# Patient Record
Sex: Male | Born: 1960 | Race: White | Hispanic: No | Marital: Married | Smoking: Never smoker
Health system: Southern US, Community
[De-identification: ages and names within clinical notes are randomized; demographics above are authoritative.]

## PROBLEM LIST (undated history)

## (undated) DIAGNOSIS — I10 Essential (primary) hypertension: Secondary | ICD-10-CM

## (undated) DIAGNOSIS — E119 Type 2 diabetes mellitus without complications: Secondary | ICD-10-CM

## (undated) DIAGNOSIS — M1990 Unspecified osteoarthritis, unspecified site: Secondary | ICD-10-CM

## (undated) DIAGNOSIS — M21371 Foot drop, right foot: Secondary | ICD-10-CM

## (undated) DIAGNOSIS — Z973 Presence of spectacles and contact lenses: Secondary | ICD-10-CM

## (undated) DIAGNOSIS — G629 Polyneuropathy, unspecified: Secondary | ICD-10-CM

## (undated) HISTORY — PX: EYE SURGERY: SHX253

## (undated) HISTORY — PX: COLONOSCOPY W/ BIOPSIES AND POLYPECTOMY: SHX1376

## (undated) HISTORY — PX: HAMMER TOE SURGERY: SHX385

## (undated) HISTORY — PX: MULTIPLE TOOTH EXTRACTIONS: SHX2053

---

## 1994-12-15 HISTORY — PX: KNEE ARTHROSCOPY: SUR90

## 2001-05-10 ENCOUNTER — Encounter: Admission: RE | Admit: 2001-05-10 | Discharge: 2001-05-10 | Payer: Self-pay | Admitting: Internal Medicine

## 2001-05-10 ENCOUNTER — Encounter: Payer: Self-pay | Admitting: Orthopedic Surgery

## 2004-03-19 ENCOUNTER — Encounter: Admission: RE | Admit: 2004-03-19 | Discharge: 2004-06-17 | Payer: Self-pay | Admitting: Family Medicine

## 2004-06-25 ENCOUNTER — Encounter: Admission: RE | Admit: 2004-06-25 | Discharge: 2004-06-25 | Payer: Self-pay | Admitting: Family Medicine

## 2007-12-16 HISTORY — PX: BACK SURGERY: SHX140

## 2008-05-17 ENCOUNTER — Encounter: Admission: RE | Admit: 2008-05-17 | Discharge: 2008-05-17 | Payer: Self-pay | Admitting: Family Medicine

## 2008-07-10 ENCOUNTER — Encounter: Admission: RE | Admit: 2008-07-10 | Discharge: 2008-07-10 | Payer: Self-pay | Admitting: Orthopaedic Surgery

## 2008-09-11 ENCOUNTER — Inpatient Hospital Stay (HOSPITAL_COMMUNITY): Admission: RE | Admit: 2008-09-11 | Discharge: 2008-09-14 | Payer: Self-pay | Admitting: Orthopaedic Surgery

## 2008-12-15 HISTORY — PX: CHOLECYSTECTOMY: SHX55

## 2010-04-20 ENCOUNTER — Inpatient Hospital Stay (HOSPITAL_COMMUNITY): Admission: EM | Admit: 2010-04-20 | Discharge: 2010-05-07 | Payer: Self-pay | Admitting: Emergency Medicine

## 2010-05-20 ENCOUNTER — Encounter: Admission: RE | Admit: 2010-05-20 | Discharge: 2010-05-20 | Payer: Self-pay | Admitting: General Surgery

## 2010-06-12 ENCOUNTER — Ambulatory Visit (HOSPITAL_COMMUNITY): Admission: RE | Admit: 2010-06-12 | Discharge: 2010-06-15 | Payer: Self-pay | Admitting: General Surgery

## 2010-06-12 ENCOUNTER — Encounter (INDEPENDENT_AMBULATORY_CARE_PROVIDER_SITE_OTHER): Payer: Self-pay | Admitting: General Surgery

## 2011-03-02 LAB — GLUCOSE, CAPILLARY
Glucose-Capillary: 102 mg/dL — ABNORMAL HIGH (ref 70–99)
Glucose-Capillary: 155 mg/dL — ABNORMAL HIGH (ref 70–99)
Glucose-Capillary: 182 mg/dL — ABNORMAL HIGH (ref 70–99)
Glucose-Capillary: 205 mg/dL — ABNORMAL HIGH (ref 70–99)
Glucose-Capillary: 210 mg/dL — ABNORMAL HIGH (ref 70–99)
Glucose-Capillary: 93 mg/dL (ref 70–99)
Glucose-Capillary: 127 mg/dL — ABNORMAL HIGH (ref 70–99)
Glucose-Capillary: 165 mg/dL — ABNORMAL HIGH (ref 70–99)
Glucose-Capillary: 168 mg/dL — ABNORMAL HIGH (ref 70–99)
Glucose-Capillary: 170 mg/dL — ABNORMAL HIGH (ref 70–99)
Glucose-Capillary: 176 mg/dL — ABNORMAL HIGH (ref 70–99)
Glucose-Capillary: 197 mg/dL — ABNORMAL HIGH (ref 70–99)
Glucose-Capillary: 200 mg/dL — ABNORMAL HIGH (ref 70–99)
Glucose-Capillary: 201 mg/dL — ABNORMAL HIGH (ref 70–99)
Glucose-Capillary: 214 mg/dL — ABNORMAL HIGH (ref 70–99)
Glucose-Capillary: 227 mg/dL — ABNORMAL HIGH (ref 70–99)
Glucose-Capillary: 229 mg/dL — ABNORMAL HIGH (ref 70–99)

## 2011-03-02 LAB — COMPREHENSIVE METABOLIC PANEL WITH GFR
ALT: 13 U/L (ref 0–53)
ALT: 16 U/L (ref 0–53)
AST: 19 U/L (ref 0–37)
AST: 21 U/L (ref 0–37)
Albumin: 3 g/dL — ABNORMAL LOW (ref 3.5–5.2)
Albumin: 3.5 g/dL (ref 3.5–5.2)
Alkaline Phosphatase: 101 U/L (ref 39–117)
Alkaline Phosphatase: 73 U/L (ref 39–117)
BUN: 11 mg/dL (ref 6–23)
BUN: 11 mg/dL (ref 6–23)
CO2: 24 meq/L (ref 19–32)
CO2: 27 meq/L (ref 19–32)
Calcium: 8.5 mg/dL (ref 8.4–10.5)
Calcium: 9.6 mg/dL (ref 8.4–10.5)
Chloride: 103 meq/L (ref 96–112)
Chloride: 99 meq/L (ref 96–112)
Creatinine, Ser: 0.91 mg/dL (ref 0.4–1.5)
Creatinine, Ser: 0.99 mg/dL (ref 0.4–1.5)
GFR calc Af Amer: >60 mL/min (ref >60)
GFR calc Af Amer: >60 mL/min (ref >60)
GFR calc non Af Amer: >60 mL/min (ref >60)
GFR calc non Af Amer: >60 mL/min (ref >60)
Glucose, Bld: 149 mg/dL — ABNORMAL HIGH (ref 70–99)
Glucose, Bld: 168 mg/dL — ABNORMAL HIGH (ref 70–99)
Potassium: 3.5 meq/L (ref 3.5–5.1)
Potassium: 4 meq/L (ref 3.5–5.1)
Sodium: 131 meq/L — ABNORMAL LOW (ref 135–145)
Sodium: 139 meq/L (ref 135–145)
Total Bilirubin: 0.6 mg/dL (ref 0.3–1.2)
Total Bilirubin: 1.1 mg/dL (ref 0.3–1.2)
Total Protein: 7 g/dL (ref 6.0–8.3)
Total Protein: 7.7 g/dL (ref 6.0–8.3)

## 2011-03-02 LAB — DIFFERENTIAL
Basophils Absolute: 0.1 K/uL (ref 0.0–0.1)
Basophils Relative: 1 % (ref 0–1)
Eosinophils Absolute: 0.5 K/uL (ref 0.0–0.7)
Eosinophils Relative: 5 % (ref 0–5)
Lymphocytes Relative: 19 % (ref 12–46)
Lymphs Abs: 1.9 K/uL (ref 0.7–4.0)
Monocytes Absolute: 0.8 K/uL (ref 0.1–1.0)
Monocytes Relative: 9 % (ref 3–12)
Neutro Abs: 6.4 K/uL (ref 1.7–7.7)
Neutrophils Relative %: 66 % (ref 43–77)

## 2011-03-02 LAB — CBC
HCT: 36.3 % — ABNORMAL LOW (ref 39.0–52.0)
HCT: 40.7 % (ref 39.0–52.0)
Hemoglobin: 12.1 g/dL — ABNORMAL LOW (ref 13.0–17.0)
Hemoglobin: 12.6 g/dL — ABNORMAL LOW (ref 13.0–17.0)
Hemoglobin: 13.8 g/dL (ref 13.0–17.0)
MCH: 31.7 pg (ref 26.0–34.0)
MCH: 31.1 pg (ref 26.0–34.0)
MCH: 31.5 pg (ref 26.0–34.0)
MCHC: 34.6 g/dL (ref 30.0–36.0)
MCHC: 34 g/dL (ref 30.0–36.0)
MCV: 91.6 fL (ref 78.0–100.0)
MCV: 91.2 fL (ref 78.0–100.0)
MCV: 91.6 fL (ref 78.0–100.0)
Platelets: 245 K/uL (ref 150–400)
Platelets: 280 10*3/uL (ref 150–400)
Platelets: 305 K/uL (ref 150–400)
RBC: 3.87 MIL/uL — ABNORMAL LOW (ref 4.22–5.81)
RBC: 3.96 MIL/uL — ABNORMAL LOW (ref 4.22–5.81)
RBC: 4.44 MIL/uL (ref 4.22–5.81)
RDW: 16.1 % — ABNORMAL HIGH (ref 11.5–15.5)
RDW: 16.1 % — ABNORMAL HIGH (ref 11.5–15.5)
RDW: 15.7 % — ABNORMAL HIGH (ref 11.5–15.5)
RDW: 16.1 % — ABNORMAL HIGH (ref 11.5–15.5)
WBC: 11.1 K/uL — ABNORMAL HIGH (ref 4.0–10.5)
WBC: 12.6 10*3/uL — ABNORMAL HIGH (ref 4.0–10.5)
WBC: 9.6 K/uL (ref 4.0–10.5)

## 2011-03-02 LAB — COMPREHENSIVE METABOLIC PANEL
ALT: 12 U/L (ref 0–53)
Albumin: 2.8 g/dL — ABNORMAL LOW (ref 3.5–5.2)
Calcium: 8.5 mg/dL (ref 8.4–10.5)
Creatinine, Ser: 0.89 mg/dL (ref 0.4–1.5)
GFR calc Af Amer: >60 mL/min (ref >60)
Glucose, Bld: 154 mg/dL — ABNORMAL HIGH (ref 70–99)
Sodium: 136 mEq/L (ref 135–145)
Total Protein: 6.8 g/dL (ref 6.0–8.3)

## 2011-03-03 LAB — GLUCOSE, CAPILLARY
Glucose-Capillary: 101 mg/dL — ABNORMAL HIGH (ref 70–99)
Glucose-Capillary: 115 mg/dL — ABNORMAL HIGH (ref 70–99)
Glucose-Capillary: 121 mg/dL — ABNORMAL HIGH (ref 70–99)
Glucose-Capillary: 129 mg/dL — ABNORMAL HIGH (ref 70–99)
Glucose-Capillary: 136 mg/dL — ABNORMAL HIGH (ref 70–99)
Glucose-Capillary: 139 mg/dL — ABNORMAL HIGH (ref 70–99)
Glucose-Capillary: 147 mg/dL — ABNORMAL HIGH (ref 70–99)
Glucose-Capillary: 150 mg/dL — ABNORMAL HIGH (ref 70–99)
Glucose-Capillary: 170 mg/dL — ABNORMAL HIGH (ref 70–99)
Glucose-Capillary: 173 mg/dL — ABNORMAL HIGH (ref 70–99)
Glucose-Capillary: 183 mg/dL — ABNORMAL HIGH (ref 70–99)
Glucose-Capillary: 66 mg/dL — ABNORMAL LOW (ref 70–99)
Glucose-Capillary: 66 mg/dL — ABNORMAL LOW (ref 70–99)
Glucose-Capillary: 72 mg/dL (ref 70–99)
Glucose-Capillary: 74 mg/dL (ref 70–99)
Glucose-Capillary: 76 mg/dL (ref 70–99)
Glucose-Capillary: 79 mg/dL (ref 70–99)
Glucose-Capillary: 98 mg/dL (ref 70–99)
Glucose-Capillary: 98 mg/dL (ref 70–99)

## 2011-03-03 LAB — CBC
HCT: 28.4 % — ABNORMAL LOW (ref 39.0–52.0)
HCT: 29.6 % — ABNORMAL LOW (ref 39.0–52.0)
HCT: 29.8 % — ABNORMAL LOW (ref 39.0–52.0)
HCT: 30.3 % — ABNORMAL LOW (ref 39.0–52.0)
HCT: 30.6 % — ABNORMAL LOW (ref 39.0–52.0)
Hemoglobin: 10.6 g/dL — ABNORMAL LOW (ref 13.0–17.0)
Hemoglobin: 10.7 g/dL — ABNORMAL LOW (ref 13.0–17.0)
Hemoglobin: 10.7 g/dL — ABNORMAL LOW (ref 13.0–17.0)
Hemoglobin: 9.8 g/dL — ABNORMAL LOW (ref 13.0–17.0)
MCHC: 34.1 g/dL (ref 30.0–36.0)
MCHC: 34.8 g/dL (ref 30.0–36.0)
MCV: 91.4 fL (ref 78.0–100.0)
MCV: 91.9 fL (ref 78.0–100.0)
MCV: 92 fL (ref 78.0–100.0)
MCV: 92.8 fL (ref 78.0–100.0)
MCV: 93.1 fL (ref 78.0–100.0)
Platelets: 348 10*3/uL (ref 150–400)
Platelets: 444 10*3/uL — ABNORMAL HIGH (ref 150–400)
Platelets: 444 10*3/uL — ABNORMAL HIGH (ref 150–400)
Platelets: 448 10*3/uL — ABNORMAL HIGH (ref 150–400)
Platelets: 504 10*3/uL — ABNORMAL HIGH (ref 150–400)
RBC: 3.09 MIL/uL — ABNORMAL LOW (ref 4.22–5.81)
RBC: 3.3 MIL/uL — ABNORMAL LOW (ref 4.22–5.81)
RBC: 3.39 MIL/uL — ABNORMAL LOW (ref 4.22–5.81)
RDW: 14.1 % (ref 11.5–15.5)
RDW: 14.1 % (ref 11.5–15.5)
RDW: 14.2 % (ref 11.5–15.5)
RDW: 14.3 % (ref 11.5–15.5)
WBC: 13.1 10*3/uL — ABNORMAL HIGH (ref 4.0–10.5)
WBC: 15.9 10*3/uL — ABNORMAL HIGH (ref 4.0–10.5)
WBC: 22.5 10*3/uL — ABNORMAL HIGH (ref 4.0–10.5)
WBC: 24.9 10*3/uL — ABNORMAL HIGH (ref 4.0–10.5)
WBC: 25.2 10*3/uL — ABNORMAL HIGH (ref 4.0–10.5)

## 2011-03-03 LAB — ANAEROBIC CULTURE

## 2011-03-03 LAB — BASIC METABOLIC PANEL
BUN: 2 mg/dL — ABNORMAL LOW (ref 6–23)
Calcium: 8 mg/dL — ABNORMAL LOW (ref 8.4–10.5)
Creatinine, Ser: 0.77 mg/dL (ref 0.4–1.5)
GFR calc non Af Amer: >60 mL/min (ref >60)
Glucose, Bld: 87 mg/dL (ref 70–99)
Potassium: 3.5 mEq/L (ref 3.5–5.1)

## 2011-03-03 LAB — CULTURE, ROUTINE-ABSCESS: Culture: NO GROWTH

## 2011-03-03 LAB — DIFFERENTIAL
Basophils Absolute: 0.2 10*3/uL — ABNORMAL HIGH (ref 0.0–0.1)
Eosinophils Absolute: 0.3 10*3/uL (ref 0.0–0.7)
Eosinophils Relative: 3 % (ref 0–5)
Lymphs Abs: 1.5 10*3/uL (ref 0.7–4.0)
Monocytes Absolute: 1.1 10*3/uL — ABNORMAL HIGH (ref 0.1–1.0)

## 2011-03-04 LAB — GLUCOSE, CAPILLARY
Glucose-Capillary: 139 mg/dL — ABNORMAL HIGH (ref 70–99)
Glucose-Capillary: 162 mg/dL — ABNORMAL HIGH (ref 70–99)
Glucose-Capillary: 163 mg/dL — ABNORMAL HIGH (ref 70–99)
Glucose-Capillary: 179 mg/dL — ABNORMAL HIGH (ref 70–99)
Glucose-Capillary: 180 mg/dL — ABNORMAL HIGH (ref 70–99)
Glucose-Capillary: 182 mg/dL — ABNORMAL HIGH (ref 70–99)
Glucose-Capillary: 188 mg/dL — ABNORMAL HIGH (ref 70–99)
Glucose-Capillary: 188 mg/dL — ABNORMAL HIGH (ref 70–99)
Glucose-Capillary: 189 mg/dL — ABNORMAL HIGH (ref 70–99)
Glucose-Capillary: 190 mg/dL — ABNORMAL HIGH (ref 70–99)
Glucose-Capillary: 192 mg/dL — ABNORMAL HIGH (ref 70–99)
Glucose-Capillary: 193 mg/dL — ABNORMAL HIGH (ref 70–99)
Glucose-Capillary: 207 mg/dL — ABNORMAL HIGH (ref 70–99)
Glucose-Capillary: 209 mg/dL — ABNORMAL HIGH (ref 70–99)
Glucose-Capillary: 214 mg/dL — ABNORMAL HIGH (ref 70–99)
Glucose-Capillary: 272 mg/dL — ABNORMAL HIGH (ref 70–99)
Glucose-Capillary: 294 mg/dL — ABNORMAL HIGH (ref 70–99)
Glucose-Capillary: 302 mg/dL — ABNORMAL HIGH (ref 70–99)
Glucose-Capillary: 303 mg/dL — ABNORMAL HIGH (ref 70–99)
Glucose-Capillary: 343 mg/dL — ABNORMAL HIGH (ref 70–99)
Glucose-Capillary: 375 mg/dL — ABNORMAL HIGH (ref 70–99)

## 2011-03-04 LAB — CBC
HCT: 32.7 % — ABNORMAL LOW (ref 39.0–52.0)
HCT: 33.6 % — ABNORMAL LOW (ref 39.0–52.0)
Hemoglobin: 11.1 g/dL — ABNORMAL LOW (ref 13.0–17.0)
Hemoglobin: 11.4 g/dL — ABNORMAL LOW (ref 13.0–17.0)
Hemoglobin: 11.5 g/dL — ABNORMAL LOW (ref 13.0–17.0)
Hemoglobin: 11.9 g/dL — ABNORMAL LOW (ref 13.0–17.0)
Hemoglobin: 13 g/dL (ref 13.0–17.0)
Hemoglobin: 13 g/dL (ref 13.0–17.0)
MCHC: 34 g/dL (ref 30.0–36.0)
MCHC: 34.3 g/dL (ref 30.0–36.0)
MCHC: 34.3 g/dL (ref 30.0–36.0)
MCHC: 34.5 g/dL (ref 30.0–36.0)
MCHC: 34.7 g/dL (ref 30.0–36.0)
MCHC: 34.8 g/dL (ref 30.0–36.0)
MCV: 92.3 fL (ref 78.0–100.0)
MCV: 92.8 fL (ref 78.0–100.0)
MCV: 93.1 fL (ref 78.0–100.0)
MCV: 93.4 fL (ref 78.0–100.0)
MCV: 94.3 fL (ref 78.0–100.0)
MCV: 94.5 fL (ref 78.0–100.0)
Platelets: 297 10*3/uL (ref 150–400)
Platelets: 669 10*3/uL — ABNORMAL HIGH (ref 150–400)
RBC: 3.6 MIL/uL — ABNORMAL LOW (ref 4.22–5.81)
RBC: 3.71 MIL/uL — ABNORMAL LOW (ref 4.22–5.81)
RBC: 3.77 MIL/uL — ABNORMAL LOW (ref 4.22–5.81)
RBC: 3.98 MIL/uL — ABNORMAL LOW (ref 4.22–5.81)
RBC: 4.02 MIL/uL — ABNORMAL LOW (ref 4.22–5.81)
RBC: 4.35 MIL/uL (ref 4.22–5.81)
RDW: 13.5 % (ref 11.5–15.5)
RDW: 13.7 % (ref 11.5–15.5)
RDW: 13.8 % (ref 11.5–15.5)
RDW: 13.9 % (ref 11.5–15.5)
WBC: 25.4 10*3/uL — ABNORMAL HIGH (ref 4.0–10.5)
WBC: 38.2 10*3/uL — ABNORMAL HIGH (ref 4.0–10.5)
WBC: 44.5 10*3/uL — ABNORMAL HIGH (ref 4.0–10.5)

## 2011-03-04 LAB — DIFFERENTIAL
Basophils Absolute: 0.4 10*3/uL — ABNORMAL HIGH (ref 0.0–0.1)
Eosinophils Absolute: 0 10*3/uL (ref 0.0–0.7)
Lymphs Abs: 1.1 10*3/uL (ref 0.7–4.0)
Neutro Abs: 35.2 10*3/uL — ABNORMAL HIGH (ref 1.7–7.7)
WBC Morphology: INCREASED

## 2011-03-04 LAB — COMPREHENSIVE METABOLIC PANEL
ALT: 19 U/L (ref 0–53)
ALT: 24 U/L (ref 0–53)
ALT: 26 U/L (ref 0–53)
ALT: 29 U/L (ref 0–53)
AST: 18 U/L (ref 0–37)
AST: 26 U/L (ref 0–37)
AST: 30 U/L (ref 0–37)
Alkaline Phosphatase: 113 U/L (ref 39–117)
Alkaline Phosphatase: 125 U/L — ABNORMAL HIGH (ref 39–117)
BUN: 25 mg/dL — ABNORMAL HIGH (ref 6–23)
CO2: 21 mEq/L (ref 19–32)
CO2: 24 mEq/L (ref 19–32)
CO2: 24 mEq/L (ref 19–32)
CO2: 24 mEq/L (ref 19–32)
Calcium: 8.3 mg/dL — ABNORMAL LOW (ref 8.4–10.5)
Calcium: 8.3 mg/dL — ABNORMAL LOW (ref 8.4–10.5)
Calcium: 8.4 mg/dL (ref 8.4–10.5)
Chloride: 94 mEq/L — ABNORMAL LOW (ref 96–112)
Chloride: 95 mEq/L — ABNORMAL LOW (ref 96–112)
Creatinine, Ser: 1.27 mg/dL (ref 0.4–1.5)
Creatinine, Ser: 2.59 mg/dL — ABNORMAL HIGH (ref 0.4–1.5)
GFR calc Af Amer: 32 mL/min — ABNORMAL LOW (ref >60)
GFR calc Af Amer: 47 mL/min — ABNORMAL LOW (ref >60)
GFR calc Af Amer: >60 mL/min (ref >60)
GFR calc non Af Amer: 24 mL/min — ABNORMAL LOW (ref >60)
GFR calc non Af Amer: 26 mL/min — ABNORMAL LOW (ref >60)
GFR calc non Af Amer: >60 mL/min (ref >60)
GFR calc non Af Amer: >60 mL/min (ref >60)
Glucose, Bld: 179 mg/dL — ABNORMAL HIGH (ref 70–99)
Glucose, Bld: 212 mg/dL — ABNORMAL HIGH (ref 70–99)
Glucose, Bld: 328 mg/dL — ABNORMAL HIGH (ref 70–99)
Glucose, Bld: 433 mg/dL — ABNORMAL HIGH (ref 70–99)
Potassium: 3.8 mEq/L (ref 3.5–5.1)
Potassium: 4.5 mEq/L (ref 3.5–5.1)
Sodium: 131 mEq/L — ABNORMAL LOW (ref 135–145)
Sodium: 131 mEq/L — ABNORMAL LOW (ref 135–145)
Sodium: 132 mEq/L — ABNORMAL LOW (ref 135–145)
Sodium: 137 mEq/L (ref 135–145)
Sodium: 139 mEq/L (ref 135–145)
Total Bilirubin: 1.3 mg/dL — ABNORMAL HIGH (ref 0.3–1.2)
Total Protein: 6.4 g/dL (ref 6.0–8.3)
Total Protein: 6.6 g/dL (ref 6.0–8.3)
Total Protein: 7.1 g/dL (ref 6.0–8.3)
Total Protein: 7.2 g/dL (ref 6.0–8.3)

## 2011-03-04 LAB — BASIC METABOLIC PANEL
BUN: 7 mg/dL (ref 6–23)
CO2: 24 mEq/L (ref 19–32)
CO2: 24 mEq/L (ref 19–32)
CO2: 25 mEq/L (ref 19–32)
Chloride: 101 mEq/L (ref 96–112)
Chloride: 104 mEq/L (ref 96–112)
Chloride: 105 mEq/L (ref 96–112)
Chloride: 106 mEq/L (ref 96–112)
Creatinine, Ser: 0.9 mg/dL (ref 0.4–1.5)
Creatinine, Ser: 1 mg/dL (ref 0.4–1.5)
Creatinine, Ser: 1.95 mg/dL — ABNORMAL HIGH (ref 0.4–1.5)
GFR calc Af Amer: 44 mL/min — ABNORMAL LOW (ref >60)
GFR calc Af Amer: >60 mL/min (ref >60)
GFR calc non Af Amer: >60 mL/min (ref >60)
GFR calc non Af Amer: >60 mL/min (ref >60)
Glucose, Bld: 161 mg/dL — ABNORMAL HIGH (ref 70–99)
Glucose, Bld: 205 mg/dL — ABNORMAL HIGH (ref 70–99)
Potassium: 3.4 mEq/L — ABNORMAL LOW (ref 3.5–5.1)
Potassium: 3.4 mEq/L — ABNORMAL LOW (ref 3.5–5.1)
Sodium: 133 mEq/L — ABNORMAL LOW (ref 135–145)
Sodium: 134 mEq/L — ABNORMAL LOW (ref 135–145)
Sodium: 137 mEq/L (ref 135–145)

## 2011-03-04 LAB — CLOSTRIDIUM DIFFICILE EIA
C difficile Toxins A+B, EIA: NEGATIVE
C difficile Toxins A+B, EIA: NEGATIVE

## 2011-03-04 LAB — URINALYSIS, ROUTINE W REFLEX MICROSCOPIC
Glucose, UA: 500 mg/dL — AB
Hgb urine dipstick: NEGATIVE
Ketones, ur: 15 mg/dL — AB
Leukocytes, UA: NEGATIVE
Protein, ur: 100 mg/dL — AB
Protein, ur: 100 mg/dL — AB
Urobilinogen, UA: 1 mg/dL (ref 0.0–1.0)
Urobilinogen, UA: 1 mg/dL (ref 0.0–1.0)

## 2011-03-04 LAB — URINE MICROSCOPIC-ADD ON

## 2011-03-04 LAB — HEMOGLOBIN A1C
Hgb A1c MFr Bld: 7.8 % — ABNORMAL HIGH (ref <5.7)
Mean Plasma Glucose: 177 mg/dL — ABNORMAL HIGH (ref <117)

## 2011-03-04 LAB — ANAEROBIC CULTURE: Gram Stain: NONE SEEN

## 2011-03-04 LAB — MAGNESIUM
Magnesium: 1.4 mg/dL — ABNORMAL LOW (ref 1.5–2.5)
Magnesium: 1.6 mg/dL (ref 1.5–2.5)

## 2011-03-04 LAB — URINE CULTURE

## 2011-03-04 LAB — CULTURE, ROUTINE-ABSCESS

## 2011-04-29 NOTE — Op Note (Signed)
[name], [name]                 ACCOUNT NO.:  [id number]   MEDICAL RECORD NO.:  [id number]          PATIENT TYPE:  INP   LOCATION:  5036                         FACILITY:  [facility]   PHYSICIAN:  Mark C. Ophelia Charter, M.D.    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   PROCEDURE:  L3-4 left T-lift with pedicle instrumentation, interbody  bone 11-mm PEEK cage with local bone, bilateral gutter fusion, pedicle  instrumentation, and Vitoss with iliac crest aspirate bone marrow.   SURGEON:  Mark C. Ophelia Charter, MD   ASSISTANT:  Wende Neighbors, PA   ANESTHESIA:  GOT.   ESTIMATED BLOOD LOSS:  300 mL.   DRAINS:  None.   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  He had a large HNP central with foraminal  severe compression bilaterally and the facet spurring.   PROCEDURE:  After induction of general anesthesia, the patient was  placed prone on the spine frame.  With careful padding and positioning  in prone position, rolled doubly yellow eggcrate pads underneath the  shoulders, with the hip pads and iliac crest pads.  Foley catheter had  been placed and foot pumpers were used.  The area posteriorly on the  thigh was noted which was an old boil.  No purulence was expressed;  however, the patient received Ancef prophylactically.  It was elected to  go ahead and give him a dose of 1.5 vancomycin possibly that he may have  had exposure of MRSA at some point in the past with the boil.  Back was  prepped with DuraPrep and scrubbed with  towels.  Betadine bio drape  application.  Cross-table lateral x-ray with needle localization with 2  needles with the inferior needle at the level of L3-4.  Incision was  made in the midline, subperiosteal dissection was performed at the  facets.  C-arm was draped and brought in and Kochers were placed for the  planned level and decompression adjusted and spot fluoro picture showing  confirmation.  Posterior elements were removed and laminectomy was  performed at L3, top of L4 was taken.  Piece of the bone were  meticulously cleaned and cut into small pieces for later bone graft with  the T-lift.  There was narrow pedicles as expected and extremely tight  compression with thick chunks of ligament and overhanging bone removed.  Once the dura was rounded up and the area of compression were improved,  dissection now on the left side with spot fluoro picture confirming the  level of the disk, removing the facets on the left, and some coagulation  of epidural veins.  There was large __________ endplate spurs and a  large  bulging disk and large chunks of ligament were decompressed, all  was scarred down to the dura and abstains and operative microscope was  used with microdissection until the disk material was pushed into the  disk space gently and then removed.  This allowed the dura to move more  freely almost to the midline.  Thick chunks of ligaments were removed,  ring curettes, right and left angled curettes, endplate rasps, large  wide curettes, and straight curettes were all used for preparation of  the endplates.  After endplates were bleeding, bone was meticulously  packed anteriorly and then tapped down and packed with a hammer and  impacted with a 7-mm trial.  Trials 7, 9, and 11 mm were used and 11-mm  lordotic PEEK cage was packed to the bone in the middle of the cage and  then inserted.  On lateral x-ray, it was in excellent position.  There  was abundant bone anterior to the  cage.  On AP fluoroscopic picture, the  cage appeared to be a little bit to the right side; however, with direct  visualization it was still hanging out on the left side with portion of  the cage exposed lateral to the dura.  Nerve root just above the level  of disk was well decompressed.  Pedicle screws were then placed, 6.5 mm  using the Bi[employer] Polaris system.  Spike pedicle starter was used  followed by fluoroscopic check then the joystick pedicle filler,  followed by fluoroscopic spot picture, followed by a ball-tip probe to  probe the pedicle, followed by tapping repeat ball-tip probing, and then  placement of the screw then repeat fluoroscopic picture confirming good  position.  This was repeated all 4 times for all screws.  A 45-mm  titanium rod was selected, tightened down on the T-lift side first with  good compression on the opposite side.  AP and lateral spot fluoro  pictures confirmed good position of the rod.  Rod sticking out least 1  mm on all lance.  There was good compression.  The endplates were  parallel, cage was in good position, and bone graft was visualized in  the interbody.  Prior to placement of the screws, transverse processes  were decorticated.  Iliac aspirate was performed from stab 15 incision  on the right posterior iliac crest aspirating 10 mL moving the tip round  on the bone to obtain maximal number of ostial progenitor cells.  Vitoss  10 mL strip was selected, cut in half, packed on both gutters for  intertransverse process fusion with the remaining chips of the patient's  own local bone.  Instrument count and needle count was correct and  padding count was correct.  Deep fascia was closed with 0 Vicryl, 2-0  Vicryl in subcutaneous tissue, 4-0 Vicryl subcuticular closure, postop  dressing, and transferred to recovery room.      Mark C. Ophelia Charter, M.D.  Electronically Signed     MCY/MEDQ  D:  09/11/2008  T:  09/12/2008  Job:  [id number]

## 2011-05-02 NOTE — Discharge Summary (Signed)
[name], [name]                 ACCOUNT NO.:  [id number]   MEDICAL RECORD NO.:  [id number]          PATIENT TYPE:  INP   LOCATION:  5036                         FACILITY:  [facility]   PHYSICIAN:  Mark C. Ophelia Charter, M.D.    DATE OF BIRTH:  Apr 23, 1961   DATE OF ADMISSION:  09/11/2008  DATE OF DISCHARGE:  09/14/2008                               DISCHARGE SUMMARY   ADMISSION DIAGNOSES:  1. 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  2. Diabetes mellitus.  3. Hypertension.  4. Dyslipidemia.   DISCHARGE DIAGNOSES:  1. 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  2. Diabetes mellitus.  3. Hypertension.  4. Dyslipidemia.  5. Mild posthemorrhagic anemia.  6. Elevated hemoglobin A1c at 6.3.   PROCEDURE:  On September 11, 2008, the patient underwent left L3-4  transforaminal lumbar interbody fusion with pedicle screw and rod  instrumentation with iliac crest bone marrow aspirate.  This was  performed by Dr. Ophelia Charter, assisted by Maud Deed, PA-C under general  anesthesia.   CONSULTATIONS:  None.   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  He has undergone  evaluation with radiograph showing congenitally short pedicles with  acquired and congenital stenosis combined with myelographic block at the  L3-4 level and mild stenosis at L4-5.  Also, he has a large herniated  nucleus pulposus central and foraminal with severe compression  bilaterally and facets spurring at the L3-4 level.  As his symptoms are  worsening and he is getting no relief with conservative treatment, it  was felt he would require surgical intervention and was admitted for the  procedure as stated above.   BRIEF HOSPITAL COURSE:  The patient tolerated the procedure under  general anesthesia.  Postoperatively, neurovascular  motor function was  noted to be intact.  The patient was fitted with the brace and started  physical therapy on the first postoperative day.  He was allowed  weightbearing as tolerated, utilizing a walker, and was instructed to  wear the brace at all times when out of bed.  Occupational therapy saw  the patient and assisted with the durable medical equipment and  recommended home health occupational therapy.  Physical therapy worked  with the patient daily and he was able to ambulate as much as 120 feet  utilizing a walker during the hospital stay.  Dressing change was done  daily.  He did have a small amount of serous drainage from his wound.  This was dressed with Mepilex and he was given instructions to keep the  wound dry and clean until all drainage had resolved.  The patient was  able to go up and down stairs.  Prior to discharge, he was taking a  regular diet.  He was voiding without difficulty.  The patient had one  temperature spike to 101.3 that returned to afebrile status.  He was  instructed in coughing and deep breathing.  The patient's blood sugars  were monitored and he was on sliding scale insulin and returned to his  usual home medications prior to discharge.   PERTINENT LABORATORY VALUES:  Admission CBC with hemoglobin 14.9,  hematocrit 43.6.  At discharge, hemoglobin 12.6, hematocrit 36.3.  Admission blood glucose was 204.  Blood sugar was monitored throughout  the hospital stay with values ranging from 304 to 131.  It was noted  that his hemoglobin A1c was elevated at 6.3.  The patient was advised to  see his primary care physician in regards to adjustments in his  medications for his diabetes and also instructed in low carbohydrate  diet.  At the time of discharge, he was afebrile, vital signs were  stable.  He was discharged to his home.   PLAN:  Arrangements were made for home health physical therapy and  occupational therapy as well as durable medical equipment.   The patient  was instructed to resume his home medications as taken prior to  admission and was given a medication reconciliation form in regards to  this.  He was asked to discontinue the use of ibuprofen.  Medications  include Percocet 5/325 one to two every for 6 hours as needed for pain,  Robaxin 500 mg one every 8 hours as needed for spasm.  He will use over-  the-counter stool softeners daily and suppository laxative as needed for  constipation.  He is instructed to wear his brace at all times when out  of bed.  He will call if his temperature was greater than 101.  He will  continue to use his incentive spirometer at home.  Ice packs to be used  to his low back as needed.  He will keep his wound dry and clean until  all drainage had subsided.  The patient will follow up with Dr. Ophelia Charter in  1 week.  He will continue on a diabetic diet and he was encouraged to  drink as much as 60 ounces of water per day.  He will call if there are  questions or concerns prior to his return office visit.  All questions  encouraged and answered.      Wende Neighbors, P.A.      Mark C. Ophelia Charter, M.D.  Electronically Signed    SMV/MEDQ  D:  10/12/2008  T:  10/12/2008  Job:  [id number]

## 2011-05-23 ENCOUNTER — Other Ambulatory Visit: Payer: Self-pay | Admitting: Gastroenterology

## 2011-09-15 LAB — HEMOGLOBIN A1C
Hgb A1c MFr Bld: 6.3 — ABNORMAL HIGH
Hgb A1c MFr Bld: 6.3 — ABNORMAL HIGH
Mean Plasma Glucose: 134
Mean Plasma Glucose: 134

## 2011-09-15 LAB — CBC
HCT: 43.6
Hemoglobin: 14.9
Platelets: 310
WBC: 10.6 — ABNORMAL HIGH

## 2011-09-15 LAB — BASIC METABOLIC PANEL
BUN: 13
Chloride: 100
GFR calc Af Amer: >60
GFR calc non Af Amer: >60
Potassium: 3.8
Sodium: 133 — ABNORMAL LOW

## 2011-09-15 LAB — GLUCOSE, CAPILLARY
Glucose-Capillary: 161 — ABNORMAL HIGH
Glucose-Capillary: 165 — ABNORMAL HIGH
Glucose-Capillary: 191 — ABNORMAL HIGH
Glucose-Capillary: 196 — ABNORMAL HIGH
Glucose-Capillary: 198 — ABNORMAL HIGH
Glucose-Capillary: 227 — ABNORMAL HIGH
Glucose-Capillary: 232 — ABNORMAL HIGH

## 2011-09-15 LAB — COMPREHENSIVE METABOLIC PANEL
ALT: 32
Albumin: 4.3
Alkaline Phosphatase: 99
BUN: 12
Chloride: 100
Glucose, Bld: 204 — ABNORMAL HIGH
Potassium: 3.9
Sodium: 137
Total Bilirubin: 0.8
Total Protein: 7.4

## 2011-09-15 LAB — HEMOGLOBIN AND HEMATOCRIT, BLOOD: HCT: 35.7 — ABNORMAL LOW

## 2011-09-15 LAB — DIFFERENTIAL
Basophils Absolute: 0.1
Basophils Relative: 1
Eosinophils Absolute: 0.9 — ABNORMAL HIGH
Monocytes Absolute: 1.2 — ABNORMAL HIGH
Monocytes Relative: 12
Neutro Abs: 6.2

## 2011-09-15 LAB — URINALYSIS, ROUTINE W REFLEX MICROSCOPIC
Bilirubin Urine: NEGATIVE
Glucose, UA: 500 — AB
Hgb urine dipstick: NEGATIVE
Ketones, ur: NEGATIVE
Specific Gravity, Urine: 1.008
pH: 6

## 2011-09-15 LAB — TYPE AND SCREEN: Antibody Screen: NEGATIVE

## 2011-09-15 LAB — ABO/RH: ABO/RH(D): O NEG

## 2011-09-15 LAB — POCT I-STAT GLUCOSE: Operator id: 177011

## 2011-09-15 LAB — APTT: aPTT: 26

## 2011-09-16 LAB — GLUCOSE, CAPILLARY: Glucose-Capillary: 219 — ABNORMAL HIGH

## 2011-10-20 ENCOUNTER — Ambulatory Visit: Payer: Self-pay

## 2011-10-20 ENCOUNTER — Other Ambulatory Visit: Payer: Self-pay | Admitting: Occupational Medicine

## 2011-10-20 DIAGNOSIS — M25569 Pain in unspecified knee: Secondary | ICD-10-CM

## 2013-04-25 ENCOUNTER — Other Ambulatory Visit: Payer: Self-pay | Admitting: Family Medicine

## 2013-04-25 DIAGNOSIS — M25511 Pain in right shoulder: Secondary | ICD-10-CM

## 2013-05-01 ENCOUNTER — Other Ambulatory Visit: Payer: Self-pay

## 2013-05-27 ENCOUNTER — Other Ambulatory Visit: Payer: Self-pay

## 2013-06-10 ENCOUNTER — Inpatient Hospital Stay: Admission: RE | Admit: 2013-06-10 | Payer: Self-pay | Source: Ambulatory Visit

## 2015-06-25 ENCOUNTER — Other Ambulatory Visit (HOSPITAL_COMMUNITY): Payer: Self-pay | Admitting: Orthopaedic Surgery

## 2015-07-02 ENCOUNTER — Encounter (HOSPITAL_COMMUNITY): Payer: Self-pay

## 2015-07-02 ENCOUNTER — Encounter (HOSPITAL_COMMUNITY)
Admission: RE | Admit: 2015-07-02 | Discharge: 2015-07-02 | Disposition: A | Discharge status: Home / Self Care | Payer: 59 | Source: Ambulatory Visit | Attending: Orthopaedic Surgery | Admitting: Orthopaedic Surgery

## 2015-07-02 ENCOUNTER — Ambulatory Visit (HOSPITAL_COMMUNITY)
Admission: RE | Admit: 2015-07-02 | Discharge: 2015-07-02 | Disposition: A | Discharge status: Home / Self Care | Payer: 59 | Source: Ambulatory Visit | Attending: Orthopaedic Surgery | Admitting: Orthopaedic Surgery

## 2015-07-02 DIAGNOSIS — Z01812 Encounter for preprocedural laboratory examination: Secondary | ICD-10-CM | POA: Insufficient documentation

## 2015-07-02 DIAGNOSIS — I10 Essential (primary) hypertension: Secondary | ICD-10-CM | POA: Insufficient documentation

## 2015-07-02 DIAGNOSIS — M179 Osteoarthritis of knee, unspecified: Secondary | ICD-10-CM | POA: Diagnosis not present

## 2015-07-02 DIAGNOSIS — R001 Bradycardia, unspecified: Secondary | ICD-10-CM | POA: Diagnosis not present

## 2015-07-02 DIAGNOSIS — Z01818 Encounter for other preprocedural examination: Secondary | ICD-10-CM | POA: Diagnosis present

## 2015-07-02 DIAGNOSIS — M1710 Unilateral primary osteoarthritis, unspecified knee: Secondary | ICD-10-CM

## 2015-07-02 HISTORY — DX: Type 2 diabetes mellitus without complications: E11.9

## 2015-07-02 HISTORY — DX: Essential (primary) hypertension: I10

## 2015-07-02 LAB — COMPREHENSIVE METABOLIC PANEL
ALBUMIN: 4.1 g/dL (ref 3.5–5.0)
ALT: 24 U/L (ref 17–63)
ANION GAP: 9 (ref 5–15)
AST: 27 U/L (ref 15–41)
Alkaline Phosphatase: 84 U/L (ref 38–126)
BILIRUBIN TOTAL: 0.7 mg/dL (ref 0.3–1.2)
BUN: 13 mg/dL (ref 6–20)
CHLORIDE: 105 mmol/L (ref 101–111)
CO2: 27 mmol/L (ref 22–32)
CREATININE: 1.02 mg/dL (ref 0.61–1.24)
Calcium: 9.4 mg/dL (ref 8.9–10.3)
GFR calc Af Amer: >60 mL/min (ref >60)
GFR calc non Af Amer: >60 mL/min (ref >60)
GLUCOSE: 95 mg/dL (ref 65–99)
Potassium: 3.9 mmol/L (ref 3.5–5.1)
SODIUM: 141 mmol/L (ref 135–145)
TOTAL PROTEIN: 7.1 g/dL (ref 6.5–8.1)

## 2015-07-02 LAB — URINALYSIS, ROUTINE W REFLEX MICROSCOPIC
BILIRUBIN URINE: NEGATIVE
GLUCOSE, UA: 250 mg/dL — AB
Hgb urine dipstick: NEGATIVE
Ketones, ur: NEGATIVE mg/dL
LEUKOCYTES UA: NEGATIVE
NITRITE: NEGATIVE
Protein, ur: NEGATIVE mg/dL
Specific Gravity, Urine: 1.025 (ref 1.005–1.030)
UROBILINOGEN UA: 1 mg/dL (ref 0.0–1.0)
pH: 6 (ref 5.0–8.0)

## 2015-07-02 LAB — SURGICAL PCR SCREEN
MRSA, PCR: NEGATIVE
Staphylococcus aureus: POSITIVE — AB

## 2015-07-02 LAB — GLUCOSE, CAPILLARY: Glucose-Capillary: 106 mg/dL — ABNORMAL HIGH (ref 65–99)

## 2015-07-02 LAB — CBC
HCT: 42 % (ref 39.0–52.0)
HEMOGLOBIN: 14.6 g/dL (ref 13.0–17.0)
MCH: 32.8 pg (ref 26.0–34.0)
MCHC: 34.8 g/dL (ref 30.0–36.0)
MCV: 94.4 fL (ref 78.0–100.0)
Platelets: 298 10*3/uL (ref 150–400)
RBC: 4.45 MIL/uL (ref 4.22–5.81)
RDW: 13.3 % (ref 11.5–15.5)
WBC: 8.6 10*3/uL (ref 4.0–10.5)

## 2015-07-02 LAB — PROTIME-INR
INR: 1.07 (ref 0.00–1.49)
Prothrombin Time: 14.1 seconds (ref 11.6–15.2)

## 2015-07-02 NOTE — Pre-Procedure Instructions (Addendum)
[name]  07/02/2015      [id number] - [place], [place] - [address] [place] [address] Phone: [phone] Fax: [phone]    Your procedure is scheduled on 07/13/15.  Report to [facility] short stay admitting at 530 A.M.  Call this number if you have problems the morning of surgery:  [phone]   Remember:  Do not eat food or drink liquids after midnight.  Take these medicines the morning of surgery with A SIP OF WATER amlodipine   STOP all herbel meds, nsaids (aleve,naproxen,advil,ibuprofen) 5 days prior to surgery starting 07/08/15 including vitamins, aspirin,    No metformin, actos, glipizide am of surgery   Do not wear jewelry, make-up or nail polish.  Do not wear lotions, powders, or perfumes.  You may wear deodorant.  Do not shave 48 hours prior to surgery.  Men may shave face and neck.  Do not bring valuables to the hospital.  [facility] is not responsible for any belongings or valuables.  Contacts, dentures or bridgework may not be worn into surgery.  Leave your suitcase in the car.  After surgery it may be brought to your room.  For patients admitted to the hospital, discharge time will be determined by your treatment team.  Patients discharged the day of surgery will not be allowed to drive home.   Name and phone number of your driver:    Special instructions:   Special Instructions:  - Preparing for Surgery  Before surgery, you can play an important role.  Because skin is not sterile, your skin needs to be as free of germs as possible.  You can reduce the number of germs on you skin by washing with CHG (chlorahexidine gluconate) soap before surgery.  CHG is an antiseptic cleaner which kills germs and bonds with the skin to continue killing germs even after washing.  Please DO NOT use if you have an allergy to CHG or antibacterial soaps.  If your skin becomes reddened/irritated stop  using the CHG and inform your nurse when you arrive at Short Stay.  Do not shave (including legs and underarms) for at least 48 hours prior to the first CHG shower.  You may shave your face.  Please follow these instructions carefully:   1.  Shower with CHG Soap the night before surgery and the morning of Surgery.  2.  If you choose to wash your hair, wash your hair first as usual with your normal shampoo.  3.  After you shampoo, rinse your hair and body thoroughly to remove the Shampoo.  4.  Use CHG as you would any other liquid soap.  You can apply chg directly  to the skin and wash gently with scrungie or a clean washcloth.  5.  Apply the CHG Soap to your body ONLY FROM THE NECK DOWN.  Do not use on open wounds or open sores.  Avoid contact with your eyes ears, mouth and genitals (private parts).  Wash genitals (private parts)       with your normal soap.  6.  Wash thoroughly, paying special attention to the area where your surgery will be performed.  7.  Thoroughly rinse your body with warm water from the neck down.  8.  DO NOT shower/wash with your normal soap after using and rinsing off the CHG Soap.  9.  Pat yourself dry with a clean towel.  10.  Wear clean pajamas.            11.  Place clean sheets on your bed the night of your first shower and do not sleep with pets.  Day of Surgery  Do not apply any lotions/deodorants the morning of surgery.  Please wear clean clothes to the hospital/surgery center.  Please read over the following fact sheets that you were given. Pain Booklet, Coughing and Deep Breathing, MRSA Information and Surgical Site Infection Prevention

## 2015-07-02 NOTE — Progress Notes (Signed)
   07/02/15 1521  OBSTRUCTIVE SLEEP APNEA  Have you ever been diagnosed with sleep apnea through a sleep study? No  Do you snore loudly (loud enough to be heard through closed doors)?  0  Do you often feel tired, fatigued, or sleepy during the daytime? 0  Has anyone observed you stop breathing during your sleep? 0  Do you have, or are you being treated for high blood pressure? 1  BMI more than 35 kg/m2? 1  Age over 55 years old? 1  Neck circumference greater than 40 cm/16 inches? 0 (17.5)  Gender: 1  Obstructive Sleep Apnea Score 4

## 2015-07-03 LAB — HEMOGLOBIN A1C
Hgb A1c MFr Bld: 6.4 % — ABNORMAL HIGH (ref 4.8–5.6)
MEAN PLASMA GLUCOSE: 137 mg/dL

## 2015-07-12 MED ORDER — DEXTROSE 5 % IV SOLN
3.0000 g | INTRAVENOUS | Status: AC
  Administered 2015-07-13: 3 g via INTRAVENOUS
  Filled 2015-07-12 (×2): qty 3000

## 2015-07-12 NOTE — H&P (Signed)
[name] is an 54 y.o. male.   A 54 year old male seen with continued left knee pain.  Patient states he would like to proceed with an injection.  He has had persistent problems with his knee with pain and aching, grinding, swelling on a daily basis.  His knee catches repetitively.  He had a previous ACL reconstruction done many years ago.  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 Juliette Mangle with Dr. Clydell Hakim assisting in 1994.  He is continuing to work for the last 22 years.  He had lumbar decompression surgery for congenital stenosis with an extremely large disk herniation instability, has had problems with persistent partial foot drop since that time, and continues to use a brace.  Patient had a myelographic block at the L3-4 level and mild stenosis at L4-5 with an extremely large HNP in addition to multifactorial stenosis.  Left knee continues to ache and hurt.  He has had anti-inflammatories, uses a cane intermittently, had intra-articular cortisone injections.   MEDICATIONS:  Includes ibuprofen 800 mg b.i.d., lisinopril 40 mg daily, Gliperide XL 1 p.o. daily, metformin 1000 mg 2 a day, Proglitazine 45 mg 1 a day, amlodipine 10 mg daily.   ALLERGIES:  Patient has no allergies.   PAST SURGICAL HISTORY:  Gallbladder surgery in 2010, lumbar decompression and fusion in 2009.   FAMILY HISTORY:  Positive for diabetes, history of cancer as well as hypertension.   SOCIAL HISTORY:  Patient is married to his wife, [name].  Works as a Curator.  Does not smoke, does not drink.   REVIEW OF SYSTEMS:  Positive for hypertension, back problems, diabetes, and past history of pneumonia.     Past Medical History  Diagnosis Date  . Hypertension   . Diabetes mellitus without complication     Past Surgical History  Procedure Laterality Date  . Knee arthroscopy Left 96  . Cholecystectomy  10  . Back surgery  09  . Eye surgery  Bilateral     strabismus   54 yrs old    No family history on file. Social History:  reports that he has never smoked. He does not have any smokeless tobacco history on file. He reports that he does not drink alcohol or use illicit drugs.  Allergies: No Known Allergies  No prescriptions prior to admission    No results found for this or any previous visit (from the past 48 hour(s)). No results found.  Review of Systems  Constitutional: Negative.   HENT: Negative.   Eyes: Negative.   Respiratory: Negative.   Cardiovascular: Negative.   Gastrointestinal: Negative.   Genitourinary: Negative.   Musculoskeletal: Positive for joint pain.  Psychiatric/Behavioral: Negative.     There were no vitals taken for this visit. Physical Exam  Constitutional: He is oriented to person, place, and time. He appears well-nourished.  HENT:  Head: Normocephalic.  Eyes: EOM are normal. Pupils are equal, round, and reactive to light.  Neck: Normal range of motion.  Respiratory: No respiratory distress.  GI: He exhibits no distension.  Musculoskeletal: He exhibits edema and tenderness.  Neurological: He is alert and oriented to person, place, and time.  Skin: Skin is warm and dry.  Psychiatric: He has a normal mood and affect.    PHYSICAL EXAMINATION:  Patient is alert and oriented.  Good visual acuity with glasses.  Increased BMI.  Lungs:  Clear.  Heart:  Regular rate and rhythm.  Liver and spleen  are not palpably enlarged.  Hip range of motion is normal.  Old ACL incision arthroscopic portals.  Bone-on-bone changes on plain radiographs.  Distal pulses are 2+.  Ankle range of motion is full actively.  He still has a partial foot drop.   RADIOGRAPHS:  X-rays demonstrate old [employer] staple in the tibia, interference screw in the femur, bone-on-bone changes, flattening of the condyle, subchondral sclerosis, marginal osteophytes.  He has 10 to 90 degrees range of motion.   ASSESSMENT:  Posttraumatic  osteoarthritis.   PLAN:  Total knee arthroplasty.  We discussed therapy, CPM, pain medications, spinal anesthesia normally used, but in his case with a previous spinal fusion we will proceed with general anesthesia.  Risks of surgery discussed including bleeding, infection, stiffness.  All questions answered.  Patient understands and requests we proceed.  We will need to remove the [employer] staple as well as the interference screw.  He would like to proceed with surgery at the end of July.  [name] M 07/12/2015, 5:48 PM

## 2015-07-13 ENCOUNTER — Inpatient Hospital Stay (HOSPITAL_COMMUNITY): Payer: 59 | Admitting: Anesthesiology

## 2015-07-13 ENCOUNTER — Inpatient Hospital Stay (HOSPITAL_COMMUNITY)
Admission: RE | Admit: 2015-07-13 | Discharge: 2015-07-16 | DRG: 470 | Disposition: A | Discharge status: Home Health | Payer: 59 | Source: Ambulatory Visit | Attending: Orthopaedic Surgery | Admitting: Orthopaedic Surgery

## 2015-07-13 ENCOUNTER — Encounter (HOSPITAL_COMMUNITY)
Admission: RE | Disposition: A | Discharge status: Home Health | Payer: Self-pay | Source: Ambulatory Visit | Attending: Orthopaedic Surgery

## 2015-07-13 ENCOUNTER — Encounter (HOSPITAL_COMMUNITY): Payer: Self-pay

## 2015-07-13 DIAGNOSIS — Z8249 Family history of ischemic heart disease and other diseases of the circulatory system: Secondary | ICD-10-CM | POA: Diagnosis not present

## 2015-07-13 DIAGNOSIS — M1732 Unilateral post-traumatic osteoarthritis, left knee: Principal | ICD-10-CM | POA: Diagnosis present

## 2015-07-13 DIAGNOSIS — E119 Type 2 diabetes mellitus without complications: Secondary | ICD-10-CM | POA: Diagnosis present

## 2015-07-13 DIAGNOSIS — Z833 Family history of diabetes mellitus: Secondary | ICD-10-CM | POA: Diagnosis not present

## 2015-07-13 DIAGNOSIS — Z96659 Presence of unspecified artificial knee joint: Secondary | ICD-10-CM

## 2015-07-13 DIAGNOSIS — M25562 Pain in left knee: Secondary | ICD-10-CM | POA: Diagnosis present

## 2015-07-13 DIAGNOSIS — I10 Essential (primary) hypertension: Secondary | ICD-10-CM | POA: Diagnosis present

## 2015-07-13 HISTORY — PX: KNEE ARTHROPLASTY: SHX992

## 2015-07-13 LAB — GLUCOSE, CAPILLARY
GLUCOSE-CAPILLARY: 177 mg/dL — AB (ref 65–99)
Glucose-Capillary: 187 mg/dL — ABNORMAL HIGH (ref 65–99)
Glucose-Capillary: 305 mg/dL — ABNORMAL HIGH (ref 65–99)
Glucose-Capillary: 171 mg/dL — ABNORMAL HIGH (ref 65–99)

## 2015-07-13 SURGERY — ARTHROPLASTY, KNEE, TOTAL, USING IMAGELESS COMPUTER-ASSISTED NAVIGATION
Anesthesia: Regional | Site: Knee | Laterality: Left

## 2015-07-13 MED ORDER — LACTATED RINGERS IV SOLN
INTRAVENOUS | Status: DC | PRN
  Administered 2015-07-13 (×3): via INTRAVENOUS

## 2015-07-13 MED ORDER — SUCCINYLCHOLINE CHLORIDE 20 MG/ML IJ SOLN
INTRAMUSCULAR | Status: DC | PRN
  Administered 2015-07-13: 120 mg via INTRAVENOUS

## 2015-07-13 MED ORDER — PRAVASTATIN SODIUM 20 MG PO TABS
10.0000 mg | ORAL_TABLET | Freq: Every day | ORAL | Status: DC
  Administered 2015-07-13 – 2015-07-16 (×4): 10 mg via ORAL
  Filled 2015-07-13 (×4): qty 1

## 2015-07-13 MED ORDER — ASPIRIN EC 325 MG PO TBEC: 325.0000 mg | DELAYED_RELEASE_TABLET | Freq: Every day | ORAL | Status: DC

## 2015-07-13 MED ORDER — ONDANSETRON HCL 4 MG/2ML IJ SOLN
INTRAMUSCULAR | Status: DC | PRN
  Administered 2015-07-13: 4 mg via INTRAVENOUS

## 2015-07-13 MED ORDER — LIDOCAINE HCL 4 % MT SOLN
OROMUCOSAL | Status: DC | PRN
  Administered 2015-07-13: 4 mL via TOPICAL

## 2015-07-13 MED ORDER — LIDOCAINE HCL (CARDIAC) 20 MG/ML IV SOLN
INTRAVENOUS | Status: DC | PRN
  Administered 2015-07-13: 80 mg via INTRAVENOUS

## 2015-07-13 MED ORDER — PROPOFOL 10 MG/ML IV BOLUS
INTRAVENOUS | Status: AC
  Filled 2015-07-13: qty 20

## 2015-07-13 MED ORDER — PHENYLEPHRINE 40 MCG/ML (10ML) SYRINGE FOR IV PUSH (FOR BLOOD PRESSURE SUPPORT)
PREFILLED_SYRINGE | INTRAVENOUS | Status: AC
  Filled 2015-07-13: qty 10

## 2015-07-13 MED ORDER — HYDROMORPHONE HCL 1 MG/ML IJ SOLN
0.5000 mg | INTRAMUSCULAR | Status: DC | PRN
  Administered 2015-07-13 (×2): 0.5 mg via INTRAVENOUS

## 2015-07-13 MED ORDER — LISINOPRIL 40 MG PO TABS
40.0000 mg | ORAL_TABLET | Freq: Every day | ORAL | Status: DC
  Administered 2015-07-13 – 2015-07-16 (×4): 40 mg via ORAL
  Filled 2015-07-13 (×4): qty 1

## 2015-07-13 MED ORDER — INSULIN ASPART 100 UNIT/ML ~~LOC~~ SOLN
SUBCUTANEOUS | Status: AC
  Filled 2015-07-13: qty 3

## 2015-07-13 MED ORDER — SODIUM CHLORIDE 0.9 % IR SOLN
Status: DC | PRN
  Administered 2015-07-13: 3000 mL

## 2015-07-13 MED ORDER — MIDAZOLAM HCL 5 MG/5ML IJ SOLN
INTRAMUSCULAR | Status: DC | PRN
  Administered 2015-07-13: 2 mg via INTRAVENOUS

## 2015-07-13 MED ORDER — METHOCARBAMOL 500 MG PO TABS: 500.0000 mg | ORAL_TABLET | Freq: Four times a day (QID) | ORAL | Status: DC | PRN

## 2015-07-13 MED ORDER — DOCUSATE SODIUM 100 MG PO CAPS
100.0000 mg | ORAL_CAPSULE | Freq: Two times a day (BID) | ORAL | Status: DC
  Administered 2015-07-13 – 2015-07-16 (×7): 100 mg via ORAL
  Filled 2015-07-13 (×7): qty 1

## 2015-07-13 MED ORDER — ROCURONIUM BROMIDE 50 MG/5ML IV SOLN
INTRAVENOUS | Status: AC
  Filled 2015-07-13: qty 1

## 2015-07-13 MED ORDER — METHOCARBAMOL 500 MG PO TABS
500.0000 mg | ORAL_TABLET | Freq: Four times a day (QID) | ORAL | Status: DC | PRN
Start: 2015-07-13 — End: 2015-07-16
  Administered 2015-07-13 – 2015-07-15 (×6): 500 mg via ORAL
  Filled 2015-07-13 (×7): qty 1

## 2015-07-13 MED ORDER — AMLODIPINE BESYLATE 10 MG PO TABS
10.0000 mg | ORAL_TABLET | Freq: Every day | ORAL | Status: DC
  Administered 2015-07-14 – 2015-07-16 (×3): 10 mg via ORAL
  Filled 2015-07-13 (×3): qty 1

## 2015-07-13 MED ORDER — ONDANSETRON HCL 4 MG/2ML IJ SOLN: 4.0000 mg | Freq: Once | INTRAMUSCULAR | Status: DC | PRN

## 2015-07-13 MED ORDER — ACETAMINOPHEN 650 MG RE SUPP: 650.0000 mg | Freq: Four times a day (QID) | RECTAL | Status: DC | PRN

## 2015-07-13 MED ORDER — SODIUM CHLORIDE 0.9 % IJ SOLN
INTRAMUSCULAR | Status: AC
  Filled 2015-07-13: qty 10

## 2015-07-13 MED ORDER — METFORMIN HCL 500 MG PO TABS
1000.0000 mg | ORAL_TABLET | Freq: Two times a day (BID) | ORAL | Status: DC
  Administered 2015-07-13 – 2015-07-16 (×6): 1000 mg via ORAL
  Filled 2015-07-13 (×6): qty 2

## 2015-07-13 MED ORDER — MIDAZOLAM HCL 2 MG/2ML IJ SOLN
INTRAMUSCULAR | Status: AC
  Filled 2015-07-13: qty 2

## 2015-07-13 MED ORDER — GLYCOPYRROLATE 0.2 MG/ML IJ SOLN
INTRAMUSCULAR | Status: DC | PRN
  Administered 2015-07-13: 0.2 mg via INTRAVENOUS

## 2015-07-13 MED ORDER — HYDROMORPHONE HCL 1 MG/ML IJ SOLN
1.0000 mg | INTRAMUSCULAR | Status: DC | PRN
  Administered 2015-07-14 (×2): 1 mg via INTRAVENOUS
  Filled 2015-07-13 (×2): qty 1

## 2015-07-13 MED ORDER — ACETAMINOPHEN 325 MG PO TABS
650.0000 mg | ORAL_TABLET | Freq: Four times a day (QID) | ORAL | Status: DC | PRN
  Administered 2015-07-14 – 2015-07-15 (×2): 650 mg via ORAL
  Filled 2015-07-13 (×2): qty 2

## 2015-07-13 MED ORDER — ONDANSETRON HCL 4 MG PO TABS: 4.0000 mg | ORAL_TABLET | Freq: Four times a day (QID) | ORAL | Status: DC | PRN

## 2015-07-13 MED ORDER — TRANEXAMIC ACID 1000 MG/10ML IV SOLN
1000.0000 mg | INTRAVENOUS | Status: AC
  Administered 2015-07-13: 1000 mg via INTRAVENOUS
  Filled 2015-07-13: qty 10

## 2015-07-13 MED ORDER — GLIPIZIDE ER 10 MG PO TB24
10.0000 mg | ORAL_TABLET | Freq: Two times a day (BID) | ORAL | Status: DC
  Administered 2015-07-13 – 2015-07-16 (×6): 10 mg via ORAL
  Filled 2015-07-13 (×9): qty 1

## 2015-07-13 MED ORDER — BISACODYL 10 MG RE SUPP: 10.0000 mg | Freq: Every day | RECTAL | Status: DC | PRN

## 2015-07-13 MED ORDER — ASPIRIN EC 325 MG PO TBEC
325.0000 mg | DELAYED_RELEASE_TABLET | Freq: Every day | ORAL | Status: DC
  Administered 2015-07-14 – 2015-07-16 (×3): 325 mg via ORAL
  Filled 2015-07-13 (×3): qty 1

## 2015-07-13 MED ORDER — METOCLOPRAMIDE HCL 5 MG PO TABS
5.0000 mg | ORAL_TABLET | Freq: Three times a day (TID) | ORAL | Status: DC | PRN
Start: 2015-07-13 — End: 2015-07-16

## 2015-07-13 MED ORDER — PHENOL 1.4 % MT LIQD: 1.0000 | OROMUCOSAL | Status: DC | PRN

## 2015-07-13 MED ORDER — OXYCODONE HCL 5 MG PO TABS
5.0000 mg | ORAL_TABLET | ORAL | Status: DC | PRN
  Administered 2015-07-13 – 2015-07-14 (×5): 10 mg via ORAL
  Administered 2015-07-15 – 2015-07-16 (×7): 5 mg via ORAL
  Filled 2015-07-13: qty 1
  Filled 2015-07-13 (×2): qty 2
  Filled 2015-07-13 (×4): qty 1
  Filled 2015-07-13: qty 2
  Filled 2015-07-13 (×2): qty 1
  Filled 2015-07-13 (×2): qty 2
  Filled 2015-07-13: qty 1

## 2015-07-13 MED ORDER — CHLORHEXIDINE GLUCONATE 4 % EX LIQD: 60.0000 mL | Freq: Once | CUTANEOUS | Status: DC

## 2015-07-13 MED ORDER — INSULIN ASPART 100 UNIT/ML ~~LOC~~ SOLN
0.0000 [IU] | Freq: Three times a day (TID) | SUBCUTANEOUS | Status: DC
  Administered 2015-07-13: 3 [IU] via SUBCUTANEOUS
  Administered 2015-07-13: 11 [IU] via SUBCUTANEOUS
  Administered 2015-07-14: 5 [IU] via SUBCUTANEOUS
  Administered 2015-07-14: 3 [IU] via SUBCUTANEOUS
  Administered 2015-07-14: 5 [IU] via SUBCUTANEOUS
  Administered 2015-07-15: 3 [IU] via SUBCUTANEOUS
  Administered 2015-07-15: 2 [IU] via SUBCUTANEOUS
  Administered 2015-07-15: 5 [IU] via SUBCUTANEOUS
  Administered 2015-07-16: 3 [IU] via SUBCUTANEOUS
  Administered 2015-07-16: 5 [IU] via SUBCUTANEOUS

## 2015-07-13 MED ORDER — MENTHOL 3 MG MT LOZG: 1.0000 | LOZENGE | OROMUCOSAL | Status: DC | PRN

## 2015-07-13 MED ORDER — FENTANYL CITRATE (PF) 250 MCG/5ML IJ SOLN
INTRAMUSCULAR | Status: AC
  Filled 2015-07-13: qty 5

## 2015-07-13 MED ORDER — METOCLOPRAMIDE HCL 5 MG/ML IJ SOLN: 5.0000 mg | Freq: Three times a day (TID) | INTRAMUSCULAR | Status: DC | PRN

## 2015-07-13 MED ORDER — HYDROMORPHONE HCL 1 MG/ML IJ SOLN
INTRAMUSCULAR | Status: AC
  Filled 2015-07-13: qty 1

## 2015-07-13 MED ORDER — OXYCODONE-ACETAMINOPHEN 7.5-325 MG PO TABS: 1.0000 | ORAL_TABLET | Freq: Four times a day (QID) | ORAL | Status: DC | PRN

## 2015-07-13 MED ORDER — SUCCINYLCHOLINE CHLORIDE 20 MG/ML IJ SOLN
INTRAMUSCULAR | Status: AC
  Filled 2015-07-13: qty 1

## 2015-07-13 MED ORDER — PHENYLEPHRINE HCL 10 MG/ML IJ SOLN
10.0000 mg | INTRAVENOUS | Status: DC | PRN
  Administered 2015-07-13: 25 ug/min via INTRAVENOUS

## 2015-07-13 MED ORDER — METHOCARBAMOL 1000 MG/10ML IJ SOLN
500.0000 mg | Freq: Four times a day (QID) | INTRAMUSCULAR | Status: DC | PRN
  Administered 2015-07-13: 500 mg via INTRAVENOUS
  Filled 2015-07-13 (×2): qty 5

## 2015-07-13 MED ORDER — POTASSIUM CHLORIDE IN NACL 20-0.45 MEQ/L-% IV SOLN
INTRAVENOUS | Status: DC
Start: 2015-07-13 — End: 2015-07-16
  Administered 2015-07-13 – 2015-07-14 (×2): via INTRAVENOUS
  Filled 2015-07-13 (×9): qty 1000

## 2015-07-13 MED ORDER — PHENYLEPHRINE HCL 10 MG/ML IJ SOLN
INTRAMUSCULAR | Status: DC | PRN
  Administered 2015-07-13 (×3): 80 ug via INTRAVENOUS

## 2015-07-13 MED ORDER — ONDANSETRON HCL 4 MG/2ML IJ SOLN: 4.0000 mg | Freq: Four times a day (QID) | INTRAMUSCULAR | Status: DC | PRN

## 2015-07-13 MED ORDER — ONDANSETRON HCL 4 MG/2ML IJ SOLN
INTRAMUSCULAR | Status: AC
  Filled 2015-07-13: qty 2

## 2015-07-13 MED ORDER — PIOGLITAZONE HCL 45 MG PO TABS
45.0000 mg | ORAL_TABLET | Freq: Every day | ORAL | Status: DC
  Administered 2015-07-14 – 2015-07-16 (×3): 45 mg via ORAL
  Filled 2015-07-13 (×4): qty 1

## 2015-07-13 MED ORDER — PROPOFOL 10 MG/ML IV BOLUS
INTRAVENOUS | Status: DC | PRN
  Administered 2015-07-13: 300 mg via INTRAVENOUS

## 2015-07-13 MED ORDER — FENTANYL CITRATE (PF) 100 MCG/2ML IJ SOLN
INTRAMUSCULAR | Status: DC | PRN
  Administered 2015-07-13 (×3): 50 ug via INTRAVENOUS
  Administered 2015-07-13: 100 ug via INTRAVENOUS
  Administered 2015-07-13: 150 ug via INTRAVENOUS
  Administered 2015-07-13: 50 ug via INTRAVENOUS

## 2015-07-13 MED ORDER — EPHEDRINE SULFATE 50 MG/ML IJ SOLN
INTRAMUSCULAR | Status: AC
  Filled 2015-07-13: qty 1

## 2015-07-13 MED ORDER — LIDOCAINE HCL (CARDIAC) 20 MG/ML IV SOLN
INTRAVENOUS | Status: AC
  Filled 2015-07-13: qty 5

## 2015-07-13 SURGICAL SUPPLY — 61 items
BANDAGE ELASTIC 4 VELCRO ST LF (GAUZE/BANDAGES/DRESSINGS) ×2 IMPLANT
BANDAGE ELASTIC 6 VELCRO ST LF (GAUZE/BANDAGES/DRESSINGS) ×2 IMPLANT
BANDAGE ESMARK 6X9 LF (GAUZE/BANDAGES/DRESSINGS) ×1 IMPLANT
BENZOIN TINCTURE PRP APPL 2/3 (GAUZE/BANDAGES/DRESSINGS) ×2 IMPLANT
BLADE SAGITTAL 25.0X1.19X90 (BLADE) ×2 IMPLANT
BLADE SAW SGTL 13X75X1.27 (BLADE) ×2 IMPLANT
BNDG ELASTIC 6X10 VLCR STRL LF (GAUZE/BANDAGES/DRESSINGS) ×2 IMPLANT
BNDG ESMARK 6X9 LF (GAUZE/BANDAGES/DRESSINGS) ×2
BOWL SMART MIX CTS (DISPOSABLE) ×2 IMPLANT
CAP KNEE TOTAL 3 SIGMA ×2 IMPLANT
CEMENT HV SMART SET (Cement) ×4 IMPLANT
CLSR STERI-STRIP ANTIMIC 1/2X4 (GAUZE/BANDAGES/DRESSINGS) ×2 IMPLANT
COVER SURGICAL LIGHT HANDLE (MISCELLANEOUS) ×2 IMPLANT
CUFF TOURNIQUET SINGLE 34IN LL (TOURNIQUET CUFF) ×2 IMPLANT
CUFF TOURNIQUET SINGLE 44IN (TOURNIQUET CUFF) IMPLANT
DRAPE ORTHO SPLIT 77X108 STRL (DRAPES) ×2
DRAPE SURG ORHT 6 SPLT 77X108 (DRAPES) ×2 IMPLANT
DRAPE U-SHAPE 47X51 STRL (DRAPES) ×2 IMPLANT
DRSG PAD ABDOMINAL 8X10 ST (GAUZE/BANDAGES/DRESSINGS) ×2 IMPLANT
DURAPREP 26ML APPLICATOR (WOUND CARE) ×2 IMPLANT
ELECT REM PT RETURN 9FT ADLT (ELECTROSURGICAL) ×2
ELECTRODE REM PT RTRN 9FT ADLT (ELECTROSURGICAL) ×1 IMPLANT
FACESHIELD WRAPAROUND (MASK) ×4 IMPLANT
GAUZE SPONGE 4X4 12PLY STRL (GAUZE/BANDAGES/DRESSINGS) ×4 IMPLANT
GAUZE XEROFORM 5X9 LF (GAUZE/BANDAGES/DRESSINGS) ×2 IMPLANT
GLOVE BIOGEL PI IND STRL 8 (GLOVE) ×2 IMPLANT
GLOVE BIOGEL PI INDICATOR 8 (GLOVE) ×2
GLOVE ORTHO TXT STRL SZ7.5 (GLOVE) ×4 IMPLANT
GOWN STRL REUS W/ TWL LRG LVL3 (GOWN DISPOSABLE) ×1 IMPLANT
GOWN STRL REUS W/ TWL XL LVL3 (GOWN DISPOSABLE) ×1 IMPLANT
GOWN STRL REUS W/TWL 2XL LVL3 (GOWN DISPOSABLE) ×2 IMPLANT
GOWN STRL REUS W/TWL LRG LVL3 (GOWN DISPOSABLE) ×1
GOWN STRL REUS W/TWL XL LVL3 (GOWN DISPOSABLE) ×1
HANDPIECE INTERPULSE COAX TIP (DISPOSABLE) ×1
IMMOBILIZER KNEE 22 UNIV (SOFTGOODS) ×2 IMPLANT
KIT BASIN OR (CUSTOM PROCEDURE TRAY) ×2 IMPLANT
KIT ROOM TURNOVER OR (KITS) ×2 IMPLANT
MANIFOLD NEPTUNE II (INSTRUMENTS) ×2 IMPLANT
MARKER SPHERE PSV REFLC THRD 5 (MARKER) ×6 IMPLANT
NEEDLE HYPO 25GX1X1/2 BEV (NEEDLE) ×2 IMPLANT
NS IRRIG 1000ML POUR BTL (IV SOLUTION) ×2 IMPLANT
PACK TOTAL JOINT (CUSTOM PROCEDURE TRAY) ×2 IMPLANT
PACK UNIVERSAL I (CUSTOM PROCEDURE TRAY) ×2 IMPLANT
PAD ARMBOARD 7.5X6 YLW CONV (MISCELLANEOUS) ×4 IMPLANT
PAD CAST 4YDX4 CTTN HI CHSV (CAST SUPPLIES) ×1 IMPLANT
PADDING CAST COTTON 4X4 STRL (CAST SUPPLIES) ×1
PADDING CAST COTTON 6X4 STRL (CAST SUPPLIES) ×2 IMPLANT
PIN SCHANZ 4MM 130MM (PIN) ×8 IMPLANT
SET HNDPC FAN SPRY TIP SCT (DISPOSABLE) ×1 IMPLANT
SPONGE GAUZE 4X4 12PLY STER LF (GAUZE/BANDAGES/DRESSINGS) ×2 IMPLANT
STAPLER VISISTAT 35W (STAPLE) IMPLANT
SUCTION FRAZIER TIP 10 FR DISP (SUCTIONS) ×2 IMPLANT
SUT VIC AB 1 CTX 36 (SUTURE) ×2
SUT VIC AB 1 CTX36XBRD ANBCTR (SUTURE) ×2 IMPLANT
SUT VIC AB 2-0 CT1 27 (SUTURE) ×2
SUT VIC AB 2-0 CT1 TAPERPNT 27 (SUTURE) ×2 IMPLANT
SUT VIC AB 3-0 X1 27 (SUTURE) ×4 IMPLANT
SYR CONTROL 10ML LL (SYRINGE) ×2 IMPLANT
TOWEL OR 17X24 6PK STRL BLUE (TOWEL DISPOSABLE) ×2 IMPLANT
TOWEL OR 17X26 10 PK STRL BLUE (TOWEL DISPOSABLE) ×2 IMPLANT
WATER STERILE IRR 1000ML POUR (IV SOLUTION) ×2 IMPLANT

## 2015-07-13 NOTE — Progress Notes (Signed)
Utilization review completed.  

## 2015-07-13 NOTE — Evaluation (Signed)
Physical Therapy Evaluation Patient Details Name: [name] MRN: [id number] DOB: 09-Nov-1961 Today's Date: 07/13/2015   History of Present Illness  Pt is a 54 y/o M s/p L TKA. Pt's PMH includes HTN, DM, back surgery.  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  Clinical Impression  Pt is s/p L TKA resulting in the deficits listed below (see PT Problem List).  Pt has R orthotic that should be worn when ambulating.  Ambulated 15 ft in room this session.  Mr. Orlich will have 24/7 assist at home upon d/c. Pt will benefit from skilled PT to increase their independence and safety with mobility to allow discharge to the venue listed below.     Follow Up Recommendations Home health PT;Supervision for mobility/OOB    Equipment Recommendations  Rolling walker with 5" wheels;3in1 (PT)    Recommendations for Other Services OT consult     Precautions / Restrictions Precautions Precautions: Knee;Fall Precaution Booklet Issued: Yes (comment) Precaution Comments: Reviewed no pillow under knee Required Braces or Orthoses: Knee Immobilizer - Left;Other Brace/Splint Knee Immobilizer - Left: Discontinue once straight leg raise with < 10 degree lag Other Brace/Splint: R ankle orthotic which pt wears as a result of drop foot 2/2 previous back complications, he denies any other weakness or N/T in the RLE Restrictions Weight Bearing Restrictions: Yes LLE Weight Bearing: Weight bearing as tolerated      Mobility  Bed Mobility Overal bed mobility: Modified Independent             General bed mobility comments: Mod I w/ use of bed rails, increased time, cues for technique, and HOB slightly elevated.  Transfers Overall transfer level: Needs assistance Equipment used: Rolling walker (2 wheeled) Transfers: Sit to/from Stand Sit to Stand: Min assist;From elevated surface         General transfer comment: Min assist to power up to standing  and to steady RW on floor.  Cues for hand placement and technique.    Ambulation/Gait Ambulation/Gait assistance: Min guard Ambulation Distance (Feet): 15 Feet Assistive device: Rolling walker (2 wheeled) Gait Pattern/deviations: Step-to pattern;Antalgic;Shuffle;Decreased dorsiflexion - right;Trunk flexed;Decreased stride length;Decreased stance time - left;Decreased weight shift to left   Gait velocity interpretation: Below normal speed for age/gender General Gait Details: Shuffle B (R>L).  Pt reports his R foot shuffles sometimes 2/2 foot drop.  Orthotic and shoes applied prior to ambulation. Trunk flexed and inc WB through BUEs.    Stairs            Wheelchair Mobility    Modified Rankin (Stroke Patients Only)       Balance Overall balance assessment: Needs assistance Sitting-balance support: Bilateral upper extremity supported;Feet supported Sitting balance-Leahy Scale: Fair     Standing balance support: Bilateral upper extremity supported;During functional activity Standing balance-Leahy Scale: Poor Standing balance comment: Relies on RW for support                             Pertinent Vitals/Pain Pain Assessment: 0-10 Pain Score: 5  Pain Location: L Knee (Pt reports high tolerance for pain) Pain Descriptors / Indicators: Aching Pain Intervention(s): Limited activity within patient's tolerance;Monitored during session;Repositioned    Home Living Family/patient expects to be discharged to:: Private residence Living Arrangements: Spouse/significant other;Children (wife, grandchildren) Available Help at Discharge: Family;Available 24 hours/day (Wife, daughter, and grandchildren) Type of Home: House Home Access: Stairs to enter  Entrance Stairs-Rails: Can reach both Entrance Stairs-Number of Steps: 4 Home Layout: One level Home Equipment: Walker - standard      Prior Function Level of Independence: Independent               Hand Dominance         Extremity/Trunk Assessment   Upper Extremity Assessment: Defer to OT evaluation           Lower Extremity Assessment: LLE deficits/detail;RLE deficits/detail RLE Deficits / Details: R ankle DF 4/5 w/ MMT; however pt reports foot drop.  Inversion noted on R foot. LLE Deficits / Details: weakness and limited ROM s/p L TKA     Communication   Communication: No difficulties  Cognition Arousal/Alertness: Awake/alert Behavior During Therapy: WFL for tasks assessed/performed Overall Cognitive Status: Within Functional Limits for tasks assessed                      General Comments      Exercises Total Joint Exercises Ankle Circles/Pumps: AROM;Both;15 reps;Supine Quad Sets: AROM;Both;10 reps;Supine Heel Slides: AROM;Left;5 reps;Supine      Assessment/Plan    PT Assessment Patient needs continued PT services  PT Diagnosis Difficulty walking;Abnormality of gait;Generalized weakness;Acute pain   PT Problem List Decreased strength;Decreased range of motion;Decreased activity tolerance;Decreased balance;Decreased mobility;Decreased coordination;Decreased knowledge of use of DME;Decreased safety awareness;Decreased knowledge of precautions;Obesity;Decreased skin integrity;Pain  PT Treatment Interventions DME instruction;Gait training;Stair training;Functional mobility training;Therapeutic activities;Therapeutic exercise;Balance training;Neuromuscular re-education;Patient/family education;Modalities   PT Goals (Current goals can be found in the Care Plan section) Acute Rehab PT Goals Patient Stated Goal: to get stronger so he can go home and enjoy his pool  PT Goal Formulation: With patient Time For Goal Achievement: 07/20/15 Potential to Achieve Goals: Good    Frequency 7X/week   Barriers to discharge Inaccessible home environment 4 steps to enter home    Co-evaluation               End of Session Equipment Utilized During Treatment: Gait belt;Left knee  immobilizer;Other (comment) (R orthotic) Activity Tolerance: Patient limited by fatigue Patient left: in chair;with call bell/phone within reach;with family/visitor present Nurse Communication: Mobility status;Precautions;Weight bearing status;Other (comment) (need to have shoes and orthotic on when ambulating)         Time: 4628-6381 PT Time Calculation (min) (ACUTE ONLY): 38 min   Charges:   PT Evaluation $Initial PT Evaluation Tier I: 1 Procedure PT Treatments $Gait Training: 8-22 mins $Therapeutic Exercise: 8-22 mins   PT G Codes:       Joslyn Hy PT, DPT [phone] Pager: [phone] 07/13/2015, 3:47 PM

## 2015-07-13 NOTE — Discharge Instructions (Signed)
INSTRUCTIONS AFTER JOINT REPLACEMENT   o Remove items at home which could result in a fall. This includes throw rugs or furniture in walking pathways o ICE to the affected joint every three hours while awake for 30 minutes at a time, for at least the first 3-5 days, and then as needed for pain and swelling.  Continue to use ice for pain and swelling. You may notice swelling that will progress down to the foot and ankle.  This is normal after surgery.  Elevate your leg when you are not up walking on it.   o Continue to use the breathing machine you got in the hospital (incentive spirometer) which will help keep your temperature down.  It is common for your temperature to cycle up and down following surgery, especially at night when you are not up moving around and exerting yourself.  The breathing machine keeps your lungs expanded and your temperature down.   DIET:  As you were doing prior to hospitalization, we recommend a well-balanced diet.  DRESSING / WOUND CARE / SHOWERING  You may change your dressing 3-5 days after surgery.  Then change the dressing every day with sterile gauze.  Please use good hand washing techniques before changing the dressing.  Do not use any lotions or creams on the incision until instructed by your surgeon. and You may shower 3 days after surgery, but keep the wounds dry during showering.  You may use an occlusive plastic wrap (Press'n Seal for example), NO SOAKING/SUBMERGING IN THE BATHTUB.  If the bandage gets wet, change with a clean dry gauze.  If the incision gets wet, pat the wound dry with a clean towel.  ACTIVITY  o Increase activity slowly as tolerated, but follow the weight bearing instructions below.   o No driving for 6 weeks or until further direction given by your physician.  You cannot drive while taking narcotics.  o No lifting or carrying greater than 10 lbs. until further directed by your surgeon. o Avoid periods of inactivity such as sitting longer  than an hour when not asleep. This helps prevent blood clots.  o You may return to work once you are authorized by your doctor.   DRIVING NO driving until at least 6 weeks postop.  Physician will let you know around that time.   WEIGHT BEARING   Weight bearing as tolerated with assist device (walker, cane, etc) as directed, use it as long as suggested by your surgeon or therapist, typically at least 4-6 weeks.   EXERCISES  Results after joint replacement surgery are often greatly improved when you follow the exercise, range of motion and muscle strengthening exercises prescribed by your doctor. Safety measures are also important to protect the joint from further injury. Any time any of these exercises cause you to have increased pain or swelling, decrease what you are doing until you are comfortable again and then slowly increase them. If you have problems or questions, call your caregiver or physical therapist for advice.   Rehabilitation is important following a joint replacement. After just a few days of immobilization, the muscles of the leg can become weakened and shrink (atrophy).  These exercises are designed to build up the tone and strength of the thigh and leg muscles and to improve motion. Often times heat used for twenty to thirty minutes before working out will loosen up your tissues and help with improving the range of motion but do not use heat for the first two weeks  following surgery (sometimes heat can increase post-operative swelling).   These exercises can be done on a training (exercise) mat, on the floor, on a table or on a bed. Use whatever works the best and is most comfortable for you.    Use music or television while you are exercising so that the exercises are a pleasant break in your day. This will make your life better with the exercises acting as a break in your routine that you can look forward to.   Perform all exercises about fifteen times, three times per day or as  directed.  You should exercise both the operative leg and the other leg as well.  Exercises include:    Quad Sets - Tighten up the muscle on the front of the thigh (Quad) and hold for 5-10 seconds.    Straight Leg Raises - With your knee straight (if you were given a brace, keep it on), lift the leg to 60 degrees, hold for 3 seconds, and slowly lower the leg.  Perform this exercise against resistance later as your leg gets stronger.   Leg Slides: Lying on your back, slowly slide your foot toward your buttocks, bending your knee up off the floor (only go as far as is comfortable). Then slowly slide your foot back down until your leg is flat on the floor again.   Angel Wings: Lying on your back spread your legs to the side as far apart as you can without causing discomfort.   Hamstring Strength:  Lying on your back, push your heel against the floor with your leg straight by tightening up the muscles of your buttocks.  Repeat, but this time bend your knee to a comfortable angle, and push your heel against the floor.  You may put a pillow under the heel to make it more comfortable if necessary.   A rehabilitation program following joint replacement surgery can speed recovery and prevent re-injury in the future due to weakened muscles. Contact your doctor or a physical therapist for more information on knee rehabilitation.    CONSTIPATION  Constipation is defined medically as fewer than three stools per week and severe constipation as less than one stool per week.  Even if you have a regular bowel pattern at home, your normal regimen is likely to be disrupted due to multiple reasons following surgery.  Combination of anesthesia, postoperative narcotics, change in appetite and fluid intake all can affect your bowels.   YOU MUST use at least one of the following options; they are listed in order of increasing strength to get the job done.  They are all available over the counter, and you may need to  use some, POSSIBLY even all of these options:    Drink plenty of fluids (prune juice may be helpful) and high fiber foods Colace 100 mg by mouth twice a day  Senokot for constipation as directed and as needed Dulcolax (bisacodyl), take with full glass of water  Miralax (polyethylene glycol) once or twice a day as needed.  If you have tried all these things and are unable to have a bowel movement in the first 3-4 days after surgery call either your surgeon or your primary doctor.    If you experience loose stools or diarrhea, hold the medications until you stool forms back up.  If your symptoms do not get better within 1 week or if they get worse, check with your doctor.  If you experience "the worst abdominal pain ever" or develop  nausea or vomiting, please contact the office immediately for further recommendations for treatment.   ITCHING:  If you experience itching with your medications, try taking only a single pain pill, or even half a pain pill at a time.  You can also use Benadryl over the counter for itching or also to help with sleep.   TED HOSE STOCKINGS:  Use stockings on both legs until for at least 2 weeks or as directed by physician office. They may be removed at night for sleeping.  MEDICATIONS:  See your medication summary on the After Visit Summary that nursing will review with you.  You may have some home medications which will be placed on hold until you complete the course of blood thinner medication.  It is important for you to complete the blood thinner medication as prescribed.  PRECAUTIONS:  If you experience chest pain or shortness of breath - call 911 immediately for transfer to the hospital emergency department.   If you develop a fever greater that 101 F, purulent drainage from wound, increased redness or drainage from wound, foul odor from the wound/dressing, or calf pain - CONTACT YOUR SURGEON.                                                   FOLLOW-UP  APPOINTMENTS:  If you do not already have a post-op appointment, please call the office for an appointment to be seen by your surgeon.  Guidelines for how soon to be seen are listed in your After Visit Summary, but are typically between 1-4 weeks after surgery.  OTHER INSTRUCTIONS:   Knee Replacement:  Do not place pillow under knee, focus on keeping the knee straight while resting. CPM instructions: 0-90 degrees, 2 hours in the morning, 2 hours in the afternoon, and 2 hours in the evening. Place foam block, curve side up under heel at all times except when in CPM or when walking.  DO NOT modify, tear, cut, or change the foam block in any way.  MAKE SURE YOU:   Understand these instructions.   Get help right away if you are not doing well or get worse.    Thank you for letting us be a part of your medical care team.  It is a privilege we respect greatly.  We hope these instructions will help you stay on track for a fast and full recovery!

## 2015-07-13 NOTE — Op Note (Signed)
[name], [name] NO.:  [id number]  MEDICAL RECORD NO.:  [id number]  LOCATION:  [id number]                        FACILITY:  [name]  PHYSICIAN:  Ruven Corradi C. Lorin Mercy, M.D.    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  POSTOPERATIVE DIAGNOSES:  Previous ACL reconstruction with post- traumatic left knee osteoarthritis.  PROCEDURE:  Removal of tibial staple and femoral interference screw. Cemented total knee arthroplasty.  LCS [employer] #5 femur with lug cemented, #4 tibia rotating platform, 12.5-mm spacer, 41 mm 3-peg patella.  SURGEON:  Rosalee Tolley C. Lorin Mercy, M.D.  ASSISTANT:  Alyson Locket. Ricard Dillon, PA-C, medically necessary and present for entire procedure.  ANESTHESIA:  Preoperative adductor block plus general anesthesia.  TOURNIQUET TIME:  An hour and 40 minutes.  DRAINS:  None.  COMPLICATIONS:  None.  INDICATIONS:  This patient had previous ACL reconstruction by Dr. Larose Kells and Dr. Maryjean Ka many years ago.  Persistent problems with progressive osteoarthritis posttraumatic.  He has varus deformity, 8- degree knee flexion deformity, and 7 degrees of varus.  PROCEDURE IN DETAIL:  After standard prepping and draping, preoperative Ancef prophylaxis, impervious stockinette, sterile skin marker, incorporating the old ACL incision which was almost at the midline over the patellar tendon, slightly to the medial side, the skin line was extended proximally and distally.  Betadine Steri-Drape was applied. Leg was wrapped with an Esmarch, tourniquet elevated to 350.  Midline incision was made.  Medial parapatellar incision was made.  Patella was everted, cut was made removing 10 mm of bone off sizing drilling for pegs for 41 mm patella.  The pins were placed for computer  navigation. Tibial staple was identified and needed to be removed for placement of the total knee arthroplasty and with some difficulty using the small osteotomes and Joker was eventually pried out.  The screw on the femoral side had overgrowth and was not identified until the box cut was made on the femur.  Models were made for the femur and the tibia.  Posterior condyles were used for rotation on the femur.  The piano sign anteriorly looked good.  Cut was flushed with the anterior cortex of the distal femur.  #5 was appropriate for the femur and 10 mm removed for both tibia and femur.  Tibia cut had significant scalping posterior medial into the medial tibial plateau with erosive wear.  More tibia had to be cut initially.  Some of the spurs along the medial aspect cracked a little bit of the cortex along the medial aspect.  There was still cortical bone for placement of the tray.  PCL was completely resected. Trials were inserted, 12.5 was appropriate.  Pulsatile lavage was used, vacuum cement mixing.  Initially, an additional 2 mm had to be taken off the tibia since it was tight in both flexion and extension.  Computer suggested appropriate cut, however, looked like fair amount of tibia was being removed due to the patient's flexion contracture and anterior spurring on the anterior aspect of the tibia.  Once the re-cut taking 2 more mm, there was good fit.  Knee came in full extension.  Medial side had been slightly released catching the deep fibers of the MCL. Pulsatile lavage, vacuum mixing, cementing of the tibia, followed by femur, placement of the 12.5 tray and then cementing the patella. Cement was hardened at 15 minutes.  Tourniquet was deflated.  Hemostasis was obtained and then standard layered closure, subcuticular closure in the skin.  Steri-Strips, postop dressing, and knee immobilizer. Instrument count and needle count was correct.     Charletha Dalpe C. Lorin Mercy,  M.D.     MCY/MEDQ  D:  07/13/2015  T:  07/13/2015  Job:  [id number]

## 2015-07-13 NOTE — Anesthesia Preprocedure Evaluation (Addendum)
Anesthesia Evaluation  Patient identified by MRN, date of birth, ID band Patient awake    Reviewed: Allergy & Precautions, NPO status , Patient's Chart, lab work & pertinent test results  Airway Mallampati: I       Dental   Pulmonary    + decreased breath sounds      Cardiovascular hypertension, Normal cardiovascular exam    Neuro/Psych    GI/Hepatic   Endo/Other  diabetes, Type 2, Oral Hypoglycemic Agents  Renal/GU      Musculoskeletal   Abdominal   Peds  Hematology   Anesthesia Other Findings   Reproductive/Obstetrics                            Anesthesia Physical Anesthesia Plan  ASA: III  Anesthesia Plan: General   Post-op Pain Management: GA combined w/ Regional for post-op pain   Induction: Intravenous  Airway Management Planned: Oral ETT  Additional Equipment:   Intra-op Plan:   Post-operative Plan: Extubation in OR  Informed Consent: I have reviewed the patients History and Physical, chart, labs and discussed the procedure including the risks, benefits and alternatives for the proposed anesthesia with the patient or authorized representative who has indicated his/her understanding and acceptance.     Plan Discussed with: CRNA, Anesthesiologist and Surgeon  Anesthesia Plan Comments:         Anesthesia Quick Evaluation

## 2015-07-13 NOTE — Interval H&P Note (Signed)
History and Physical Interval Note:  07/13/2015 7:24 AM  [name]  has presented today for surgery, with the diagnosis of Osteoarthritis Left Knee  The various methods of treatment have been discussed with the patient and family. After consideration of risks, benefits and other options for treatment, the patient has consented to  Procedure(s): COMPUTER ASSISTED TOTAL KNEE ARTHROPLASTY LEFT, REMOVE ANTERIOR CRUCIATE LIGAMENT SCREWS AND STAPLES (Left) as a surgical intervention .  The patient's history has been reviewed, patient examined, no change in status, stable for surgery.  I have reviewed the patient's chart and labs.  Questions were answered to the patient's satisfaction.     [name]

## 2015-07-13 NOTE — Anesthesia Postprocedure Evaluation (Signed)
  Anesthesia Post-op Note  Patient: [name]  Procedure(s) Performed: Procedure(s): COMPUTER ASSISTED TOTAL KNEE ARTHROPLASTY LEFT, REMOVE ANTERIOR CRUCIATE LIGAMENT SCREWS AND STAPLES (Left)  Patient Location: PACU  Anesthesia Type:General and GA combined with regional for post-op pain  Level of Consciousness: awake, alert , oriented and patient cooperative  Airway and Oxygen Therapy: Patient Spontanous Breathing  Post-op Pain: mild  Post-op Assessment: Post-op Vital signs reviewed, Patient's Cardiovascular Status Stable, Respiratory Function Stable, Patent Airway, No signs of Nausea or vomiting and Pain level controlled              Post-op Vital Signs: stable  Last Vitals:  Filed Vitals:   07/13/15 0544  BP: 180/78  Pulse: 69  Temp: 68.1 C    Complications: No apparent anesthesia complications

## 2015-07-13 NOTE — Anesthesia Procedure Notes (Addendum)
Procedure Name: Intubation Date/Time: 07/13/2015 7:40 AM Performed by: Rebekah Chesterfield L Pre-anesthesia Checklist: Patient identified, Emergency Drugs available, Suction available, Patient being monitored and Timeout performed Patient Re-evaluated:Patient Re-evaluated prior to inductionOxygen Delivery Method: Circle system utilized Preoxygenation: Pre-oxygenation with 100% oxygen Intubation Type: IV induction Ventilation: Mask ventilation without difficulty Laryngoscope Size: Mac and 4 Grade View: Grade I Tube type: Oral Number of attempts: 1 Airway Equipment and Method: Stylet Placement Confirmation: positive ETCO2,  ETT inserted through vocal cords under direct vision and breath sounds checked- equal and bilateral Secured at: 24 cm Tube secured with: Tape Dental Injury: Teeth and Oropharynx as per pre-operative assessment    Anesthesia Regional Block:  Adductor canal block  Pre-Anesthetic Checklist: ,, timeout performed, Correct Patient, Correct Site, Correct Laterality, Correct Procedure, Correct Position, site marked, Risks and benefits discussed,  Surgical consent,  Pre-op evaluation,  At surgeon's request and post-op pain management  Laterality: Left  Prep: Maximum Sterile Barrier Precautions used, chloraprep and alcohol swabs       Needles:  Injection technique: Single-shot  Needle Type: Stimulator Needle - 80          Additional Needles:  Procedures: Doppler guided Adductor canal block Narrative:  Start time: 07/13/2015 7:20 AM End time: 07/13/2015 7:30 AM Injection made incrementally with aspirations every 5 mL.  Performed by: Personally  Anesthesiologist: Kate Sable  Additional Notes: Pt accepts procedure w/ risks. 20cc 0.5% Marcaine w/ epi w/o difficulty or discomfort. GES

## 2015-07-13 NOTE — Progress Notes (Signed)
Orthopedic Tech Progress Note Patient Details:  [name] 29-Jul-1961 [id number] Viewed order from doctor's order list CPM Left Knee CPM Left Knee: On Left Knee Flexion (Degrees): 60 Left Knee Extension (Degrees): 0 Additional Comments: trapeze bar patient helper   Hildred Priest 07/13/2015, 11:40 AM

## 2015-07-13 NOTE — Brief Op Note (Signed)
07/13/2015  11:11 AM  PATIENT:  [name]  54 y.o. male  PRE-OPERATIVE DIAGNOSIS:  Osteoarthritis Left Knee  POST-OPERATIVE DIAGNOSIS:  Osteoarthritis Left Knee  PROCEDURE:  Procedure(s): COMPUTER ASSISTED TOTAL KNEE ARTHROPLASTY LEFT, REMOVE ANTERIOR CRUCIATE LIGAMENT SCREWS AND STAPLES (Left)  SURGEON:  Surgeon(s) and Role:    * Marybelle Killings, MD - Primary  PHYSICIAN ASSISTANT: james m. Ricard Dillon pa-c     ANESTHESIA:   regional and general  EBL:  Total I/O In: 2300 [I.V.:2300] Out: -   BLOOD ADMINISTERED:none  DRAINS: none   LOCAL MEDICATIONS USED:  NONE  SPECIMEN:  No Specimen  DISPOSITION OF SPECIMEN:  N/A  COUNTS:  YES  TOURNIQUET:   Total Tourniquet Time Documented: Thigh (Left) - 97 minutes Total: Thigh (Left) - 97 minutes    PATIENT DISPOSITION:  PACU - hemodynamically stable.

## 2015-07-13 NOTE — Transfer of Care (Signed)
Immediate Anesthesia Transfer of Care Note  Patient: [name]  Procedure(s) Performed: Procedure(s): COMPUTER ASSISTED TOTAL KNEE ARTHROPLASTY LEFT, REMOVE ANTERIOR CRUCIATE LIGAMENT SCREWS AND STAPLES (Left)  Patient Location: PACU  Anesthesia Type:General and GA combined with regional for post-op pain  Level of Consciousness: awake, alert , oriented and patient cooperative  Airway & Oxygen Therapy: Patient Spontanous Breathing and Patient connected to nasal cannula oxygen  Post-op Assessment: Report given to RN, Post -op Vital signs reviewed and stable and Patient moving all extremities  Post vital signs: Reviewed and stable  Last Vitals:  Filed Vitals:   07/13/15 0544  BP: 180/78  Pulse: 69  Temp: 48.3 C    Complications: No apparent anesthesia complications

## 2015-07-14 LAB — GLUCOSE, CAPILLARY
GLUCOSE-CAPILLARY: 209 mg/dL — AB (ref 65–99)
GLUCOSE-CAPILLARY: 185 mg/dL — AB (ref 65–99)
GLUCOSE-CAPILLARY: 268 mg/dL — AB (ref 65–99)
Glucose-Capillary: 211 mg/dL — ABNORMAL HIGH (ref 65–99)

## 2015-07-14 LAB — BASIC METABOLIC PANEL
Anion gap: 7 (ref 5–15)
BUN: 8 mg/dL (ref 6–20)
CALCIUM: 8.5 mg/dL — AB (ref 8.9–10.3)
CO2: 25 mmol/L (ref 22–32)
CREATININE: 1.03 mg/dL (ref 0.61–1.24)
Chloride: 100 mmol/L — ABNORMAL LOW (ref 101–111)
GFR calc Af Amer: >60 mL/min (ref >60)
GFR calc non Af Amer: >60 mL/min (ref >60)
GLUCOSE: 219 mg/dL — AB (ref 65–99)
Potassium: 3.7 mmol/L (ref 3.5–5.1)
Sodium: 132 mmol/L — ABNORMAL LOW (ref 135–145)

## 2015-07-14 LAB — CBC
HCT: 41 % (ref 39.0–52.0)
HEMOGLOBIN: 14.5 g/dL (ref 13.0–17.0)
MCH: 33.2 pg (ref 26.0–34.0)
MCHC: 35.4 g/dL (ref 30.0–36.0)
MCV: 93.8 fL (ref 78.0–100.0)
PLATELETS: 296 10*3/uL (ref 150–400)
RBC: 4.37 MIL/uL (ref 4.22–5.81)
RDW: 13.3 % (ref 11.5–15.5)
WBC: 13.9 10*3/uL — ABNORMAL HIGH (ref 4.0–10.5)

## 2015-07-14 NOTE — Progress Notes (Signed)
Subjective: 1 Day Post-Op Procedure(s) (LRB): COMPUTER ASSISTED TOTAL KNEE ARTHROPLASTY LEFT, REMOVE ANTERIOR CRUCIATE LIGAMENT SCREWS AND STAPLES (Left) Patient reports pain as moderate.  Tolerating diet well.  Voiding well. No chest pain or SOB.  Objective: Vital signs in last 24 hours: Temp:  [98.2 F (36.8 C)-100.9 F (38.3 C)] 100 F (37.8 C) (07/30 0407) Pulse Rate:  [77-104] 90 (07/30 0407) Resp:  [12-18] 16 (07/30 0407) BP: (145-179)/(53-77) 158/74 mmHg (07/30 0407) SpO2:  [97 %-100 %] 99 % (07/30 0407) Weight:  [139.7 kg (307 lb 15.7 oz)] 139.7 kg (307 lb 15.7 oz) (07/29 1245)  Intake/Output from previous day: 07/29 0701 - 07/30 0700 In: 4135 [P.O.:480; I.V.:3600; IV Piggyback:55] Out: 1200 [Urine:1200] Intake/Output this shift:     Recent Labs  07/14/15 0344  HGB 14.5    Recent Labs  07/14/15 0344  WBC 13.9*  RBC 4.37  HCT 41.0  PLT 296    Recent Labs  07/14/15 0344  NA 132*  K 3.7  CL 100*  CO2 25  BUN 8  CREATININE 1.03  GLUCOSE 219*  CALCIUM 8.5*   No results for input(s): LABPT, INR in the last 72 hours.   Left lower extremity: Sensation intact distally Intact pulses distally Dorsiflexion/Plantar flexion intact Incision: dressing C/D/I Compartment soft  Assessment/Plan: 1 Day Post-Op Procedure(s) (LRB): COMPUTER ASSISTED TOTAL KNEE ARTHROPLASTY LEFT, REMOVE ANTERIOR CRUCIATE LIGAMENT SCREWS AND STAPLES (Left) Up with therapy [place], [place] 07/14/2015, 8:59 AM

## 2015-07-14 NOTE — Progress Notes (Signed)
Physical Therapy Treatment Patient Details Name: [name] MRN: [id number] DOB: 09/24/1961 Today's Date: 07/14/2015    History of Present Illness Pt is a 54 y/o M s/p L TKA. Pt's PMH includes HTN, DM, back surgery.  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    PT Comments    2/2 pt's limited progress discussed w/ pt about going to ST SNF rather than immediately home upon d/c to ensure pt and caregiver safety, pt verbalized understanding and said he would discuss these plans with his wife.  RN, CM, and SW made aware of change in D/C plan.  Pt limited by fatigue, hyperthermia, L knee and R ankle pain.  Pt will benefit from continued skilled PT services to increase functional independence and safety.   Follow Up Recommendations  SNF     Equipment Recommendations  Rolling walker with 5" wheels;3in1 (PT)    Recommendations for Other Services       Precautions / Restrictions Precautions Precautions: Knee;Fall Precaution Comments: Reviewed knee precautions Required Braces or Orthoses: Knee Immobilizer - Left;Other Brace/Splint Knee Immobilizer - Left: Discontinue once straight leg raise with < 10 degree lag Other Brace/Splint: R ankle orthotic which pt wears as a result of drop foot 2/2 previous back complications, he denies any other weakness or N/T in the RLE Restrictions Weight Bearing Restrictions: Yes LLE Weight Bearing: Weight bearing as tolerated    Mobility  Bed Mobility Overal bed mobility: Needs Assistance Bed Mobility: Sit to Supine     Supine to sit: Mod assist Sit to supine: Mod assist   General bed mobility comments: Mod assist managing BLEs into bed.  Cues for technique.  Transfers Overall transfer level: Needs assistance Equipment used: Rolling walker (2 wheeled) Transfers: Sit to/from Stand Sit to Stand: From elevated surface;Mod assist         General transfer comment: Mod assist to power up,  achieved on second attempt.  Pt w/ difficulty maintaining balance while pushing through BUEs and RLE.  Max cues for technique and increased time.  Ambulation/Gait Ambulation/Gait assistance: Min assist;+2 safety/equipment Ambulation Distance (Feet): 35 Feet Assistive device: Rolling walker (2 wheeled) Gait Pattern/deviations: Step-to pattern;Trunk flexed;Antalgic;Decreased weight shift to left;Decreased stride length;Decreased stance time - left   Gait velocity interpretation: Below normal speed for age/gender General Gait Details: Pt continues to rely very heavily on RW w/ trunk flexion despite VCs to stand upright.  Severe diaphoresis again this session and fatigue limited further ambulation.   Stairs            Wheelchair Mobility    Modified Rankin (Stroke Patients Only)       Balance Overall balance assessment: Needs assistance Sitting-balance support: Feet supported;Bilateral upper extremity supported Sitting balance-Leahy Scale: Poor Sitting balance - Comments: Pt must have BUEs supported on bed and feet on floor to maintain balance in sitting this session Postural control: Posterior lean Standing balance support: Bilateral upper extremity supported;During functional activity Standing balance-Leahy Scale: Poor Standing balance comment: Very heavy reliance on RW                    Cognition Arousal/Alertness: Awake/alert Behavior During Therapy: WFL for tasks assessed/performed Overall Cognitive Status: Within Functional Limits for tasks assessed                      Exercises Total Joint Exercises Ankle Circles/Pumps: AROM;Both;15 reps;Supine Quad Sets: AROM;Both;10 reps;Supine Knee Flexion:  AROM;Left;AAROM;5 reps;Seated Goniometric ROM: 85 deg L knee flexion    General Comments General comments (skin integrity, edema, etc.): 2/2 pt's limited progress discussed w/ pt about going to ST SNF rather than immediately home upon d/c to ensure pt and  caregiver safety, pt verbalized understanding and said he would discuss these plans with his wife.  RN, CM, and SW made aware of change in D/C plan.      Pertinent Vitals/Pain Pain Assessment: 0-10 Pain Score: 3  Pain Location: L knee and R ankle Pain Descriptors / Indicators: Aching Pain Intervention(s): Limited activity within patient's tolerance;Monitored during session;Repositioned;Ice applied (ice applied to R ankle at end of session)    Home Living                      Prior Function            PT Goals (current goals can now be found in the care plan section) Acute Rehab PT Goals Patient Stated Goal: to get stronger so he can go home and enjoy his pool  PT Goal Formulation: With patient Time For Goal Achievement: 07/20/15 Potential to Achieve Goals: Good Progress towards PT goals: Progressing toward goals (very modestly)    Frequency  7X/week    PT Plan Discharge plan needs to be updated    Co-evaluation             End of Session Equipment Utilized During Treatment: Gait belt;Left knee immobilizer;Other (comment) (R orthotic) Activity Tolerance: Patient limited by fatigue Patient left: in chair;with call bell/phone within reach;in CPM     Time: 1457-1531 PT Time Calculation (min) (ACUTE ONLY): 34 min  Charges:  $Gait Training: 23-37 mins $Therapeutic Activity: 8-22 mins                    G Codes:      Joslyn Hy PT, [place] [phone] Pager: [phone] 07/14/2015, 3:48 PM

## 2015-07-14 NOTE — Progress Notes (Signed)
Physical Therapy Treatment Patient Details Name: [name] MRN: [id number] DOB: 1961-01-21 Today's Date: 07/14/2015    History of Present Illness Pt is a 54 y/o M s/p L TKA. Pt's PMH includes HTN, DM, back surgery.  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    PT Comments    Pt's progress limited 2/2 hyperthermia and severe diaphoresis after ambulating in room 15 ft.  Mr. Marchio c/o new onset of R ankle pain which may possibly be 2/2 minor sprain to R calcaneofibular ligament (see notes below).   Pt will benefit from continued skilled PT services to increase functional independence and safety.   Follow Up Recommendations  Home health PT;Supervision for mobility/OOB     Equipment Recommendations  Rolling walker with 5" wheels;3in1 (PT)    Recommendations for Other Services       Precautions / Restrictions Precautions Precautions: Knee;Fall Precaution Comments: Reviewed knee precautions Required Braces or Orthoses: Knee Immobilizer - Left;Other Brace/Splint Knee Immobilizer - Left: Discontinue once straight leg raise with < 10 degree lag Other Brace/Splint: R ankle orthotic which pt wears as a result of drop foot 2/2 previous back complications, he denies any other weakness or N/T in the RLE Restrictions Weight Bearing Restrictions: Yes LLE Weight Bearing: Weight bearing as tolerated    Mobility  Bed Mobility Overal bed mobility: Needs Assistance Bed Mobility: Supine to Sit     Supine to sit: Mod assist     General bed mobility comments: Mod assist supporting trunk posteriorly and use of bed rail after pt attempted to perform supine>sit Ind w/o success.  Pt w/ poor trunk control and required max VCs for technique.  Transfers Overall transfer level: Needs assistance Equipment used: Rolling walker (2 wheeled) Transfers: Sit to/from Stand Sit to Stand: Min assist;From elevated surface         General transfer  comment: Min assist to power up to standing and to steady RW on floor.  Cues for hand placement and technique.    Ambulation/Gait Ambulation/Gait assistance: Min guard Ambulation Distance (Feet): 15 Feet Assistive device: Rolling walker (2 wheeled) Gait Pattern/deviations: Step-to pattern;Antalgic;Trunk flexed;Decreased weight shift to left;Decreased stride length;Decreased stance time - left   Gait velocity interpretation: Below normal speed for age/gender General Gait Details: Cues to bring feet off floor during swing phase to avoid shuffle and to stand upright.  Pt relying heavily on RW for support.  After ambulating 15 ft pt w/ severe diaphoresis and requests to sit down.   Stairs            Wheelchair Mobility    Modified Rankin (Stroke Patients Only)       Balance Overall balance assessment: Needs assistance Sitting-balance support: Bilateral upper extremity supported;Feet supported Sitting balance-Leahy Scale: Poor Sitting balance - Comments: Pt must have BUEs supported on bed and feet on floor to maintain balance in sitting this session Postural control: Posterior lean Standing balance support: Bilateral upper extremity supported;During functional activity Standing balance-Leahy Scale: Poor Standing balance comment: Relies heavily on RW for support                    Cognition Arousal/Alertness: Awake/alert Behavior During Therapy: WFL for tasks assessed/performed Overall Cognitive Status: Within Functional Limits for tasks assessed                      Exercises Total Joint Exercises Ankle Circles/Pumps: AROM;Both;15 reps;Supine Quad Sets:  AROM;Both;10 reps;Supine    General Comments General comments (skin integrity, edema, etc.): Pt c/o R ankle pain that is TTP distal to lateral malleolus over calcaneofibular ligament.  Talar tilt test positive.  Pt does not remember hurting his ankle but says it may have been injured when he was pushing  through his R foot during bed mobility today or yesterday.  Likely minor sprain to R calcaneofibular ligament.  RN notified, will continue to monitor.  Pt reports the pain decreasing w/ mobility, "it feels less stiff".      Pertinent Vitals/Pain Pain Assessment: 0-10 Pain Score: 4  Pain Location: L knee Pain Descriptors / Indicators: Aching Pain Intervention(s): Limited activity within patient's tolerance;Monitored during session;Repositioned    Home Living                      Prior Function            PT Goals (current goals can now be found in the care plan section) Acute Rehab PT Goals Patient Stated Goal: to get stronger so he can go home and enjoy his pool  PT Goal Formulation: With patient Time For Goal Achievement: 07/20/15 Potential to Achieve Goals: Good Progress towards PT goals: Progressing toward goals    Frequency  7X/week    PT Plan Current plan remains appropriate    Co-evaluation             End of Session Equipment Utilized During Treatment: Gait belt;Left knee immobilizer;Other (comment) (R orthotic) Activity Tolerance: Patient limited by fatigue Patient left: in chair;with call bell/phone within reach     Time: 1201-1231 PT Time Calculation (min) (ACUTE ONLY): 30 min  Charges:  $Gait Training: 8-22 mins $Therapeutic Activity: 8-22 mins                    G Codes:      Joslyn Hy PT, [place] [phone] Pager: [phone] 07/14/2015, 12:45 PM

## 2015-07-14 NOTE — Progress Notes (Signed)
OT Cancellation Note and Discharge  Patient Details Name: [name] MRN: [id number] DOB: 05-28-1961   Cancelled Treatment:    Reason Eval/Treat Not Completed: OT screened, no needs identified, will sign off.  Pt has had a previous knee sx and back surgery he and wife feel they can manage BADLs without OT eval.  Almon Register [phone] 07/14/2015, 9:30 AM

## 2015-07-15 LAB — CBC
HEMATOCRIT: 40.2 % (ref 39.0–52.0)
Hemoglobin: 13.9 g/dL (ref 13.0–17.0)
MCH: 32.6 pg (ref 26.0–34.0)
MCHC: 34.6 g/dL (ref 30.0–36.0)
MCV: 94.4 fL (ref 78.0–100.0)
Platelets: 250 10*3/uL (ref 150–400)
RBC: 4.26 MIL/uL (ref 4.22–5.81)
RDW: 13.4 % (ref 11.5–15.5)
WBC: 15.8 10*3/uL — AB (ref 4.0–10.5)

## 2015-07-15 LAB — BASIC METABOLIC PANEL
ANION GAP: 8 (ref 5–15)
BUN: 11 mg/dL (ref 6–20)
CHLORIDE: 100 mmol/L — AB (ref 101–111)
CO2: 27 mmol/L (ref 22–32)
Calcium: 8.6 mg/dL — ABNORMAL LOW (ref 8.9–10.3)
Creatinine, Ser: 1.08 mg/dL (ref 0.61–1.24)
Glucose, Bld: 124 mg/dL — ABNORMAL HIGH (ref 65–99)
Potassium: 3.5 mmol/L (ref 3.5–5.1)
Sodium: 135 mmol/L (ref 135–145)

## 2015-07-15 LAB — GLUCOSE, CAPILLARY
GLUCOSE-CAPILLARY: 131 mg/dL — AB (ref 65–99)
GLUCOSE-CAPILLARY: 174 mg/dL — AB (ref 65–99)
Glucose-Capillary: 221 mg/dL — ABNORMAL HIGH (ref 65–99)
Glucose-Capillary: 197 mg/dL — ABNORMAL HIGH (ref 65–99)

## 2015-07-15 NOTE — Progress Notes (Signed)
Physical Therapy Treatment Patient Details Name: [name] MRN: [id number] DOB: Jul 11, 1961 Today's Date: 07/15/2015    History of Present Illness Pt is a 54 y/o M s/p L TKA. Pt's PMH includes HTN, DM, back surgery.  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    PT Comments    Pt with excellent progress today, performed transfers with min A and ambulated 50' with min A and chair behind for safety. Will need a bariatric RW at d/c for safety and confidence. May be able to go home with HHPT if continues as he has done this morning but not changing SNF rec yet.   Follow Up Recommendations  SNF     Equipment Recommendations  Rolling walker with 5" wheels;3in1 (PT)    Recommendations for Other Services OT consult     Precautions / Restrictions Precautions Precautions: Knee;Fall Precaution Comments: Reviewed knee precautions Required Braces or Orthoses: Knee Immobilizer - Left;Other Brace/Splint (d/c'ed today 7/31) Knee Immobilizer - Left: Discontinue once straight leg raise with < 10 degree lag Other Brace/Splint: R ankle orthotic which pt wears as a result of drop foot 2/2 previous back complications, he denies any other weakness or N/T in the RLE Restrictions Weight Bearing Restrictions: Yes LLE Weight Bearing: Weight bearing as tolerated    Mobility  Bed Mobility Overal bed mobility: Needs Assistance Bed Mobility: Supine to Sit     Supine to sit: Min assist     General bed mobility comments: pt able to initiate motion on his own but could not get left LE all the way off bed. Min A given to move left leg.   Transfers Overall transfer level: Needs assistance Equipment used: Rolling walker (2 wheeled) Transfers: Sit to/from Stand Sit to Stand: From elevated surface;Min assist         General transfer comment: min A for power up. Without KI, pt was able to use LLE more and stand with less assistance  today  Ambulation/Gait Ambulation/Gait assistance: Min assist;+2 safety/equipment Ambulation Distance (Feet): 50 Feet Assistive device: Rolling walker (2 wheeled) Gait Pattern/deviations: Step-to pattern;Decreased stride length Gait velocity: decreased   General Gait Details: worked on letting left knee bend with swing through as well as elevating trunk during ambulation. Changed RW to a bari RW which helped pt's confidence and thus, his independence   Financial trader Rankin (Stroke Patients Only)       Balance Overall balance assessment: Needs assistance Sitting-balance support: No upper extremity supported Sitting balance-Leahy Scale: Good Sitting balance - Comments: much improved   Standing balance support: Bilateral upper extremity supported Standing balance-Leahy Scale: Poor Standing balance comment: still relies on RW for maintaining standing                    Cognition Arousal/Alertness: Awake/alert Behavior During Therapy: WFL for tasks assessed/performed Overall Cognitive Status: Within Functional Limits for tasks assessed                      Exercises Total Joint Exercises Ankle Circles/Pumps: AROM;Both;10 reps;Seated Quad Sets: AROM;Both;10 reps;Seated Gluteal Sets: AROM;Both;10 reps;Seated Straight Leg Raises: AROM;Left;5 reps;Supine Knee Flexion: AROM;Left;5 reps;Seated Goniometric ROM: 5-90 left knee    General Comments General comments (skin integrity, edema, etc.): pt with much improvememnt today      Pertinent Vitals/Pain Pain Assessment: 0-10 Pain Score:  3  Pain Location: left knee Pain Descriptors / Indicators: Aching Pain Intervention(s): Monitored during session;Premedicated before session    Home Living                      Prior Function            PT Goals (current goals can now be found in the care plan section) Acute Rehab PT Goals Patient Stated Goal: to get  stronger so he can go home and enjoy his pool  PT Goal Formulation: With patient Time For Goal Achievement: 07/20/15 Potential to Achieve Goals: Good Progress towards PT goals: Progressing toward goals    Frequency  7X/week    PT Plan Current plan remains appropriate    Co-evaluation             End of Session Equipment Utilized During Treatment: Gait belt;Other (comment) (right ankle brace and AFO) Activity Tolerance: Patient tolerated treatment well Patient left: in chair;with call bell/phone within reach;with family/visitor present     Time: 0935-1010 PT Time Calculation (min) (ACUTE ONLY): 35 min  Charges:  $Gait Training: 23-37 mins                    G Codes:     Leighton Roach, PT  Acute Rehab Services  [place], [place] 07/15/2015, 11:23 AM

## 2015-07-15 NOTE — Progress Notes (Signed)
Subjective: 2 Days Post-Op Procedure(s) (LRB): COMPUTER ASSISTED TOTAL KNEE ARTHROPLASTY LEFT, REMOVE ANTERIOR CRUCIATE LIGAMENT SCREWS AND STAPLES (Left) Patient reports pain as moderate.  Working slowly with therapy. H/H stable.  Objective: Vital signs in last 24 hours: Temp:  [99.5 F (37.5 C)-100 F (37.8 C)] 99.5 F (37.5 C) (07/31 0526) Pulse Rate:  [93-105] 93 (07/31 0526) Resp:  [18] 18 (07/31 0526) BP: (138-143)/(62-69) 143/69 mmHg (07/31 0526) SpO2:  [94 %-98 %] 98 % (07/31 0526)  Intake/Output from previous day: 07/30 0701 - 07/31 0700 In: 720 [P.O.:720] Out: 850 [Urine:850] Intake/Output this shift:     Recent Labs  07/14/15 0344 07/15/15 0550  HGB 14.5 13.9    Recent Labs  07/14/15 0344 07/15/15 0550  WBC 13.9* 15.8*  RBC 4.37 4.26  HCT 41.0 40.2  PLT 296 250    Recent Labs  07/14/15 0344 07/15/15 0550  NA 132* 135  K 3.7 3.5  CL 100* 100*  CO2 25 27  BUN 8 11  CREATININE 1.03 1.08  GLUCOSE 219* 124*  CALCIUM 8.5* 8.6*   No results for input(s): LABPT, INR in the last 72 hours.  Sensation intact distally Intact pulses distally Dorsiflexion/Plantar flexion intact Incision: scant drainage No cellulitis present Compartment soft  Assessment/Plan: 2 Days Post-Op Procedure(s) (LRB): COMPUTER ASSISTED TOTAL KNEE ARTHROPLASTY LEFT, REMOVE ANTERIOR CRUCIATE LIGAMENT SCREWS AND STAPLES (Left) Up with therapy Discharge home with home health tomorrow.  Zoeann Mol Y 07/15/2015, 9:17 AM

## 2015-07-15 NOTE — Progress Notes (Signed)
Physical Therapy Treatment Patient Details Name: [name] MRN: [id number] DOB: 1961/06/29 Today's Date: 07/15/2015    History of Present Illness Pt is a 54 y/o M s/p L TKA. Pt's PMH includes HTN, DM, back surgery.  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    PT Comments    Pt ambulated 74' with RW and min-guard A. Continues to progress well with mobility but needs mod A for standing from some surfaces and when fatigued, continue to recommend ST-SNF as pt's wife will be at work in the day. PT will continue to follow.   Follow Up Recommendations  SNF     Equipment Recommendations  Rolling walker with 5" wheels;3in1 (PT)    Recommendations for Other Services OT consult     Precautions / Restrictions Precautions Precautions: Knee;Fall Precaution Booklet Issued: No Required Braces or Orthoses: Other Brace/Splint Other Brace/Splint: R ankle orthotic which pt wears as a result of drop foot 2/2 previous back complications, he denies any other weakness or N/T in the RLE Restrictions Weight Bearing Restrictions: Yes LLE Weight Bearing: Weight bearing as tolerated    Mobility  Bed Mobility Overal bed mobility: Needs Assistance Bed Mobility: Sit to Supine       Sit to supine: Min assist   General bed mobility comments: min A to LLE for return to bed. Pt able to scoot to center of bed and into CPM with LLE supported  Transfers Overall transfer level: Needs assistance Equipment used: Rolling walker (2 wheeled) Transfers: Sit to/from Stand Sit to Stand: Mod assist         General transfer comment: mod A for power up needed for sit to stand from recliner  Ambulation/Gait Ambulation/Gait assistance: Min guard Ambulation Distance (Feet): 45 Feet Assistive device: Rolling walker (2 wheeled) Gait Pattern/deviations: Step-through pattern;Decreased stride length;Decreased weight shift to left Gait velocity:  decreased Gait velocity interpretation: <1.8 ft/sec, indicative of risk for recurrent falls General Gait Details: step through pattern emerging, worked on heel strike on left. Again worked on keeping trunk elevated during Advertising account executive Rankin (Stroke Patients Only)       Balance Overall balance assessment: Needs assistance Sitting-balance support: No upper extremity supported Sitting balance-Leahy Scale: Good     Standing balance support: No upper extremity supported Standing balance-Leahy Scale: Fair Standing balance comment: able to perform static standing this afternoon without UE support                    Cognition Arousal/Alertness: Awake/alert Behavior During Therapy: WFL for tasks assessed/performed Overall Cognitive Status: Within Functional Limits for tasks assessed                      Exercises Total Joint Exercises Ankle Circles/Pumps: AROM;Both;10 reps;Seated Quad Sets: AROM;Both;10 reps;Seated Gluteal Sets: AROM;Both;10 reps;Seated Heel Slides: AAROM;Left;5 reps;Seated Straight Leg Raises: AROM;Left;5 reps;Seated Long Arc Quad: AAROM;Left;10 reps;Seated Knee Flexion: AROM;Left;Seated Goniometric ROM: 5-90    General Comments General comments (skin integrity, edema, etc.): pt placed in CPM 0-75 degrees at 1530      Pertinent Vitals/Pain Pain Assessment: 0-10 Pain Score: 3  Pain Location: left knee Pain Intervention(s): Limited activity within patient's tolerance;Monitored during session;Premedicated before session    Home Living  Prior Function            PT Goals (current goals can now be found in the care plan section) Acute Rehab PT Goals Patient Stated Goal: to get stronger so he can go home and enjoy his pool  PT Goal Formulation: With patient Time For Goal Achievement: 07/20/15 Potential to Achieve Goals: Good Progress towards PT goals:  Progressing toward goals    Frequency  7X/week    PT Plan Current plan remains appropriate    Co-evaluation             End of Session Equipment Utilized During Treatment: Gait belt Activity Tolerance: Patient tolerated treatment well Patient left: in bed;with call bell/phone within reach;with family/visitor present     Time: 1500-1536 PT Time Calculation (min) (ACUTE ONLY): 36 min  Charges:  $Gait Training: 8-22 mins $Therapeutic Exercise: 8-22 mins                    G Codes:     Leighton Roach, PT  Acute Rehab Services  [phone]  Leighton Roach 07/15/2015, 4:03 PM

## 2015-07-16 ENCOUNTER — Encounter (HOSPITAL_COMMUNITY): Payer: Self-pay | Admitting: Orthopaedic Surgery

## 2015-07-16 LAB — CBC
HCT: 38.1 % — ABNORMAL LOW (ref 39.0–52.0)
HEMOGLOBIN: 13.3 g/dL (ref 13.0–17.0)
MCH: 32.8 pg (ref 26.0–34.0)
MCHC: 34.9 g/dL (ref 30.0–36.0)
MCV: 94.1 fL (ref 78.0–100.0)
PLATELETS: 258 10*3/uL (ref 150–400)
RBC: 4.05 MIL/uL — AB (ref 4.22–5.81)
RDW: 13.4 % (ref 11.5–15.5)
WBC: 16.2 10*3/uL — ABNORMAL HIGH (ref 4.0–10.5)

## 2015-07-16 LAB — GLUCOSE, CAPILLARY
Glucose-Capillary: 107 mg/dL — ABNORMAL HIGH (ref 65–99)
Glucose-Capillary: 183 mg/dL — ABNORMAL HIGH (ref 65–99)
Glucose-Capillary: 220 mg/dL — ABNORMAL HIGH (ref 65–99)

## 2015-07-16 NOTE — Progress Notes (Signed)
Physical Therapy Treatment Patient Details Name: [name] MRN: [id number] DOB: 27-Mar-1961 Today's Date: 07/16/2015    History of Present Illness Pt is a 54 y/o M s/p L TKA. Pt's PMH includes HTN, DM, back surgery.  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    PT Comments    Patient is making good progress with PT.  From a mobility standpoint anticipate patient will be ready for DC home today. Pt will benefit from continued skilled PT services in the [place] setting to increase functional independence and safety.     Follow Up Recommendations  Home health PT;Supervision for mobility/OOB     Equipment Recommendations  Rolling walker with 5" wheels;3in1 (PT) (Bariatric RW)    Recommendations for Other Services       Precautions / Restrictions Precautions Precautions: Knee;Fall Precaution Booklet Issued: No Required Braces or Orthoses: Other Brace/Splint Knee Immobilizer - Left: Discontinue once straight leg raise with < 10 degree lag Other Brace/Splint: R ankle orthotic which pt wears as a result of drop foot 2/2 previous back complications, he denies any other weakness or N/T in the RLE Restrictions Weight Bearing Restrictions: Yes LLE Weight Bearing: Weight bearing as tolerated    Mobility  Bed Mobility               General bed mobility comments: in recliner  Transfers Overall transfer level: Needs assistance Equipment used: Rolling walker (2 wheeled) Transfers: Sit to/from Stand Sit to Stand: Supervision         General transfer comment: Supervision for safety.  Increased time but pt w/ good technique.  Ambulation/Gait Ambulation/Gait assistance: Min guard Ambulation Distance (Feet): 100 Feet Assistive device: Rolling walker (2 wheeled) Gait Pattern/deviations: Step-through pattern;Antalgic;Trunk flexed;Decreased weight shift to left;Decreased stride length;Decreased stance time - left Gait velocity:  decreased Gait velocity interpretation: Below normal speed for age/gender General Gait Details: Pt achieved step through gait pattern w/ cues for heel strik.  Cues for standing upright as pt has tendency toward trunk flexion and leaning heavily on RW.     Stairs Stairs: Yes Stairs assistance: Min guard Stair Management: Two rails;Step to pattern;Forwards Number of Stairs: 2 General stair comments: VCs for technique.  Increased time as pt is cautious.  Wheelchair Mobility    Modified Rankin (Stroke Patients Only)       Balance Overall balance assessment: Needs assistance Sitting-balance support: No upper extremity supported;Feet supported Sitting balance-Leahy Scale: Good     Standing balance support: Bilateral upper extremity supported;During functional activity Standing balance-Leahy Scale: Fair                      Cognition Arousal/Alertness: Awake/alert Behavior During Therapy: WFL for tasks assessed/performed Overall Cognitive Status: Within Functional Limits for tasks assessed                      Exercises Total Joint Exercises Long Arc Quad: AAROM;Left;10 reps;Seated Knee Flexion: AROM;Left;15 reps;Standing Goniometric ROM: 5-87 Marching in Standing: AROM;Both;10 reps;Standing    General Comments General comments (skin integrity, edema, etc.): Pt was told by PA this morning that he can go home.  Based on pt's progress this session pt will likely be safe to go home if pt's second session today goes well.      Pertinent Vitals/Pain Pain Assessment: 0-10 Pain Score: 2  Pain Location: L knee Pain Descriptors / Indicators: Sore Pain Intervention(s): Limited activity within  patient's tolerance;Monitored during session;Repositioned;Ice applied    Home Living                      Prior Function            PT Goals (current goals can now be found in the care plan section) Acute Rehab PT Goals Patient Stated Goal: to get stronger so  he can go home and enjoy his pool  PT Goal Formulation: With patient Time For Goal Achievement: 07/20/15 Potential to Achieve Goals: Good Progress towards PT goals: Progressing toward goals    Frequency  7X/week    PT Plan Current plan remains appropriate    Co-evaluation             End of Session Equipment Utilized During Treatment: Gait belt (right ankle brace and AFO) Activity Tolerance: Patient tolerated treatment well Patient left: with call bell/phone within reach;in chair     Time: 3825-0539 PT Time Calculation (min) (ACUTE ONLY): 29 min  Charges:  $Gait Training: 8-22 mins $Therapeutic Exercise: 8-22 mins                    G Codes:      Joslyn Hy PT, [place] [phone] Pager: [phone] 07/16/2015, 3:14 PM

## 2015-07-16 NOTE — Discharge Summary (Signed)
Patient ID: [name] MRN: [id number] DOB/AGE: 1961-08-09 54 y.o.  Admit date: 07/13/2015 Discharge date: 07/16/2015  Admission Diagnoses:  Active Problems:   Total knee replacement status   Discharge Diagnoses:  Active Problems:   Total knee replacement status  status post Procedure(s): COMPUTER ASSISTED TOTAL KNEE ARTHROPLASTY LEFT, REMOVE ANTERIOR CRUCIATE LIGAMENT SCREWS AND STAPLES  Past Medical History  Diagnosis Date  . Hypertension   . Diabetes mellitus without complication     Surgeries: Procedure(s): COMPUTER ASSISTED TOTAL KNEE ARTHROPLASTY LEFT, REMOVE ANTERIOR CRUCIATE LIGAMENT SCREWS AND STAPLES on 07/13/2015   Consultants:    Discharged Condition: Improved  Hospital Course: [name] is an 54 y.o. male who was admitted 07/13/2015 for operative treatment of left knee djd. Patient failed conservative treatments (please see the history and physical for the specifics) and had severe unremitting pain that affects sleep, daily activities and work/hobbies. After pre-op clearance, the patient was taken to the operating room on 07/13/2015 and underwent  Procedure(s): COMPUTER ASSISTED TOTAL KNEE ARTHROPLASTY LEFT, REMOVE ANTERIOR CRUCIATE LIGAMENT SCREWS AND STAPLES.    Patient was given perioperative antibiotics: Anti-infectives    Start     Dose/Rate Route Frequency Ordered Stop   07/13/15 0700  ceFAZolin (ANCEF) 3 g in dextrose 5 % 50 mL IVPB     3 g 160 mL/hr over 30 Minutes Intravenous To [facility] 07/12/15 1024 07/13/15 0755       Patient was given sequential compression devices and early ambulation to prevent DVT.   Patient benefited maximally from hospital stay and there were no complications. At the time of discharge, the patient was urinating/moving their bowels without difficulty, tolerating a regular diet, pain is controlled with oral pain medications and they have been cleared by PT/OT.   Recent vital signs: Patient Vitals for the past  24 hrs:  BP Temp Temp src Pulse Resp SpO2  07/16/15 0516 (!) 159/75 mmHg 98.7 F (37.1 C) Oral 89 17 97 %  07/15/15 1959 132/68 mmHg 99.1 F (37.3 C) Oral 97 17 99 %     Recent laboratory studies:  Recent Labs  07/14/15 0344 07/15/15 0550 07/16/15 0440  WBC 13.9* 15.8* 16.2*  HGB 14.5 13.9 13.3  HCT 41.0 40.2 38.1*  PLT 296 250 258  NA 132* 135  --   K 3.7 3.5  --   CL 100* 100*  --   CO2 25 27  --   BUN 8 11  --   CREATININE 1.03 1.08  --   GLUCOSE 219* 124*  --   CALCIUM 8.5* 8.6*  --      Discharge Medications:     Medication List    STOP taking these medications        ibuprofen 800 MG tablet  Commonly known as:  ADVIL,MOTRIN      TAKE these medications        amLODipine 10 MG tablet  Commonly known as:  NORVASC  Take 10 mg by mouth daily.     aspirin EC 325 MG tablet  Take 1 tablet (325 mg total) by mouth daily.     glipiZIDE 10 MG 24 hr tablet  Commonly known as:  GLUCOTROL XL  Take 10 mg by mouth 2 (two) times daily.     lisinopril 40 MG tablet  Commonly known as:  PRINIVIL,ZESTRIL  Take 40 mg by mouth daily.     metFORMIN 1000 MG tablet  Commonly known as:  GLUCOPHAGE  Take 1,000  mg by mouth 2 (two) times daily.     methocarbamol 500 MG tablet  Commonly known as:  ROBAXIN  Take 1 tablet (500 mg total) by mouth every 6 (six) hours as needed for muscle spasms.     methocarbamol 500 MG tablet  Commonly known as:  ROBAXIN  Take 1 tablet (500 mg total) by mouth every 6 (six) hours as needed for muscle spasms.     oxyCODONE-acetaminophen 7.5-325 MG per tablet  Commonly known as:  PERCOCET  Take 1 tablet by mouth every 6 (six) hours as needed for severe pain.     pioglitazone 45 MG tablet  Commonly known as:  ACTOS  Take 45 mg by mouth daily.     pravastatin 10 MG tablet  Commonly known as:  PRAVACHOL  Take 10 mg by mouth daily.        Diagnostic Studies: Dg Chest 2 View  07/02/2015   CLINICAL DATA:  Preoperative examination.   History of hypertension.  EXAM: CHEST  2 VIEW  COMPARISON:  PA and lateral chest 06/07/2010.  FINDINGS: The lungs are clear. Heart size is normal. There is no pneumothorax or pleural effusion. No focal bony abnormality is identified.  IMPRESSION: Negative chest.   Electronically Signed   By: Inge Rise M.D.   On: 07/02/2015 16:05          Follow-up Information    Schedule an appointment as soon as possible for a visit with Marybelle Killings, MD.   Specialty:  Orthopedic Surgery   Why:  need return office visit 2 weeks postop   Contact information:   [place] [address] [phone]       Discharge Plan:  discharge to home  Disposition:     Signed: Lanae Crumbly  07/16/2015, 10:25 AM

## 2015-07-16 NOTE — Progress Notes (Signed)
Physical Therapy Treatment Patient Details Name: [name] MRN: [id number] DOB: [birth year] Today's Date: 07/16/2015    History of Present Illness Pt is a 54 y/o M s/p L TKA. Pt's PMH includes HTN, DM, back surgery.  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    PT Comments    Pt made good progress this session and demonstrated ability to ambulate 80 ft and ascend/descend 2 steps this session.  However pt will benefit from at least one more PT session before d/c to home to ensure pt's stability and safety w/ wife's assist at home.  D/c recommendation updated.  Pt will benefit from continued skilled PT services to increase functional independence and safety.   Follow Up Recommendations  Home health PT;Supervision for mobility/OOB     Equipment Recommendations  Rolling walker with 5" wheels;3in1 (PT) (Bariatric RW)    Recommendations for Other Services       Precautions / Restrictions Precautions Precautions: Knee;Fall Precaution Booklet Issued: No Required Braces or Orthoses: Other Brace/Splint Knee Immobilizer - Left: Discontinue once straight leg raise with < 10 degree lag Other Brace/Splint: R ankle orthotic which pt wears as a result of drop foot 2/2 previous back complications, he denies any other weakness or N/T in the RLE Restrictions Weight Bearing Restrictions: Yes LLE Weight Bearing: Weight bearing as tolerated    Mobility  Bed Mobility               General bed mobility comments: in recliner  Transfers Overall transfer level: Needs assistance Equipment used: Rolling walker (2 wheeled) Transfers: Sit to/from Stand Sit to Stand: Min guard         General transfer comment: Min guard for safety.  Pt w/ good technique scooting toward edge of seat and pushing from BUEs.  No cues necessary.  Ambulation/Gait Ambulation/Gait assistance: Min guard Ambulation Distance (Feet): 80 Feet Assistive device:  Rolling walker (2 wheeled) Gait Pattern/deviations: Step-to pattern;Step-through pattern;Decreased stride length;Antalgic;Trunk flexed;Decreased weight shift to left;Decreased stance time - left (progressing to step through gait pattern) Gait velocity: decreased Gait velocity interpretation: Below normal speed for age/gender General Gait Details: step through pattern continuing to emerge.  Again worked on keeping trunk elevated during ambulation   Stairs Stairs: Yes Stairs assistance: Min guard Stair Management: Two rails;Step to pattern;Forwards Number of Stairs: 2 General stair comments: VCs for technique.  Increased time as pt is cautious.  Wheelchair Mobility    Modified Rankin (Stroke Patients Only)       Balance Overall balance assessment: Needs assistance Sitting-balance support: No upper extremity supported;Feet supported Sitting balance-Leahy Scale: Good     Standing balance support: Bilateral upper extremity supported;During functional activity Standing balance-Leahy Scale: Fair                      Cognition Arousal/Alertness: Awake/alert Behavior During Therapy: WFL for tasks assessed/performed Overall Cognitive Status: Within Functional Limits for tasks assessed                      Exercises Total Joint Exercises Long Arc Quad: AAROM;Left;10 reps;Seated Knee Flexion: AROM;Left;Seated;5 reps Goniometric ROM: 5-87    General Comments General comments (skin integrity, edema, etc.): Pt was told by PA this morning that he can go home.  Based on pt's progress this session pt will likely be safe to go home if pt's second session today goes well.  Pertinent Vitals/Pain Pain Assessment: 0-10 Pain Score: 4  Pain Location: L knee Pain Descriptors / Indicators: Aching;Sore    Home Living                      Prior Function            PT Goals (current goals can now be found in the care plan section) Acute Rehab PT  Goals Patient Stated Goal: to get stronger so he can go home and enjoy his pool  PT Goal Formulation: With patient Time For Goal Achievement: 07/20/15 Potential to Achieve Goals: Good Progress towards PT goals: Progressing toward goals    Frequency  7X/week    PT Plan Discharge plan needs to be updated    Co-evaluation             End of Session Equipment Utilized During Treatment: Gait belt Activity Tolerance: Patient tolerated treatment well Patient left: with call bell/phone within reach;in chair     Time: 1127-1202 PT Time Calculation (min) (ACUTE ONLY): 35 min  Charges:  $Gait Training: 23-37 mins                    G Codes:      Joslyn Hy PT, [place] [phone] Pager: [phone] 07/16/2015, 12:12 PM

## 2015-07-16 NOTE — Progress Notes (Signed)
Subjective: Doing well.  Pan controlled.  Ready to go home.     Objective: Vital signs in last 24 hours: Temp:  [98.7 F (37.1 C)-99.1 F (37.3 C)] 98.7 F (37.1 C) (08/01 0516) Pulse Rate:  [89-97] 89 (08/01 0516) Resp:  [17] 17 (08/01 0516) BP: (132-159)/(68-75) 159/75 mmHg (08/01 0516) SpO2:  [97 %-99 %] 97 % (08/01 0516)  Intake/Output from previous day: 07/31 0701 - 08/01 0700 In: 820 [P.O.:820] Out: 800 [Urine:800] Intake/Output this shift: Total I/O In: 240 [P.O.:240] Out: 900 [Urine:900]   Recent Labs  07/14/15 0344 07/15/15 0550 07/16/15 0440  HGB 14.5 13.9 13.3    Recent Labs  07/15/15 0550 07/16/15 0440  WBC 15.8* 16.2*  RBC 4.26 4.05*  HCT 40.2 38.1*  PLT 250 258    Recent Labs  07/14/15 0344 07/15/15 0550  NA 132* 135  K 3.7 3.5  CL 100* 100*  CO2 25 27  BUN 8 11  CREATININE 1.03 1.08  GLUCOSE 219* 124*  CALCIUM 8.5* 8.6*   No results for input(s): LABPT, INR in the last 72 hours.  Neurovascular intact Dorsiflexion/Plantar flexion intact No cellulitis present Compartment soft  Assessment/Plan: D/c home today after works with PT.  F/u in office 2 weeks postop.  Scripts for percocet, robaxin, and aspirin (postop dvt prophylaxis) on chart.     Delayza Lungren M 07/16/2015, 10:11 AM

## 2015-08-06 ENCOUNTER — Ambulatory Visit: Payer: 59 | Attending: Orthopaedic Surgery | Admitting: Physical Therapy

## 2015-08-06 DIAGNOSIS — M25562 Pain in left knee: Secondary | ICD-10-CM

## 2015-08-06 DIAGNOSIS — R29898 Other symptoms and signs involving the musculoskeletal system: Secondary | ICD-10-CM | POA: Insufficient documentation

## 2015-08-06 DIAGNOSIS — R262 Difficulty in walking, not elsewhere classified: Secondary | ICD-10-CM | POA: Diagnosis present

## 2015-08-06 DIAGNOSIS — M25662 Stiffness of left knee, not elsewhere classified: Secondary | ICD-10-CM

## 2015-08-06 DIAGNOSIS — M25462 Effusion, left knee: Secondary | ICD-10-CM | POA: Insufficient documentation

## 2015-08-06 DIAGNOSIS — R269 Unspecified abnormalities of gait and mobility: Secondary | ICD-10-CM | POA: Diagnosis present

## 2015-08-06 NOTE — Therapy (Signed)
[facility] [address]  [place], [place], [address] Phone: [phone]   Fax:  [phone]  Physical Therapy Evaluation  Patient Details  Name: [name] MRN: [id number] Date of Birth: 1961-09-20 Referring Provider:  Marybelle Killings, MD  Encounter Date: 08/06/2015      PT End of Session - 08/06/15 1809    Visit Number 1   Number of Visits 12   Date for PT Re-Evaluation 09/17/15   PT Start Time 5366   PT Stop Time 1618   PT Time Calculation (min) 47 min   Activity Tolerance Patient tolerated treatment well   Behavior During Therapy [facility] for tasks assessed/performed      Past Medical History  Diagnosis Date  . Hypertension   . Diabetes mellitus without complication     Past Surgical History  Procedure Laterality Date  . Knee arthroscopy Left 96  . Cholecystectomy  10  . Back surgery  09  . Eye surgery Bilateral     strabismus   54 yrs old  . Knee arthroplasty Left 07/13/2015    Procedure: COMPUTER ASSISTED TOTAL KNEE ARTHROPLASTY LEFT, REMOVE ANTERIOR CRUCIATE LIGAMENT SCREWS AND STAPLES;  Surgeon: Marybelle Killings, MD;  Location: [facility];  Service: Orthopedics;  Laterality: Left;    There were no vitals filed for this visit.  Visit Diagnosis:  Stiffness of knee joint, left  Left knee pain  Swelling of left knee joint  Weakness of left leg  Difficulty walking  Abnormality of gait      Subjective Assessment - 08/06/15 1539    Subjective Patient reports repeated injuries to left knee playing sports dating back to 1983. Left ACL repair done in 1992. Also underwent lumbar surgery ~7-8 years ago where the doctor "put a cage in my back", after which patient developed right foot drop ~3 yrs ago. Patient reports weaned self from RW to [facility] [employer] last Friday.   Pertinent History Left TKR 07/13/15 with removal of prior ACL repair hardware   Patient Stated Goals "To get back to walking without the cane and go back  to work."   Currently in Pain? Yes   Pain Score 3    Pain Location Knee   Pain Orientation Left;Anterior   Pain Descriptors / Indicators Aching   Pain Onset 1 to 4 weeks ago   Pain Frequency Intermittent   Aggravating Factors  walking alot   Pain Relieving Factors ice   Multiple Pain Sites No            [facility] PT Assessment - 08/06/15 1548    Assessment   Medical Diagnosis Left TKR   Onset Date/Surgical Date 07/13/15   Next MD Visit 08/24/15   Prior Therapy HHPT ending 8/19   Precautions   Required Braces or Orthoses Other Brace/Splint   Other Brace/Splint Right AFO for foot drop related to nerve injury from prior back surgery   [place] residence   Living Arrangements Spouse/significant other   [place] to enter   Entrance Stairs-Number of Steps 5   Entrance Stairs-Rails Right;Left;Can reach both   [place] One level   Prior Function   Level of Independence Independent   Vocation Full time employment   Vocation Requirements Standing, walking, lifting - Museum/gallery conservator, 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, fish, camp   Observation/Other Assessments   Focus on Therapeutic Outcomes (FOTO)  28% (72% limitation); predicted  56% (44% limitation)   ROM / Strength   AROM / PROM / Strength AROM;PROM;Strength   AROM   AROM Assessment Site Knee   Right/Left Knee Left   Left Knee Extension 25   Left Knee Flexion 94   PROM   PROM Assessment Site Knee   Right/Left Knee Left   Left Knee Extension 10   Left Knee Flexion 100   Strength   Strength Assessment Site Hip;Knee   Right/Left Hip Left   Left Hip Flexion 4/5   Left Hip Extension 3+/5   Left Hip ABduction 3/5   Right/Left Knee Left   Left Knee Flexion 3+/5   Left Knee Extension 3+/5   Ambulation/Gait   Ambulation/Gait Yes   Ambulation/Gait Assistance 6: Modified independent (Device/Increase time)   Assistive device Small based quad cane   Gait Pattern  Step-through pattern;Decreased step length - right;Decreased step length - left;Decreased weight shift to left;Left flexed knee in stance   Stairs --  PLOF - step-to pattern                   [facility] Adult PT Treatment/Exercise - 08/06/15 1548    Exercises   Exercises Knee/Hip   Knee/Hip Exercises: Stretches   Gastroc Stretch 20 seconds;3 reps   Gastroc Stretch Limitations Combined gastroc/hamstring stretch with strap + active quad set in sitting   Knee/Hip Exercises: Standing   Hip Flexion Stengthening;Both;10 reps;Knee bent   Hip Flexion Limitations Marching   Terminal Knee Extension Strengthening;Left;10 reps;1 set   Terminal Knee Extension Limitations small ball behind knee   Hip Abduction Stengthening;Left;10 reps;Knee straight   Hip Extension Stengthening;10 reps;Knee straight                     PT Long Term Goals - 08/06/15 1900    PT LONG TERM GOAL #1   Title Independent with HEP (09/17/15)   Time 6   Period Weeks   Status New   PT LONG TERM GOAL #2   Title Left knee ROM 3-120 or greater to allow normal motion with gait and stair climbing (09/17/15)   Time 6   Period Weeks   Status New   PT LONG TERM GOAL #3   Title Left knee strength 4/5 or greater for improved stability (09/17/15)   Time 6   Period Weeks   Status New   PT LONG TERM GOAL #4   Title Ambulate on all surfaces without cane (or cane used only on left in conjuction with right AFO) with normal gait pattern  (09/17/15)   Time 6   Period Weeks   Status New               Plan - 08/06/15 1852    Clinical Impression Statement Patient is a 54 y/o male who presents to OP PT 3+ wks s/p Left TKR on 07/13/15. Patient presents to therapy using a SBQC having weaned self off RW on 08/03/15. Gait demonstrates decreased stride length bilaterally with decreased knee extension on left and use of right AFO for pre-existing right foot drop associated with prior back surgery. AROM 25-94 and PROM  10-100 with some quad lag noted. Strength 3 to 3+/5 in left hip and knee except left hip flexion 4/5.   Pt will benefit from skilled therapeutic intervention in order to improve on the following deficits Pain;Decreased range of motion;Impaired flexibility;Decreased strength;Difficulty walking;Abnormal gait;Decreased activity tolerance;Decreased balance   Rehab Potential Good   PT Frequency 2x /  week   PT Duration 6 weeks   PT Treatment/Interventions Therapeutic exercise;Passive range of motion;Manual techniques;Balance training;Scar mobilization;Therapeutic activities;Functional mobility training;Gait training;Stair training;Cryotherapy;Vasopneumatic Device;Patient/family education   PT Next Visit Plan TKR protocol - ROM, strengthening, modalities PRN   Consulted and Agree with Plan of Care Patient         Problem List Patient Active Problem List   Diagnosis Date Noted  . Total knee replacement status 07/13/2015    Percival Spanish, PT, MPT  08/06/2015, 7:13 PM  [facility] [address]  [place], [place], [address] Phone: [phone]   Fax:  [phone]

## 2015-08-10 ENCOUNTER — Ambulatory Visit: Payer: 59 | Admitting: Physical Therapy

## 2015-08-10 DIAGNOSIS — R262 Difficulty in walking, not elsewhere classified: Secondary | ICD-10-CM

## 2015-08-10 DIAGNOSIS — M25662 Stiffness of left knee, not elsewhere classified: Secondary | ICD-10-CM

## 2015-08-10 DIAGNOSIS — R269 Unspecified abnormalities of gait and mobility: Secondary | ICD-10-CM

## 2015-08-10 DIAGNOSIS — R29898 Other symptoms and signs involving the musculoskeletal system: Secondary | ICD-10-CM

## 2015-08-10 DIAGNOSIS — M25562 Pain in left knee: Secondary | ICD-10-CM

## 2015-08-10 DIAGNOSIS — M25462 Effusion, left knee: Secondary | ICD-10-CM

## 2015-08-10 NOTE — Therapy (Signed)
[facility] [address]  [place], [place], [address] Phone: [phone]   Fax:  [phone]  Physical Therapy Treatment  Patient Details  Name: [name] MRN: [id number] Date of Birth: 08-15-1961 Referring Provider:  Marybelle Killings, MD  Encounter Date: 08/10/2015      PT End of Session - 08/10/15 1054    Visit Number 2   Number of Visits 12   Date for PT Re-Evaluation 09/17/15   PT Start Time 5400   PT Stop Time 1135   PT Time Calculation (min) 43 min   Activity Tolerance Patient tolerated treatment well   Behavior During Therapy [facility] for tasks assessed/performed      Past Medical History  Diagnosis Date  . Hypertension   . Diabetes mellitus without complication     Past Surgical History  Procedure Laterality Date  . Knee arthroscopy Left 96  . Cholecystectomy  10  . Back surgery  09  . Eye surgery Bilateral     strabismus   54 yrs old  . Knee arthroplasty Left 07/13/2015    Procedure: COMPUTER ASSISTED TOTAL KNEE ARTHROPLASTY LEFT, REMOVE ANTERIOR CRUCIATE LIGAMENT SCREWS AND STAPLES;  Surgeon: Marybelle Killings, MD;  Location: [facility];  Service: Orthopedics;  Laterality: Left;    There were no vitals filed for this visit.  Visit Diagnosis:  Stiffness of knee joint, left  Left knee pain  Swelling of left knee joint  Weakness of left leg  Difficulty walking  Abnormality of gait      Subjective Assessment - 08/10/15 1055    Subjective Patient looking forward to being able to do more exercises. Reports had do alot of walking yesterday when went with wife to hospital for her to have a procedure.   Currently in Pain? Yes   Pain Score 3    Pain Location Knee   Pain Orientation Left            [facility] PT Assessment - 08/10/15 1052    ROM / Strength   AROM / PROM / Strength AROM;PROM   AROM   AROM Assessment Site Knee   Right/Left Knee Left   Left Knee Extension 14   Left Knee Flexion 100    PROM   PROM Assessment Site Knee   Right/Left Knee Left   Left Knee Extension 8   Left Knee Flexion 105                     [facility] Adult PT Treatment/Exercise - 08/10/15 1052    Exercises   Exercises Knee/Hip   Knee/Hip Exercises: Stretches   Gastroc Stretch 20 seconds;3 reps   Gastroc Stretch Limitations Combined gastroc/hamstring stretch with strap + active quad set in sitting   Knee/Hip Exercises: Aerobic   Nustep lvl 4 x5'   Knee/Hip Exercises: Standing   Hip Flexion Both;10 reps;Knee bent   Hip Flexion Limitations Marching on Blue foam with RW support   Terminal Knee Extension Strengthening;Left;10 reps;1 set   Terminal Knee Extension Limitations 5" hold with small ball behind knee   Hip Abduction Both;10 reps;Knee straight   Abduction Limitations Alternating on Blue foam with RW support   Hip Extension Both;10 reps;Knee straight   Extension Limitations Alternating on Blue foam with RW    Lateral Step Up Left;10 reps;Hand Hold: 2;Step Height: 6"   Lateral Step Up Limitations RW for support   Forward Step Up Left;10 reps;Hand Hold: 2;Step  Height: 6"   Forward Step Up Limitations RW for support   Knee/Hip Exercises: Seated   Hamstring Curl Strengthening;10 reps   Hamstring Limitations Green TB   Knee/Hip Exercises: Supine   Short Arc Quad Sets Strengthening;Left;10 reps;2 sets  2 sets   Short Arc Quad Sets Limitations 2#   Heel Slides AAROM;Both;10 reps   Heel Slides Limitations Feet on peanut ball, 3" hold   Straight Leg Raises Strengthening;Left;10 reps;1 set   Straight Leg Raises Limitations 2#   Other Supine Knee/Hip Exercises Hip ext/quad isometric 10x3" into 8" foam roll   Manual Therapy   Manual Therapy Joint mobilization   Joint Mobilization Patellar mobs - sup/inf, med/lat                     PT Long Term Goals - 08/10/15 1141    PT LONG TERM GOAL #1   Title Independent with HEP (09/17/15)   Status On-going   PT LONG TERM GOAL  #2   Title Left knee ROM 3-120 or greater to allow normal motion with gait and stair climbing (09/17/15)   Status On-going   PT LONG TERM GOAL #3   Title Left knee strength 4/5 or greater for improved stability (09/17/15)   Status On-going   PT LONG TERM GOAL #4   Title Ambulate on all surfaces without cane (or cane used only on left in conjuction with right AFO) with normal gait pattern  (09/17/15)   Status On-going               Plan - 08/10/15 1137    Clinical Impression Statement Patient excited about improving function and strength in left knee. Tolerating all exercises well with mild fatigue noted. ROM improving in both flexion and extension. Patient declined ice pack/vasopnuematic; patient instructed to ice when he gets home.   PT Next Visit Plan TKR protocol - ROM, strengthening, modalities PRN   Consulted and Agree with Plan of Care Patient        Problem List Patient Active Problem List   Diagnosis Date Noted  . Total knee replacement status 07/13/2015    Percival Spanish, PT, MPT 08/10/2015, 11:52 AM  [facility] [address]  [place], [place], [address] Phone: [phone]   Fax:  [phone]

## 2015-08-15 ENCOUNTER — Ambulatory Visit: Payer: 59 | Admitting: Physical Therapy

## 2015-08-17 ENCOUNTER — Ambulatory Visit: Payer: 59 | Attending: Orthopaedic Surgery | Admitting: Physical Therapy

## 2015-08-17 DIAGNOSIS — R262 Difficulty in walking, not elsewhere classified: Secondary | ICD-10-CM

## 2015-08-17 DIAGNOSIS — M25662 Stiffness of left knee, not elsewhere classified: Secondary | ICD-10-CM

## 2015-08-17 DIAGNOSIS — M25462 Effusion, left knee: Secondary | ICD-10-CM | POA: Diagnosis present

## 2015-08-17 DIAGNOSIS — R29898 Other symptoms and signs involving the musculoskeletal system: Secondary | ICD-10-CM | POA: Diagnosis present

## 2015-08-17 DIAGNOSIS — R269 Unspecified abnormalities of gait and mobility: Secondary | ICD-10-CM | POA: Diagnosis present

## 2015-08-17 DIAGNOSIS — M25562 Pain in left knee: Secondary | ICD-10-CM

## 2015-08-17 NOTE — Therapy (Signed)
[facility] [address]  [place], [place], [address] Phone: [phone]   Fax:  [phone]  Physical Therapy Treatment  Patient Details  Name: [name] MRN: [id number] Date of Birth: June 30, 1961 Referring Provider:  Marybelle Killings, MD  Encounter Date: 08/17/2015      PT End of Session - 08/17/15 1028    Visit Number 3   Number of Visits 12   Date for PT Re-Evaluation 09/17/15   PT Start Time 0931   PT Stop Time 1016   PT Time Calculation (min) 45 min   Activity Tolerance Patient tolerated treatment well   Behavior During Therapy [facility] for tasks assessed/performed      Past Medical History  Diagnosis Date  . Hypertension   . Diabetes mellitus without complication     Past Surgical History  Procedure Laterality Date  . Knee arthroscopy Left 96  . Cholecystectomy  10  . Back surgery  09  . Eye surgery Bilateral     strabismus   54 yrs old  . Knee arthroplasty Left 07/13/2015    Procedure: COMPUTER ASSISTED TOTAL KNEE ARTHROPLASTY LEFT, REMOVE ANTERIOR CRUCIATE LIGAMENT SCREWS AND STAPLES;  Surgeon: Marybelle Killings, MD;  Location: [facility];  Service: Orthopedics;  Laterality: Left;    There were no vitals filed for this visit.  Visit Diagnosis:  Stiffness of knee joint, left  Left knee pain  Swelling of left knee joint  Weakness of left leg  Difficulty walking  Abnormality of gait      Subjective Assessment - 08/17/15 0937    Subjective Patient reporting more stiffness than pain. Has been doing alot of walking lately.   Currently in Pain? Yes   Pain Score --  2-3/10, more pain than stiffness   Pain Location Knee   Pain Orientation Left                     [facility] Adult PT Treatment/Exercise - 08/17/15 0934    Exercises   Exercises Knee/Hip   Knee/Hip Exercises: Aerobic   Nustep lvl 5 x5'   Knee/Hip Exercises: Standing   Heel Raises Both;20 reps;3 seconds   Heel Raises  Limitations on back on UBE with negative heel   Hip Flexion Both;10 reps;Knee bent   Hip Flexion Limitations Marching on Blue foam with 2 pole A   Terminal Knee Extension Strengthening;Left;15 reps   Terminal Knee Extension Limitations 5" hold with small ball behind knee   Lateral Step Up Left;15 reps;Hand Hold: 2;Step Height: 6"   Lateral Step Up Limitations RW for support   Forward Step Up Left;15 reps;Hand Hold: 2;Step Height: 6"   Forward Step Up Limitations RW for support   Functional Squat 10 reps;3 seconds   Functional Squat Limitations TRX   Knee/Hip Exercises: Supine   Short Arc Quad Sets Strengthening;Left;15 reps;2 sets  2 sets   Short Arc Quad Sets Limitations 3# with 3" hold   Heel Slides AAROM;Both;10 reps;2 sets   Heel Slides Limitations Feet on peanut ball, 3" hold   Straight Leg Raises Strengthening;Left;15 reps;1 set   Straight Leg Raises Limitations 3# with 3" hold   Other Supine Knee/Hip Exercises Hip ext/quad isometric 10x3" into peanut bal   Manual Therapy   Manual Therapy Joint mobilization;Soft tissue mobilization   Joint Mobilization Patellar mobs - sup/inf, med/lat   Soft tissue mobilization Incicisional scar massage  PT Long Term Goals - 08/17/15 1045    PT LONG TERM GOAL #1   Title Independent with HEP (09/17/15)   Status On-going   PT LONG TERM GOAL #2   Title Left knee ROM 3-120 or greater to allow normal motion with gait and stair climbing (09/17/15)   Status On-going   PT LONG TERM GOAL #3   Title Left knee strength 4/5 or greater for improved stability (09/17/15)   Status On-going   PT LONG TERM GOAL #4   Title Ambulate on all surfaces without cane (or cane used only on left in conjuction with right AFO) with normal gait pattern  (09/17/15)   Status On-going               Plan - 08/17/15 1030    Clinical Impression Statement Increased edema/swelling in knee and lower legs, most likely secondary to  increased walking reported by patient. Patient inquiring about weaning off SBQC, but given instability from right foot and decreased balance on uneven surfaces instructed patient to continue to use cane at present.   PT Next Visit Plan TKR protocol - ROM, strengthening, modalities PRN   Consulted and Agree with Plan of Care Patient        Problem List Patient Active Problem List   Diagnosis Date Noted  . Total knee replacement status 07/13/2015    Percival Spanish, PT, MPT 08/17/2015, 10:58 AM  [facility] [address]  [place], [place], [address] Phone: [phone]   Fax:  [phone]

## 2015-08-21 ENCOUNTER — Ambulatory Visit: Payer: 59 | Admitting: Rehabilitation

## 2015-08-21 DIAGNOSIS — M25662 Stiffness of left knee, not elsewhere classified: Secondary | ICD-10-CM | POA: Diagnosis not present

## 2015-08-21 DIAGNOSIS — R29898 Other symptoms and signs involving the musculoskeletal system: Secondary | ICD-10-CM

## 2015-08-21 DIAGNOSIS — R269 Unspecified abnormalities of gait and mobility: Secondary | ICD-10-CM

## 2015-08-21 DIAGNOSIS — M25462 Effusion, left knee: Secondary | ICD-10-CM

## 2015-08-21 DIAGNOSIS — R262 Difficulty in walking, not elsewhere classified: Secondary | ICD-10-CM

## 2015-08-21 DIAGNOSIS — M25562 Pain in left knee: Secondary | ICD-10-CM

## 2015-08-21 NOTE — Therapy (Signed)
[facility] [address]  [place], [place], [address] Phone: [phone]   Fax:  [phone]  Physical Therapy Treatment  Patient Details  Name: [name] MRN: [id number] Date of Birth: 1961-10-08 Referring Provider:  Marybelle Killings, MD  Encounter Date: 08/21/2015      PT End of Session - 08/21/15 1317    Visit Number 4   Number of Visits 12   Date for PT Re-Evaluation 09/17/15   PT Start Time 3646   PT Stop Time 8032   PT Time Calculation (min) 40 min      Past Medical History  Diagnosis Date  . Hypertension   . Diabetes mellitus without complication     Past Surgical History  Procedure Laterality Date  . Knee arthroscopy Left 96  . Cholecystectomy  10  . Back surgery  09  . Eye surgery Bilateral     strabismus   54 yrs old  . Knee arthroplasty Left 07/13/2015    Procedure: COMPUTER ASSISTED TOTAL KNEE ARTHROPLASTY LEFT, REMOVE ANTERIOR CRUCIATE LIGAMENT SCREWS AND STAPLES;  Surgeon: Marybelle Killings, MD;  Location: [facility];  Service: Orthopedics;  Laterality: Left;    There were no vitals filed for this visit.  Visit Diagnosis:  Stiffness of knee joint, left  Left knee pain  Swelling of left knee joint  Weakness of left leg  Difficulty walking  Abnormality of gait      Subjective Assessment - 08/21/15 1317    Subjective Reports knee is just a little achey but no pain.    Currently in Pain? No/denies            [facility] PT Assessment - 08/21/15 1330    ROM / Strength   AROM / PROM / Strength AROM   AROM   AROM Assessment Site Knee   Right/Left Knee Left   Left Knee Extension 10   Left Knee Flexion 100                     [facility] Adult PT Treatment/Exercise - 08/21/15 1319    Exercises   Exercises Knee/Hip   Knee/Hip Exercises: Aerobic   Nustep lvl 5 x5'   Knee/Hip Exercises: Standing   Heel Raises Both;20 reps;3 seconds   Heel Raises Limitations on back on UBE with  negative heel   Knee Flexion AROM;Left;10 reps   Knee Flexion Limitations 3" hold with foot on low row seat at lowest level   Hip Flexion Both;10 reps;Knee bent   Hip Flexion Limitations Marching on Blue foam with 1 HHA on counter   Terminal Knee Extension Strengthening;Left;15 reps   Terminal Knee Extension Limitations 5" hold with small ball behind knee   Hip Abduction Both;10 reps;Knee straight   Abduction Limitations Alternating on Blue foam   HHA on counter   Lateral Step Up Left;15 reps;Hand Hold: 2;Step Height: 6"   Lateral Step Up Limitations RW for support   Forward Step Up Left;15 reps;Hand Hold: 2;Step Height: 6"   Forward Step Up Limitations RW for support   Functional Squat 10 reps;3 seconds   Functional Squat Limitations TRX   Knee/Hip Exercises: Seated   Long Arc Quad Left;10 reps;2 sets   [employer] Weight 4 lbs.   Knee/Hip Exercises: Supine   Short Arc Quad Sets Strengthening;Left;10 reps  2 sets   Short Arc Quad Sets Limitations 4# with 5" hold   Heel Slides AAROM;Both;15 reps  Heel Slides Limitations Feet on peanut ball, 3" hold   Other Supine Knee/Hip Exercises Hip ext/quad isometric 12x3" into peanut bal                     PT Long Term Goals - 08/17/15 1045    PT LONG TERM GOAL #1   Title Independent with HEP (09/17/15)   Status On-going   PT LONG TERM GOAL #2   Title Left knee ROM 3-120 or greater to allow normal motion with gait and stair climbing (09/17/15)   Status On-going   PT LONG TERM GOAL #3   Title Left knee strength 4/5 or greater for improved stability (09/17/15)   Status On-going   PT LONG TERM GOAL #4   Title Ambulate on all surfaces without cane (or cane used only on left in conjuction with right AFO) with normal gait pattern  (09/17/15)   Status On-going               Plan - 08/21/15 1356    Clinical Impression Statement Good tolerance to all exercises without limitation of pain. Minimal improvement in flexion ROM  but extension seems to be coming along well.    PT Next Visit Plan TKR protocol - ROM, strengthening, modalities PRN   Consulted and Agree with Plan of Care Patient        Problem List Patient Active Problem List   Diagnosis Date Noted  . Total knee replacement status 07/13/2015    Barbette Hair, PTA 08/21/2015, 1:57 PM  [facility] [address]  [place], [place], [address] Phone: [phone]   Fax:  [phone]

## 2015-08-23 ENCOUNTER — Ambulatory Visit: Payer: 59 | Admitting: Physical Therapy

## 2015-08-23 DIAGNOSIS — R262 Difficulty in walking, not elsewhere classified: Secondary | ICD-10-CM

## 2015-08-23 DIAGNOSIS — M25662 Stiffness of left knee, not elsewhere classified: Secondary | ICD-10-CM | POA: Diagnosis not present

## 2015-08-23 DIAGNOSIS — R269 Unspecified abnormalities of gait and mobility: Secondary | ICD-10-CM

## 2015-08-23 DIAGNOSIS — M25462 Effusion, left knee: Secondary | ICD-10-CM

## 2015-08-23 DIAGNOSIS — R29898 Other symptoms and signs involving the musculoskeletal system: Secondary | ICD-10-CM

## 2015-08-23 DIAGNOSIS — M25562 Pain in left knee: Secondary | ICD-10-CM

## 2015-08-23 NOTE — Therapy (Signed)
[facility] [address]  [place], [place], [address] Phone: [phone]   Fax:  [phone]  Physical Therapy Treatment  Patient Details  Name: [name] MRN: [id number] Date of Birth: June 07, 1961 Referring Provider:  Marybelle Killings, MD  Encounter Date: 08/23/2015      PT End of Session - 08/23/15 1024    Visit Number 5   Number of Visits 12   Date for PT Re-Evaluation 09/17/15   PT Start Time 1017   PT Stop Time 1128   PT Time Calculation (min) 71 min   Activity Tolerance Patient tolerated treatment well   Behavior During Therapy [facility] for tasks assessed/performed      Past Medical History  Diagnosis Date  . Hypertension   . Diabetes mellitus without complication     Past Surgical History  Procedure Laterality Date  . Knee arthroscopy Left 96  . Cholecystectomy  10  . Back surgery  09  . Eye surgery Bilateral     strabismus   54 yrs old  . Knee arthroplasty Left 07/13/2015    Procedure: COMPUTER ASSISTED TOTAL KNEE ARTHROPLASTY LEFT, REMOVE ANTERIOR CRUCIATE LIGAMENT SCREWS AND STAPLES;  Surgeon: Marybelle Killings, MD;  Location: [facility];  Service: Orthopedics;  Laterality: Left;    There were no vitals filed for this visit.  Visit Diagnosis:  Stiffness of knee joint, left  Left knee pain  Swelling of left knee joint  Weakness of left leg  Difficulty walking  Abnormality of gait      Subjective Assessment - 08/23/15 1022    Subjective Patient states pleased with the decrease in pain and improvement in function. States stiffness is the biggest problem and pain comes when trying to loosen it up.   Currently in Pain? Yes   Pain Score --  2.5-3/10   Pain Location Knee   Pain Orientation Left            [facility] PT Assessment - 08/23/15 1017    ROM / Strength   AROM / PROM / Strength AROM;PROM;Strength   AROM   AROM Assessment Site Knee   Right/Left Knee Left   Left Knee Extension 12   Left  Knee Flexion 101   PROM   PROM Assessment Site Knee   Right/Left Knee Left   Left Knee Extension 9   Left Knee Flexion 107   Strength   Strength Assessment Site Knee;Hip   Right/Left Hip Left   Left Hip Flexion 4/5   Left Hip Extension 4-/5   Left Hip ABduction 4-/5   Left Hip ADduction 4-/5   Right/Left Knee Left   Left Knee Flexion 4-/5   Left Knee Extension 4-/5                [facility] Adult PT Treatment/Exercise - 08/23/15 1017    Ambulation/Gait   Assistive device Small based quad cane   Gait Pattern Step-through pattern;Decreased step length - left;Decreased step length - right;Decreased weight shift to left   Gait Comments Worked on increasing step length with emphasis on symmetrical step length and good heel strike bilaterally   Exercises   Exercises Knee/Hip   Knee/Hip Exercises: Aerobic   Nustep lvl 5 x5'   Knee/Hip Exercises: Standing   Knee Flexion AROM;Left;10 reps   Knee Flexion Limitations 5" hold with foot on low row seat at lowest level   Lateral Step Up Left;20 reps;Hand Hold: 2;Step Height: 6"  Lateral Step Up Limitations RW for support   Forward Step Up Left;20 reps;Hand Hold: 2;Step Height: 6"   Forward Step Up Limitations RW for support   Functional Squat 10 reps;2 sets;3 seconds   Functional Squat Limitations TRX for first set, holding to edge of sink for 2nd set (add to HEP)   Knee/Hip Exercises: Seated   Other Seated Knee/Hip Exercises Fitter Leg Press (1 black/1 blue) 2x10   Knee/Hip Exercises: Supine   Short Arc Quad Sets Strengthening;Left;10 reps  2 sets   Short Arc Quad Sets Limitations 4# with 5"   Heel Slides AAROM;Both;15 reps   Heel Slides Limitations Feet on peanut ball, 5" hold   Straight Leg Raises Strengthening;Left;15 reps;1 set   Straight Leg Raises Limitations 4# with 3" hold   Other Supine Knee/Hip Exercises Hip ext/quad isometric 12x3" into peanut bal   Manual Therapy   Manual Therapy Joint mobilization;Soft tissue  mobilization   Joint Mobilization Patellar mobs - sup/inf, med/lat; Grade I-II extension mobs   Soft tissue mobilization Incisional scar massage                PT Long Term Goals - 08/23/15 1106    PT LONG TERM GOAL #1   Title Independent with HEP (09/17/15)   Status On-going   PT LONG TERM GOAL #2   Title Left knee ROM 3-120 or greater to allow normal motion with gait and stair climbing (09/17/15)   Status On-going   PT LONG TERM GOAL #3   Title Left knee strength 4/5 or greater for improved stability (09/17/15)   Status On-going   PT LONG TERM GOAL #4   Title Ambulate on all surfaces without cane (or cane used only on left in conjuction with right AFO) with normal gait pattern  (09/17/15)   Status On-going               Plan - 08/23/15 1123    Clinical Impression Statement Left knee ROM improving (AROM 12-101, PROM 9-107) but remains limited by swelling/edema. Patient may benefit from compression stocking to aide in edema reduction. Left hip/knee strength improving with all grossly 4-/5. Continues to demonstrate decreased step length on ipsilateral side to [facility] whether cane carried on left or right, therefore gait training continues to emphasize increased symmetrical stride length with improved heel strike.   PT Next Visit Plan TKR protocol - ROM, strengthening, modalities PRN   Consulted and Agree with Plan of Care Patient        Problem List Patient Active Problem List   Diagnosis Date Noted  . Total knee replacement status 07/13/2015    Percival Spanish, PT, MPT 08/23/2015, 11:46 AM  [facility] [address]  [place], [place], [address] Phone: [phone]   Fax:  [phone]

## 2015-08-27 ENCOUNTER — Ambulatory Visit: Payer: 59 | Admitting: Rehabilitation

## 2015-08-28 ENCOUNTER — Ambulatory Visit: Payer: 59 | Admitting: Physical Therapy

## 2015-08-28 DIAGNOSIS — R262 Difficulty in walking, not elsewhere classified: Secondary | ICD-10-CM

## 2015-08-28 DIAGNOSIS — M25662 Stiffness of left knee, not elsewhere classified: Secondary | ICD-10-CM

## 2015-08-28 DIAGNOSIS — M25462 Effusion, left knee: Secondary | ICD-10-CM

## 2015-08-28 DIAGNOSIS — M25562 Pain in left knee: Secondary | ICD-10-CM

## 2015-08-28 DIAGNOSIS — R269 Unspecified abnormalities of gait and mobility: Secondary | ICD-10-CM

## 2015-08-28 DIAGNOSIS — R29898 Other symptoms and signs involving the musculoskeletal system: Secondary | ICD-10-CM

## 2015-08-28 NOTE — Therapy (Signed)
[facility] [address]  [place], [place], [address] Phone: [phone]   Fax:  [phone]  Physical Therapy Treatment  Patient Details  Name: [name] MRN: [id number] Date of Birth: [birth year] Referring Provider:  Marybelle Killings, MD  Encounter Date: 08/28/2015      PT End of Session - 08/28/15 0856    Visit Number 6   Number of Visits 12   Date for PT Re-Evaluation 09/17/15   PT Start Time 0848   PT Stop Time 0928   PT Time Calculation (min) 40 min   Activity Tolerance Patient tolerated treatment well   Behavior During Therapy [facility] for tasks assessed/performed      Past Medical History  Diagnosis Date  . Hypertension   . Diabetes mellitus without complication     Past Surgical History  Procedure Laterality Date  . Knee arthroscopy Left 55  . Cholecystectomy  55  . Back surgery  55  . Eye surgery Bilateral     strabismus   54 yrs old  . Knee arthroplasty Left 55/29/2016    Procedure: COMPUTER ASSISTED TOTAL KNEE ARTHROPLASTY LEFT, REMOVE ANTERIOR CRUCIATE LIGAMENT SCREWS AND STAPLES;  Surgeon: Marybelle Killings, MD;  Location: [facility];  Service: Orthopedics;  Laterality: Left;    There were no vitals filed for this visit.  Visit Diagnosis:  Stiffness of knee joint, left  Left knee pain  Swelling of left knee joint  Weakness of left leg  Difficulty walking  Abnormality of gait      Subjective Assessment - 08/28/15 0852    Subjective Patient states MD pleased with progress but wanted patient to stop using SBQC so he has switched to a handmade wooden cane. Patient reports MD changed his meds in attempt to reduce edema.   Currently in Pain? Yes   Pain Score 2   Intermittent   Pain Location Knee   Pain Orientation Left   Pain Descriptors / Indicators Aching   Pain Frequency Intermittent            [facility] PT Assessment - 08/28/15 0848    Assessment   Next MD Visit 09/28/15                   [facility] Adult PT Treatment/Exercise - 08/28/15 0848    Ambulation/Gait   Gait Comments Worked on increasing step length with emphasis on symmetrical step length and good heel strike bilaterally and proper sequencing of SPC   Exercises   Exercises Knee/Hip   Knee/Hip Exercises: Aerobic   Recumbent Bike lvl 1 x5'   Knee/Hip Exercises: Machines for Strengthening   Cybex Knee Extension DL 25# x10, SL 10# x10   Cybex Knee Flexion DL 35# x10, SL 15# x10   Knee/Hip Exercises: Standing   Lateral Step Up Left;20 reps;Hand Hold: 2;Step Height: 6"   Lateral Step Up Limitations 2 pole A   Forward Step Up Left;20 reps;Hand Hold: 2;Step Height: 6"   Forward Step Up Limitations 2 pole A   Step Down Left;10 reps;Hand Hold: 2;Step Height: 4"   Step Down Limitations 2 pole A   Functional Squat 10 reps;2 sets;3 seconds   Functional Squat Limitations TRX   Other Standing Knee Exercises TM hamstring pull x10   Knee/Hip Exercises: Seated   Other Seated Knee/Hip Exercises Fitter Leg Press (1 black/1 blue) 2x10  PT Long Term Goals - 08/23/15 1106    PT LONG TERM GOAL #1   Title Independent with HEP (09/17/15)   Status On-going   PT LONG TERM GOAL #2   Title Left knee ROM 3-120 or greater to allow normal motion with gait and stair climbing (09/17/15)   Status On-going   PT LONG TERM GOAL #3   Title Left knee strength 4/5 or greater for improved stability (09/17/15)   Status On-going   PT LONG TERM GOAL #4   Title Ambulate on all surfaces without cane (or cane used only on left in conjuction with right AFO) with normal gait pattern  (09/17/15)   Status On-going               Plan - 08/28/15 1004    Clinical Impression Statement Patient's handmade SPC slighty too short and only has a felt tip on end. Recommended adding a rubber tip to reduce slipping hazard and add height to cane to allow for improved posture and sequencing during gait.  Provided gait training in proper sequencing and step pattern with SPC emphaiszing increased step length and good heel strike bilaterally. Tolerating decreasing UE support during standing exercises with no loss of balance.   PT Next Visit Plan TKR protocol - ROM, strengthening, modalities PRN   Consulted and Agree with Plan of Care Patient        Problem List Patient Active Problem List   Diagnosis Date Noted  . Total knee replacement status 07/13/2015    Percival Spanish, PT, MPT 08/28/2015, 10:31 AM  [facility] [place], [place], [address] Phone: [phone]   Fax:  [phone]

## 2015-08-29 ENCOUNTER — Ambulatory Visit: Payer: 59 | Admitting: Physical Therapy

## 2015-08-29 DIAGNOSIS — R29898 Other symptoms and signs involving the musculoskeletal system: Secondary | ICD-10-CM

## 2015-08-29 DIAGNOSIS — R269 Unspecified abnormalities of gait and mobility: Secondary | ICD-10-CM

## 2015-08-29 DIAGNOSIS — M25462 Effusion, left knee: Secondary | ICD-10-CM

## 2015-08-29 DIAGNOSIS — M25562 Pain in left knee: Secondary | ICD-10-CM

## 2015-08-29 DIAGNOSIS — M25662 Stiffness of left knee, not elsewhere classified: Secondary | ICD-10-CM | POA: Diagnosis not present

## 2015-08-29 DIAGNOSIS — R262 Difficulty in walking, not elsewhere classified: Secondary | ICD-10-CM

## 2015-08-29 NOTE — Therapy (Signed)
[facility] [address]  [place], [place], [address] Phone: [phone]   Fax:  [phone]  Physical Therapy Treatment  Patient Details  Name: [name] MRN: [id number] Date of Birth: July 25, 1961 Referring Provider:  Marybelle Killings, MD  Encounter Date: 08/29/2015      PT End of Session - 08/29/15 1237    Visit Number 7   Number of Visits 12   Date for PT Re-Evaluation 09/17/15   PT Start Time (p) 1150   PT Stop Time (p) 1236   PT Time Calculation (min) (p) 46 min   Activity Tolerance (p) Patient tolerated treatment well   Behavior During Therapy (p) W[facility] for tasks assessed/performed      Past Medical History  Diagnosis Date  . Hypertension   . Diabetes mellitus without complication     Past Surgical History  Procedure Laterality Date  . Knee arthroscopy Left 96  . Cholecystectomy  10  . Back surgery  09  . Eye surgery Bilateral     strabismus   54 yrs old  . Knee arthroplasty Left 07/13/2015    Procedure: COMPUTER ASSISTED TOTAL KNEE ARTHROPLASTY LEFT, REMOVE ANTERIOR CRUCIATE LIGAMENT SCREWS AND STAPLES;  Surgeon: Marybelle Killings, MD;  Location: [facility];  Service: Orthopedics;  Laterality: Left;    There were no vitals filed for this visit.  Visit Diagnosis:  Stiffness of knee joint, left  Left knee pain  Swelling of left knee joint  Weakness of left leg  Difficulty walking  Abnormality of gait      Subjective Assessment - 08/29/15 1153    Subjective Patient reports stiffness and occasional ache, nut denies pain.   Currently in Pain? No/denies            [facility] PT Assessment - 08/29/15 1148    ROM / Strength   AROM / PROM / Strength AROM   AROM   AROM Assessment Site Knee   Right/Left Knee Left   Left Knee Extension 9   Left Knee Flexion 105   PROM   PROM Assessment Site Knee   Right/Left Knee Left   Left Knee Extension 8   Left Knee Flexion 108                      [facility] Adult PT Treatment/Exercise - 08/29/15 1148    Ambulation/Gait   Stairs Assistance 6: Modified independent (Device/Increase time)   Stair Management Technique One rail Left;Alternating pattern;With cane   Number of Stairs 12  x2   Height of Stairs 7   Gait Comments Worked on increasing step length with emphasis on symmetrical step length and good heel strike bilaterally and proper sequencing of SPC   Exercises   Exercises Knee/Hip   Knee/Hip Exercises: Aerobic   Recumbent Bike lvl 2 x5'   Knee/Hip Exercises: Machines for Strengthening   Cybex Knee Extension DL 25# x15, SL 10# x10   Cybex Knee Flexion DL 35# x15, SL 15# x10   Knee/Hip Exercises: Standing   Lateral Step Up Left;20 reps;Hand Hold: 2   Lateral Step Up Limitations BOSU, 3" hold, HHA on counter   Forward Step Up Left;20 reps   Forward Step Up Limitations BOSU, 3" hold, HHA on counter   Step Down Left;10 reps;Hand Hold: 2;Step Height: 6"   Step Down Limitations 2 pole A   Functional Squat 10 reps;2 sets;3 seconds   Functional Squat Limitations TRX  Other Standing Knee Exercises TM hamstring pull x20   Other Standing Knee Exercises TM TKE x10   Knee/Hip Exercises: Seated   Other Seated Knee/Hip Exercises Fitter Leg Press (1 black/1 blue) 2x10   Manual Therapy   Manual Therapy Joint mobilization;Soft tissue mobilization   Joint Mobilization Grade I-II extension mobs   Soft tissue mobilization Incisional scar massage                     PT Long Term Goals - 08/29/15 1323    PT LONG TERM GOAL #1   Title Independent with HEP (09/17/15)   Status On-going   PT LONG TERM GOAL #2   Title Left knee ROM 3-120 or greater to allow normal motion with gait and stair climbing (09/17/15)   Status On-going   PT LONG TERM GOAL #3   Status On-going   PT LONG TERM GOAL #4   Title Ambulate on all surfaces without cane (or cane used only on left in conjuction with right AFO) with normal gait pattern  (09/17/15)    Status On-going               Plan - 08/29/15 1315    Clinical Impression Statement Cane height improved with addition of rubber tip but patient still requring cues for proper sequencing during gait and stair ascent/descent, along with reminders for increased step length and good heel strike for improved foot clearance while ambulating. Functional strength improving with AROM nearing PROM in left knee, but ROM still limited secondary to persistent edema. Reminded patient to elevate LE's as much as possible, especially while sitting around at hospital tomorrow while wife undergoes surgery.   PT Next Visit Plan TKR protocol - ROM, strengthening, modalities PRN   Consulted and Agree with Plan of Care Patient        Problem List Patient Active Problem List   Diagnosis Date Noted  . Total knee replacement status 07/13/2015    Percival Spanish, PT, MPT 08/29/2015, 1:25 PM  [facility] [address]  [place], [place], [address] Phone: [phone]   Fax:  [phone]

## 2015-08-30 ENCOUNTER — Ambulatory Visit: Payer: 59 | Admitting: Physical Therapy

## 2015-09-04 ENCOUNTER — Ambulatory Visit: Payer: 59 | Admitting: Rehabilitation

## 2015-09-04 DIAGNOSIS — M25562 Pain in left knee: Secondary | ICD-10-CM

## 2015-09-04 DIAGNOSIS — M25462 Effusion, left knee: Secondary | ICD-10-CM

## 2015-09-04 DIAGNOSIS — M25662 Stiffness of left knee, not elsewhere classified: Secondary | ICD-10-CM

## 2015-09-04 DIAGNOSIS — R29898 Other symptoms and signs involving the musculoskeletal system: Secondary | ICD-10-CM

## 2015-09-04 DIAGNOSIS — R262 Difficulty in walking, not elsewhere classified: Secondary | ICD-10-CM

## 2015-09-04 DIAGNOSIS — R269 Unspecified abnormalities of gait and mobility: Secondary | ICD-10-CM

## 2015-09-04 NOTE — Therapy (Signed)
[facility] [address]  [place], [place], [address] Phone: [phone]   Fax:  [phone]  Physical Therapy Treatment  Patient Details  Name: [name] MRN: [id number] Date of Birth: 1961/12/05 Referring Provider:  Marybelle Killings, MD  Encounter Date: 09/04/2015      PT End of Session - 09/04/15 0903    Visit Number 8   Number of Visits 12   Date for PT Re-Evaluation 09/17/15   PT Start Time 0900  pt early   PT Stop Time 0942   PT Time Calculation (min) 42 min      Past Medical History  Diagnosis Date  . Hypertension   . Diabetes mellitus without complication     Past Surgical History  Procedure Laterality Date  . Knee arthroscopy Left 54  . Cholecystectomy  55  . Back surgery  55  . Eye surgery Bilateral     strabismus   54 yrs old  . Knee arthroplasty Left 07/13/2015    Procedure: COMPUTER ASSISTED TOTAL KNEE ARTHROPLASTY LEFT, REMOVE ANTERIOR CRUCIATE LIGAMENT SCREWS AND STAPLES;  Surgeon: Marybelle Killings, MD;  Location: [facility];  Service: Orthopedics;  Laterality: Left;    There were no vitals filed for this visit.  Visit Diagnosis:  Stiffness of knee joint, left  Left knee pain  Swelling of left knee joint  Weakness of left leg  Difficulty walking  Abnormality of gait      Subjective Assessment - 09/04/15 0947    Subjective Denies pain. Has been helping his wife due to her having surgery last week.    Currently in Pain? No/denies                         [facility] Adult PT Treatment/Exercise - 09/04/15 0903    Exercises   Exercises Knee/Hip   Knee/Hip Exercises: Aerobic   Recumbent Bike lvl 2 x5'   Knee/Hip Exercises: Machines for Strengthening   Cybex Knee Extension DL 25# x15, SL 15# x15   Cybex Knee Flexion DL 35# x20, SL 20# x15   Knee/Hip Exercises: Standing   Terminal Knee Extension Strengthening;Left;20 reps   Terminal Knee Extension Limitations 5" hold with  small ball behind knee   Lateral Step Up Left;20 reps;Hand Hold: 2   Lateral Step Up Limitations BOSU, 3" hold, HHA on counter   Forward Step Up Left;20 reps   Forward Step Up Limitations BOSU, 3" hold, HHA on counter   Step Down Left;10 reps;Hand Hold: 2;Step Height: 6";2 sets   Step Down Limitations 2 pole A   Functional Squat 15 reps;2 sets;3 seconds   Functional Squat Limitations TRX   Other Standing Knee Exercises TM hamstring pull x20   Other Standing Knee Exercises TM TKE x20   Knee/Hip Exercises: Seated   Other Seated Knee/Hip Exercises Fitter Leg Press (1 black/1 blue) 2x15                     PT Long Term Goals - 08/29/15 1323    PT LONG TERM GOAL #1   Title Independent with HEP (09/17/15)   Status On-going   PT LONG TERM GOAL #2   Title Left knee ROM 3-120 or greater to allow normal motion with gait and stair climbing (09/17/15)   Status On-going   PT LONG TERM GOAL #3   Status On-going   PT LONG TERM GOAL #4  Title Ambulate on all surfaces without cane (or cane used only on left in conjuction with right AFO) with normal gait pattern  (09/17/15)   Status On-going               Plan - 09/04/15 4825    Clinical Impression Statement Good tolerance to all exercises with no complaint of pain and only 1 rest break. Still note quad lag with ambulation and focused on quad strength today.    PT Next Visit Plan TKR protocol - ROM, strengthening, modalities PRN   Consulted and Agree with Plan of Care Patient        Problem List Patient Active Problem List   Diagnosis Date Noted  . Total knee replacement status 07/13/2015    Barbette Hair, PTA 09/04/2015, 9:48 AM  [facility] [address]  [place], [place], [address] Phone: [phone]   Fax:  [phone]

## 2015-09-07 ENCOUNTER — Ambulatory Visit: Payer: 59 | Admitting: Physical Therapy

## 2015-09-07 DIAGNOSIS — R269 Unspecified abnormalities of gait and mobility: Secondary | ICD-10-CM

## 2015-09-07 DIAGNOSIS — R29898 Other symptoms and signs involving the musculoskeletal system: Secondary | ICD-10-CM

## 2015-09-07 DIAGNOSIS — R262 Difficulty in walking, not elsewhere classified: Secondary | ICD-10-CM

## 2015-09-07 DIAGNOSIS — M25562 Pain in left knee: Secondary | ICD-10-CM

## 2015-09-07 DIAGNOSIS — M25462 Effusion, left knee: Secondary | ICD-10-CM

## 2015-09-07 DIAGNOSIS — M25662 Stiffness of left knee, not elsewhere classified: Secondary | ICD-10-CM | POA: Diagnosis not present

## 2015-09-07 NOTE — Therapy (Signed)
[facility] [address]  [place], [place], [address] Phone: [phone]   Fax:  [phone]  Physical Therapy Treatment  Patient Details  Name: [name] MRN: [id number] Date of Birth: Dec 09, 1961 Referring Meira [place]:  Marybelle Killings, MD  Encounter Date: 09/07/2015      PT End of Session - 09/07/15 0943    Visit Number 9   Number of Visits 12   Date for PT Re-Evaluation 09/17/15   PT Start Time 0939  Pt arrived late   PT Stop Time 7   PT Time Calculation (min) 40 min   Activity Tolerance Patient tolerated treatment well   Behavior During Therapy [facility] for tasks assessed/performed      Past Medical History  Diagnosis Date  . Hypertension   . Diabetes mellitus without complication     Past Surgical History  Procedure Laterality Date  . Knee arthroscopy Left 96  . Cholecystectomy  10  . Back surgery  09  . Eye surgery Bilateral     strabismus   54 yrs old  . Knee arthroplasty Left 07/13/2015    Procedure: COMPUTER ASSISTED TOTAL KNEE ARTHROPLASTY LEFT, REMOVE ANTERIOR CRUCIATE LIGAMENT SCREWS AND STAPLES;  Surgeon: Marybelle Killings, MD;  Location: [facility];  Service: Orthopedics;  Laterality: Left;    There were no vitals filed for this visit.  Visit Diagnosis:  Stiffness of knee joint, left  Left knee pain  Swelling of left knee joint  Weakness of left leg  Difficulty walking  Abnormality of gait      Subjective Assessment - 09/07/15 0942    Subjective Reports knee is more stiff today after using bike at home yesterday with seat lowered to increase bend. Patient anxious to get back to work but states his company is telling him that it may be 3-6 months before he can come back as other people have returned too early after TKR and had to go out again.   Currently in Pain? Yes   Pain Score 3    Pain Location Knee   Pain Orientation Left            [facility] PT Assessment - 09/07/15 0939    ROM /  Strength   AROM / PROM / Strength AROM;Strength   AROM   AROM Assessment Site Knee   Right/Left Knee Left   Left Knee Extension 9   Left Knee Flexion 111   PROM   PROM Assessment Site Knee   Right/Left Knee Left   Left Knee Extension 6   Left Knee Flexion 114   Strength   Strength Assessment Site Knee   Right/Left Knee Left   Left Knee Flexion 4/5   Left Knee Extension 4/5                     [facility] Adult PT Treatment/Exercise - 09/07/15 0939    Ambulation/Gait   Stair Management Technique One rail Right;Two rails;Alternating pattern  1 rail ascending, 2 rails descending   Number of Stairs 12   Height of Stairs 7   Gait Comments Attempted gait without SPC with patient demonstrating decreased left foot clearance and decreased stride length on left secondary insecurity with single limb stance on right due to drop foot. Attempted to use SPC on left to compensate for right drop foot but patient unable to master proper sequencing with cane.   Exercises   Exercises Knee/Hip   Knee/Hip Exercises:  Aerobic   Recumbent Bike lvl 2 x5'   Knee/Hip Exercises: Machines for Strengthening   Cybex Knee Extension DL 35# 2x10, SL 15# x15   Cybex Knee Flexion DL 45# 2x10, SL 20# x15   Knee/Hip Exercises: Standing   Lateral Step Up Left;20 reps;Hand Hold: 2   Lateral Step Up Limitations BOSU, 3" hold, 2 pole A   Forward Step Up Left;20 reps   Forward Step Up Limitations BOSU, 3" hold, 2 pole A   Step Down Left;10 reps;Hand Hold: 2;Step Height: 6";2 sets   Step Down Limitations 2 pole A   Functional Squat 15 reps;2 sets;3 seconds  2nd set with heel raise upon standing   Functional Squat Limitations TRX   Knee/Hip Exercises: Seated   Other Seated Knee/Hip Exercises Fitter Leg Press (2 black) 2x15                     PT Long Term Goals - 09/07/15 1225    PT LONG TERM GOAL #1   Title Independent with HEP (09/17/15)   Status On-going   PT LONG TERM GOAL #2   Title  Left knee ROM 3-120 or greater to allow normal motion with gait and stair climbing (09/17/15)   Status On-going   PT LONG TERM GOAL #3   Title Left knee strength 4/5 or greater for improved stability (09/17/15)   Status Achieved   PT LONG TERM GOAL #4   Title Ambulate on all surfaces without cane (or cane used only on left in conjuction with right AFO) with normal gait pattern  (09/17/15)   Status On-going               Plan - 09/07/15 1020    Clinical Impression Statement Left knee flexion ROM continues to improve but slower progress noted with extension ROM. Left knee strength now grossly 4/5. Attempted gait without AD device today secondary patient inquiry regarding weaning off cane. 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 Attempted to switch SPC to left hand to compensate for right ankle deficits, but patient unable to demonstrate proper sequencing with cane.   PT Next Visit Plan TKR protocol - ROM, strengthening, modalities PRN, gait training (weaning SPC or swtiching to left hand); FOTO - 10   Consulted and Agree with Plan of Care Patient        Problem List Patient Active Problem List   Diagnosis Date Noted  . Total knee replacement status 07/13/2015    Percival Spanish, PT, MPT 09/07/2015, 12:26 PM  [facility] [address]  [place], [place], [address] Phone: [phone]   Fax:  [phone]

## 2015-09-11 ENCOUNTER — Ambulatory Visit: Payer: 59 | Admitting: Rehabilitation

## 2015-09-11 DIAGNOSIS — M25462 Effusion, left knee: Secondary | ICD-10-CM

## 2015-09-11 DIAGNOSIS — M25562 Pain in left knee: Secondary | ICD-10-CM

## 2015-09-11 DIAGNOSIS — R262 Difficulty in walking, not elsewhere classified: Secondary | ICD-10-CM

## 2015-09-11 DIAGNOSIS — M25662 Stiffness of left knee, not elsewhere classified: Secondary | ICD-10-CM | POA: Diagnosis not present

## 2015-09-11 DIAGNOSIS — R269 Unspecified abnormalities of gait and mobility: Secondary | ICD-10-CM

## 2015-09-11 DIAGNOSIS — R29898 Other symptoms and signs involving the musculoskeletal system: Secondary | ICD-10-CM

## 2015-09-11 NOTE — Therapy (Signed)
[facility] [address]  [place], [place], [address] Phone: [phone]   Fax:  [phone]  Physical Therapy Treatment  Patient Details  Name: [name] MRN: [id number] Date of Birth: Nov 17, 1961 Referring Provider:  Marybelle Killings, MD  Encounter Date: 09/11/2015      PT End of Session - 09/11/15 0925    Visit Number 10   Number of Visits 12   Date for PT Re-Evaluation 09/17/15   PT Start Time 0925   PT Stop Time 1008   PT Time Calculation (min) 43 min   Activity Tolerance Patient tolerated treatment well   Behavior During Therapy [facility] for tasks assessed/performed      Past Medical History  Diagnosis Date  . Hypertension   . Diabetes mellitus without complication     Past Surgical History  Procedure Laterality Date  . Knee arthroscopy Left 96  . Cholecystectomy  10  . Back surgery  09  . Eye surgery Bilateral     strabismus   54 yrs old  . Knee arthroplasty Left 07/13/2015    Procedure: COMPUTER ASSISTED TOTAL KNEE ARTHROPLASTY LEFT, REMOVE ANTERIOR CRUCIATE LIGAMENT SCREWS AND STAPLES;  Surgeon: Marybelle Killings, MD;  Location: [place];  Service: Orthopedics;  Laterality: Left;    There were no vitals filed for this visit.  Visit Diagnosis:  Stiffness of knee joint, left  Left knee pain  Swelling of left knee joint  Weakness of left leg  Difficulty walking  Abnormality of gait      Subjective Assessment - 09/11/15 1003    Subjective Denies pain but notes stiffness today, thinks its from the weather. Walked in without [facility] and ok toe clearance. Reports pt's wife has been getting onto him about picking up his foot.    Currently in Pain? No/denies            [facility] PT Assessment - 09/11/15 0925    Observation/Other Assessments   Focus on Therapeutic Outcomes (FOTO)  59% (41% limitation)                     [facility] Adult PT Treatment/Exercise - 09/11/15 0926    Ambulation/Gait    Stair Management Technique One rail Right;Alternating pattern   Number of Stairs 12   Height of Stairs 7   Exercises   Exercises Knee/Hip   Knee/Hip Exercises: Aerobic   Recumbent Bike lvl 2 x5'   Knee/Hip Exercises: Standing   Terminal Knee Extension Strengthening;Left;20 reps   Terminal Knee Extension Limitations 5" hold with small ball behind knee   Lateral Step Up Left;20 reps;Hand Hold: 2   Lateral Step Up Limitations BOSU, 3" hold, 2 pole A   Forward Step Up Left;20 reps   Forward Step Up Limitations BOSU, 3" hold, 2 pole A   Step Down Left;10 reps;Hand Hold: 2;Step Height: 6";2 sets   Step Down Limitations 2 pole A   Functional Squat 15 reps;2 sets;3 seconds   Functional Squat Limitations TRX   Other Standing Knee Exercises TM hamstring pull x20   Other Standing Knee Exercises TM TKE x20   Knee/Hip Exercises: Seated   Other Seated Knee/Hip Exercises Fitter Leg Press (2 black) 2x20                     PT Long Term Goals - 09/07/15 1225    PT LONG TERM GOAL #1   Title Independent with HEP (  09/17/15)   Status On-going   PT LONG TERM GOAL #2   Title Left knee ROM 3-120 or greater to allow normal motion with gait and stair climbing (09/17/15)   Status On-going   PT LONG TERM GOAL #3   Title Left knee strength 4/5 or greater for improved stability (09/17/15)   Status Achieved   PT LONG TERM GOAL #4   Title Ambulate on all surfaces without cane (or cane used only on left in conjuction with right AFO) with normal gait pattern  (09/17/15)   Status On-going               Plan - 09/11/15 1007    Clinical Impression Statement Able to ascend and descend stairs with reciprocal pattern and one rail assist. Ok ambulation without SPC, occationally forgets to pick up his Rt foot. Still with decreased stance time/stride length on Lt.    PT Next Visit Plan TKR protocol - ROM, strengthening, modalities PRN, gait training (weaning SPC or swtiching to left hand); FOTO -  10   Consulted and Agree with Plan of Care Patient        Problem List Patient Active Problem List   Diagnosis Date Noted  . Total knee replacement status 07/13/2015    Barbette Hair, PTA 09/11/2015, 10:09 AM  [facility] [address]  [place], [place], [address] Phone: [phone]   Fax:  [phone]

## 2015-09-13 ENCOUNTER — Ambulatory Visit: Payer: 59 | Admitting: Rehabilitation

## 2015-09-13 DIAGNOSIS — M25462 Effusion, left knee: Secondary | ICD-10-CM

## 2015-09-13 DIAGNOSIS — R29898 Other symptoms and signs involving the musculoskeletal system: Secondary | ICD-10-CM

## 2015-09-13 DIAGNOSIS — R262 Difficulty in walking, not elsewhere classified: Secondary | ICD-10-CM

## 2015-09-13 DIAGNOSIS — M25562 Pain in left knee: Secondary | ICD-10-CM

## 2015-09-13 DIAGNOSIS — M25662 Stiffness of left knee, not elsewhere classified: Secondary | ICD-10-CM

## 2015-09-13 DIAGNOSIS — R269 Unspecified abnormalities of gait and mobility: Secondary | ICD-10-CM

## 2015-09-13 NOTE — Therapy (Signed)
[facility] [address]  [place], [place], [address] Phone: [phone]   Fax:  [phone]  Physical Therapy Treatment  Patient Details  Name: [name] MRN: [id number] Date of Birth: [birth year] Referring Clatie Kessen:  Marybelle Killings, MD  Encounter Date: 09/13/2015      PT End of Session - 09/13/15 1356    Visit Number 11   Number of Visits 12   Date for PT Re-Evaluation 09/17/15   PT Start Time 9509   PT Stop Time 1443   PT Time Calculation (min) 47 min      Past Medical History  Diagnosis Date  . Hypertension   . Diabetes mellitus without complication     Past Surgical History  Procedure Laterality Date  . Knee arthroscopy Left 96  . Cholecystectomy  10  . Back surgery  09  . Eye surgery Bilateral     strabismus   54 yrs old  . Knee arthroplasty Left 07/13/2015    Procedure: COMPUTER ASSISTED TOTAL KNEE ARTHROPLASTY LEFT, REMOVE ANTERIOR CRUCIATE LIGAMENT SCREWS AND STAPLES;  Surgeon: Marybelle Killings, MD;  Location: [place];  Service: Orthopedics;  Laterality: Left;    There were no vitals filed for this visit.  Visit Diagnosis:  Stiffness of knee joint, left  Left knee pain  Swelling of left knee joint  Weakness of left leg  Difficulty walking  Abnormality of gait          [facility] PT Assessment - 09/13/15 1436    AROM   AROM Assessment Site Knee   Right/Left Knee Left   Left Knee Extension 8   Left Knee Flexion 110   Strength   Right/Left Knee Left   Left Knee Flexion 4/5   Left Knee Extension 4+/5                     [facility] Adult PT Treatment/Exercise - 09/13/15 1403    Exercises   Exercises Knee/Hip   Knee/Hip Exercises: Aerobic   Recumbent Bike lvl 2 x6'   Knee/Hip Exercises: Machines for Strengthening   Cybex Knee Extension DL 35# 2x12, SL 20# x10   Cybex Knee Flexion DL 45# 2x12, SL 25# 2x10   Knee/Hip Exercises: Standing   Terminal Knee Extension  Strengthening;Left;20 reps   Terminal Knee Extension Limitations 5" hold with small ball behind knee   Lateral Step Up Left;20 reps;Hand Hold: 2   Lateral Step Up Limitations BOSU, 3" hold, 2 pole A   Forward Step Up Left;20 reps   Forward Step Up Limitations BOSU, 3" hold, 2 pole A   Step Down Left;Hand Hold: 2;Step Height: 6";2 sets  12 reps   Step Down Limitations 2 pole A   Functional Squat 15 reps;2 sets;3 seconds   Functional Squat Limitations TRX   Other Standing Knee Exercises TM hamstring pull x20   Other Standing Knee Exercises TM TKE x20                     PT Long Term Goals - 09/07/15 1225    PT LONG TERM GOAL #1   Title Independent with HEP (09/17/15)   Status On-going   PT LONG TERM GOAL #2   Title Left knee ROM 3-120 or greater to allow normal motion with gait and stair climbing (09/17/15)   Status On-going   PT LONG TERM GOAL #3   Title Left knee strength 4/5 or greater  for improved stability (09/17/15)   Status Achieved   PT LONG TERM GOAL #4   Title Ambulate on all surfaces without cane (or cane used only on left in conjuction with right AFO) with normal gait pattern  (09/17/15)   Status On-going               Plan - 09/13/15 1439    Clinical Impression Statement Minimal progress with ROM and strength but pt able to ambulate without SPC now. Good tolerance to all exercises and increased reps/weight.    PT Next Visit Plan TKR protocol - ROM, strengthening, modalities PRN, gait training (weaning SPC or swtiching to left hand); FOTO - 10   Consulted and Agree with Plan of Care Patient        Problem List Patient Active Problem List   Diagnosis Date Noted  . Total knee replacement status 07/13/2015    Barbette Hair, PTA 09/13/2015, 2:46 PM  [facility] [address]  [place], [place], [address] Phone: [phone]   Fax:  [phone]

## 2015-09-18 ENCOUNTER — Ambulatory Visit: Payer: 59 | Attending: Orthopaedic Surgery | Admitting: Physical Therapy

## 2015-09-18 DIAGNOSIS — M25662 Stiffness of left knee, not elsewhere classified: Secondary | ICD-10-CM | POA: Diagnosis not present

## 2015-09-18 DIAGNOSIS — R269 Unspecified abnormalities of gait and mobility: Secondary | ICD-10-CM | POA: Diagnosis present

## 2015-09-18 DIAGNOSIS — M25462 Effusion, left knee: Secondary | ICD-10-CM | POA: Diagnosis present

## 2015-09-18 DIAGNOSIS — M25562 Pain in left knee: Secondary | ICD-10-CM | POA: Diagnosis present

## 2015-09-18 DIAGNOSIS — R262 Difficulty in walking, not elsewhere classified: Secondary | ICD-10-CM

## 2015-09-18 DIAGNOSIS — R29898 Other symptoms and signs involving the musculoskeletal system: Secondary | ICD-10-CM | POA: Insufficient documentation

## 2015-09-18 NOTE — Therapy (Signed)
[facility] [address]  [place], [place], [address] Phone: [phone]   Fax:  [phone]  Physical Therapy Treatment  Patient Details  Name: [name] MRN: [id number] Date of Birth: April 04, 1961 Referring Provider:  Marybelle Killings, MD  Encounter Date: 09/18/2015      PT End of Session - 09/18/15 0943    Visit Number 12   Number of Visits 15   Date for PT Re-Evaluation 09/28/15   PT Start Time 0935   PT Stop Time 1015   PT Time Calculation (min) 40 min   Activity Tolerance Patient tolerated treatment well   Behavior During Therapy [facility] for tasks assessed/performed      Past Medical History  Diagnosis Date  . Hypertension   . Diabetes mellitus without complication     Past Surgical History  Procedure Laterality Date  . Knee arthroscopy Left 96  . Cholecystectomy  10  . Back surgery  09  . Eye surgery Bilateral     strabismus   54 yrs old  . Knee arthroplasty Left 07/13/2015    Procedure: COMPUTER ASSISTED TOTAL KNEE ARTHROPLASTY LEFT, REMOVE ANTERIOR CRUCIATE LIGAMENT SCREWS AND STAPLES;  Surgeon: Marybelle Killings, MD;  Location: [place];  Service: Orthopedics;  Laterality: Left;    There were no vitals filed for this visit.  Visit Diagnosis:  Stiffness of knee joint, left  Weakness of left leg  Difficulty walking  Abnormality of gait  Left knee pain  Swelling of left knee joint      Subjective Assessment - 09/18/15 0941    Subjective Patient notes still sensitive to changes in the weather, but otherwise pain/stiffness improving.   Currently in Pain? Yes   Pain Score 1             [facility] PT Assessment - 09/18/15 0935    ROM / Strength   AROM / PROM / Strength AROM;PROM;Strength   AROM   AROM Assessment Site Knee   Right/Left Knee Left   Left Knee Extension 6   Left Knee Flexion 112   PROM   PROM Assessment Site Knee   Right/Left Knee Left   Left Knee Extension 4   Left Knee  Flexion 118   Strength   Strength Assessment Site Knee;Hip   Right/Left Hip Left   Left Hip Flexion 4-/5   Left Hip Extension 4-/5   Left Hip ABduction 4/5   Left Hip ADduction 4/5   Right/Left Knee Left   Left Knee Flexion 4+/5   Left Knee Extension 4+/5   Ambulation/Gait   Stairs Assistance 6: Modified independent (Device/Increase time)   Stair Management Technique One rail Left;Alternating pattern   Number of Stairs 24   Height of Stairs 7   Gait Comments Provided cues for increased left stride length, improved foot clearance and proper heel strike to reduce tripping/fall hazard.                 [name] Adult PT Treatment/Exercise - 09/18/15 0935    Exercises   Exercises Knee/Hip   Knee/Hip Exercises: Aerobic   Recumbent Bike lvl 2 x6'   Knee/Hip Exercises: Machines for Strengthening   Cybex Knee Extension DL 45# 2x10, SL 20# 2x10   Cybex Knee Flexion DL 45# 2x12, SL 25# 2x10   Cybex Leg Press DL 35# x15, 45# x15   Knee/Hip Exercises: Standing   Functional Squat 20 reps;3 seconds   Functional Squat Limitations TRX  Manual Therapy   Manual Therapy Joint mobilization   Joint Mobilization Grade II-III extension mobs                     PT Long Term Goals - 09/18/15 1000    PT LONG TERM GOAL #1   Title Independent with HEP (09/28/15)   Status On-going   PT LONG TERM GOAL #2   Title Left knee ROM 3-120 or greater to allow normal motion with gait and stair climbing (09/28/15)   Status On-going   PT LONG TERM GOAL #3   Title Left knee strength 4/5 or greater for improved stability (09/17/15)   Status Achieved   PT LONG TERM GOAL #4   Title Ambulate on all surfaces without cane (or cane used only on left in conjuction with right AFO) with normal gait pattern  (09/28/15)   Status On-going   PT LONG TERM GOAL #5   Title Left hip strength 4/5 or greater for improved stability (09/28/15)   Time 2   Period Weeks   Status New                Plan - 09/18/15 1020    Clinical Impression Statement Left knee ROM continues to slowly improve but still lacking TKE and shy of flexion goal by 8 degrees. Left knee strength grossly 4+/5 but hip flexion and extension remain slightly weaker. Limited ROM and proximal LE strength deficits continue to contribute to decreased foot clearance on left during gait which places patient at continued increased risk for falls, therefore will extend POC x 3 more visits to continue emphasis on full functional left knee ROM and proximal left LE strengthening for improved gait stability.   PT Next Visit Plan TKR protocol - ROM, proximal strengthening, gait training to normalize gait pattern and decrease fal risk, modalities PRN   Consulted and Agree with Plan of Care Patient        Problem List Patient Active Problem List   Diagnosis Date Noted  . Total knee replacement status 07/13/2015    Percival Spanish, PT, MPT 09/18/2015, 10:35 AM  [facility] [address]  [place], [place], [address] Phone: [phone]   Fax:  [phone]

## 2015-09-20 ENCOUNTER — Ambulatory Visit: Payer: 59 | Admitting: Rehabilitation

## 2015-09-20 DIAGNOSIS — R269 Unspecified abnormalities of gait and mobility: Secondary | ICD-10-CM

## 2015-09-20 DIAGNOSIS — R262 Difficulty in walking, not elsewhere classified: Secondary | ICD-10-CM

## 2015-09-20 DIAGNOSIS — M25662 Stiffness of left knee, not elsewhere classified: Secondary | ICD-10-CM

## 2015-09-20 DIAGNOSIS — M25562 Pain in left knee: Secondary | ICD-10-CM

## 2015-09-20 DIAGNOSIS — R29898 Other symptoms and signs involving the musculoskeletal system: Secondary | ICD-10-CM

## 2015-09-20 DIAGNOSIS — M25462 Effusion, left knee: Secondary | ICD-10-CM

## 2015-09-20 NOTE — Therapy (Signed)
[facility] [address]  [place], [place], [address] Phone: [phone]   Fax:  [phone]  Physical Therapy Treatment  Patient Details  Name: [name] MRN: [id number] Date of Birth: 20-Feb-1961 Referring Provider:  Marybelle Killings, MD  Encounter Date: 09/20/2015      PT End of Session - 09/20/15 0941    Visit Number 13   Number of Visits 15   Date for PT Re-Evaluation 09/28/15   PT Start Time 0938   PT Stop Time 1025   PT Time Calculation (min) 47 min      Past Medical History  Diagnosis Date  . Hypertension   . Diabetes mellitus without complication     Past Surgical History  Procedure Laterality Date  . Knee arthroscopy Left 96  . Cholecystectomy  10  . Back surgery  09  . Eye surgery Bilateral     strabismus   54 yrs old  . Knee arthroplasty Left 07/13/2015    Procedure: COMPUTER ASSISTED TOTAL KNEE ARTHROPLASTY LEFT, REMOVE ANTERIOR CRUCIATE LIGAMENT SCREWS AND STAPLES;  Surgeon: Marybelle Killings, MD;  Location: [place];  Service: Orthopedics;  Laterality: Left;    There were no vitals filed for this visit.  Visit Diagnosis:  Stiffness of knee joint, left  Weakness of left leg  Abnormality of gait  Difficulty walking  Left knee pain  Swelling of left knee joint      Subjective Assessment - 09/20/15 0940    Subjective Reports his knee is a little achy, thinks it might be from the change in weather.    Currently in Pain? Yes   Pain Score 1    Pain Location Knee   Pain Orientation Left   Pain Descriptors / Indicators Aching                         [facility] Adult PT Treatment/Exercise - 09/20/15 0942    Exercises   Exercises Knee/Hip   Knee/Hip Exercises: Aerobic   Recumbent Bike lvl 2 x6'   Knee/Hip Exercises: Machines for Strengthening   Cybex Knee Extension DL 45# 2x12, SL 20# 2x12   Cybex Knee Flexion DL 45# 2x12, SL 25# 2x12   Cybex Leg Press DL 45# 2x15   Knee/Hip Exercises: Standing   Hip Flexion Stengthening;Both;15 reps;Knee straight   Hip Flexion Limitations Green TB    Terminal Knee Extension Strengthening;Left;20 reps   Terminal Knee Extension Limitations 5" hold with small ball behind knee   Hip Abduction Stengthening;Both;15 reps;Knee straight   Abduction Limitations Green TB    Hip Extension Stengthening;Both;15 reps;Knee straight   Extension Limitations Green TB   Functional Squat 20 reps;3 seconds   Functional Squat Limitations TRX   Other Standing Knee Exercises Side-stepping with Green TB at ankles 1ftx3 each way   Other Standing Knee Exercises TM Hamstring pull x20, TM TKE push x20                     PT Long Term Goals - 09/18/15 1000    PT LONG TERM GOAL #1   Title Independent with HEP (09/28/15)   Status On-going   PT LONG TERM GOAL #2   Title Left knee ROM 3-120 or greater to allow normal motion with gait and stair climbing (09/28/15)   Status On-going   PT LONG TERM GOAL #3   Title Left knee strength 4/5 or greater for  improved stability (09/17/15)   Status Achieved   PT LONG TERM GOAL #4   Title Ambulate on all surfaces without cane (or cane used only on left in conjuction with right AFO) with normal gait pattern  (09/28/15)   Status On-going   PT LONG TERM GOAL #5   Title Left hip strength 4/5 or greater for improved stability (09/28/15)   Time 2   Period Weeks   Status New               Plan - 09/20/15 1021    Clinical Impression Statement Incorportated more hip strengthening today with good tolerance and also able to increase reps with leg machines. Needed some verbal cues for proper technique during hip exercises (keeping knee straight, not leaning with trunk).    PT Next Visit Plan TKR protocol - ROM, proximal strengthening, gait training to normalize gait pattern and decrease fal risk, modalities PRN   Consulted and Agree with Plan of Care Patient        Problem List Patient  Active Problem List   Diagnosis Date Noted  . Total knee replacement status 07/13/2015    Barbette Hair, PTA 09/20/2015, 10:23 AM  [facility] [address]  [place], [place], [address] Phone: [phone]   Fax:  [phone]

## 2015-09-25 ENCOUNTER — Ambulatory Visit: Payer: 59 | Admitting: Rehabilitation

## 2015-09-27 ENCOUNTER — Ambulatory Visit: Payer: 59 | Admitting: Physical Therapy

## 2015-09-27 DIAGNOSIS — M25662 Stiffness of left knee, not elsewhere classified: Secondary | ICD-10-CM

## 2015-09-27 DIAGNOSIS — R269 Unspecified abnormalities of gait and mobility: Secondary | ICD-10-CM

## 2015-09-27 DIAGNOSIS — M25462 Effusion, left knee: Secondary | ICD-10-CM

## 2015-09-27 DIAGNOSIS — M25562 Pain in left knee: Secondary | ICD-10-CM

## 2015-09-27 DIAGNOSIS — R262 Difficulty in walking, not elsewhere classified: Secondary | ICD-10-CM

## 2015-09-27 DIAGNOSIS — R29898 Other symptoms and signs involving the musculoskeletal system: Secondary | ICD-10-CM

## 2015-09-27 NOTE — Therapy (Addendum)
[facility] [address]  [place], [place], [address] Phone: [phone]   Fax:  [phone]  Physical Therapy Treatment  Patient Details  Name: [name] MRN: [id number] Date of Birth: 1961-04-23 Referring Provider:  Marybelle Killings, MD  Encounter Date: 09/27/2015      PT End of Session - 09/27/15 0942    Visit Number 14   Number of Visits 15   Date for PT Re-Evaluation 09/28/15   PT Start Time 0935   PT Stop Time 1018   PT Time Calculation (min) 43 min   Activity Tolerance Patient tolerated treatment well   Behavior During Therapy [facility] for tasks assessed/performed      Past Medical History  Diagnosis Date  . Hypertension   . Diabetes mellitus without complication     Past Surgical History  Procedure Laterality Date  . Knee arthroscopy Left 96  . Cholecystectomy  10  . Back surgery  09  . Eye surgery Bilateral     strabismus   54 yrs old  . Knee arthroplasty Left 07/13/2015    Procedure: COMPUTER ASSISTED TOTAL KNEE ARTHROPLASTY LEFT, REMOVE ANTERIOR CRUCIATE LIGAMENT SCREWS AND STAPLES;  Surgeon: Marybelle Killings, MD;  Location: [place];  Service: Orthopedics;  Laterality: Left;    There were no vitals filed for this visit.  Visit Diagnosis:  Stiffness of knee joint, left  Weakness of left leg  Abnormality of gait  Difficulty walking  Left knee pain  Swelling of left knee joint      Subjective Assessment - 09/27/15 0939    Subjective Reports knee is a little stiff today but has been doing a lot of walking recently as mother-in-law in hospital. Was 3/10 on rising this morning but now 1/10.   Currently in Pain? Yes   Pain Score 1    Pain Location Knee   Pain Orientation Left            [facility] PT Assessment - 09/27/15 0935    Observation/Other Assessments   Focus on Therapeutic Outcomes (FOTO)  63% (37% limitation)   ROM / Strength   AROM / PROM / Strength AROM;Strength   AROM   AROM  Assessment Site Knee   Right/Left Knee Left   Left Knee Extension 3   Left Knee Flexion 115   PROM   PROM Assessment Site Knee   Right/Left Knee Left   Left Knee Extension 2   Left Knee Flexion 120   Strength   Strength Assessment Site Knee;Hip   Right/Left Hip Left   Left Hip Flexion 4/5   Left Hip Extension 4-/5   Left Hip ABduction 4+/5   Left Hip ADduction 4/5   Right/Left Knee Left   Left Knee Flexion 4+/5   Left Knee Extension --  5-/5                 [facility] Adult PT Treatment/Exercise - 09/27/15 0935    Exercises   Exercises Knee/Hip   Knee/Hip Exercises: Aerobic   Nustep lvl 6 x5" (LE only)   Knee/Hip Exercises: Machines for Strengthening   Cybex Leg Press DL 45# 2x15   Knee/Hip Exercises: Standing   Hip Flexion Stengthening;Both;20 reps;Knee straight   Hip Flexion Limitations Green TB    Hip ADduction Strengthening;Both;20 reps   Hip ADduction Limitations Green TB   Hip Abduction Stengthening;Both;20 reps;Knee straight   Abduction Limitations Green TB    Hip Extension Stengthening;Both;20 reps;Knee  straight   Extension Limitations Green TB   Functional Squat 20 reps;3 seconds   Functional Squat Limitations TRX, with heel raise on return to stand   Other Standing Knee Exercises Side-stepping with Green TB at ankles 20 ft x2 each way                 PT Long Term Goals - 09/27/15 1015    PT LONG TERM GOAL #1   Title Independent with HEP (09/28/15)   Status Achieved   PT LONG TERM GOAL #2   Title Left knee ROM 3-120 or greater to allow normal motion with gait and stair climbing (09/28/15)   Status Achieved   PT LONG TERM GOAL #3   Title Left knee strength 4/5 or greater for improved stability (09/17/15)   Status Achieved   PT LONG TERM GOAL #4   Title Ambulate on all surfaces without cane (or cane used only on left in conjuction with right AFO) with normal gait pattern  (09/28/15)   Status Achieved   PT LONG TERM GOAL #5   Title Left hip  strength 4/5 or greater for improved stability (09/28/15)   Status Partially Met  Met except hip extension 4-/5               Plan - 09/27/15 1203    Clinical Impression Statement Patient has demonstrated good progress with physical therapy with left knee ROM now 3-115 actively and 2-120 passively, left knee strength 4+/5 in hamstrings and 5-/5 in quads, and left hip strength > 4/5 except hip extension 4-/5. Patient is ambulating without an AD with good gait pattern when self-aware but does still demonstrate occasional decreased left foot clearance secondary to pre-existing right ankle instability from right foot drop when distracted during gait. Able to ascend/descend stairs reciprocally with one railing while demonstrating good step pattern. All goals essentially met and patient is independent with HEP. Patient appears ready for D/C unless further needs identified at MD visit tomorrow.   PT Next Visit Plan D/C unless further needs identified by MD   Consulted and Agree with Plan of Care Patient        Problem List Patient Active Problem List   Diagnosis Date Noted  . Total knee replacement status 07/13/2015    Percival Spanish, PT, MPT 09/27/2015, 6:18 PM  [facility] [place], [place], [address] Phone: [phone]   Fax:  [phone]     PHYSICAL THERAPY DISCHARGE SUMMARY  Visits from Start of Care: 14  Current functional level related to goals / functional outcomes:  Patient has demonstrated good progress with physical therapy with left knee ROM now 3-115 actively and 2-120 passively, left knee strength 4+/5 in hamstrings and 5-/5 in quads, and left hip strength > 4/5 except hip extension 4-/5. Patient is ambulating without an AD with good gait pattern when self-aware but does still demonstrate occasional decreased left foot clearance secondary to pre-existing right ankle instability from right  foot drop when distracted during gait. Able to ascend/descend stairs reciprocally with one railing while demonstrating good step pattern. All goals essentially met and patient is independent with HEP.   Remaining deficits:  Intermittent gait abnormality from preexisting right foot drop, but able to demonstrate good gait pattern with attention to stepping pattern.    Education / Equipment:  HEP  Plan: Patient agrees to discharge.  Patient goals were met. Patient is being discharged due to  meeting the stated rehab goals.  ?????       Percival Spanish, PT, MPT 10/16/2015, 2:34 PM  [facility] [address]  [place], [place], [address] Phone: [phone]   Fax:  [phone]

## 2016-04-02 ENCOUNTER — Encounter (HOSPITAL_COMMUNITY)
Admission: RE | Admit: 2016-04-02 | Discharge: 2016-04-02 | Disposition: A | Discharge status: Home / Self Care | Payer: 59 | Source: Ambulatory Visit | Attending: Orthopaedic Surgery | Admitting: Orthopaedic Surgery

## 2016-04-02 ENCOUNTER — Encounter (HOSPITAL_COMMUNITY): Payer: Self-pay

## 2016-04-02 DIAGNOSIS — Z01812 Encounter for preprocedural laboratory examination: Secondary | ICD-10-CM | POA: Insufficient documentation

## 2016-04-02 DIAGNOSIS — X58XXXA Exposure to other specified factors, initial encounter: Secondary | ICD-10-CM | POA: Diagnosis not present

## 2016-04-02 DIAGNOSIS — S82002A Unspecified fracture of left patella, initial encounter for closed fracture: Secondary | ICD-10-CM | POA: Insufficient documentation

## 2016-04-02 HISTORY — DX: Polyneuropathy, unspecified: G62.9

## 2016-04-02 HISTORY — DX: Unspecified osteoarthritis, unspecified site: M19.90

## 2016-04-02 HISTORY — DX: Presence of spectacles and contact lenses: Z97.3

## 2016-04-02 HISTORY — DX: Foot drop, right foot: M21.371

## 2016-04-02 LAB — BASIC METABOLIC PANEL
Anion gap: 9 (ref 5–15)
BUN: 10 mg/dL (ref 6–20)
CHLORIDE: 104 mmol/L (ref 101–111)
CO2: 25 mmol/L (ref 22–32)
Calcium: 9.3 mg/dL (ref 8.9–10.3)
Creatinine, Ser: 1.03 mg/dL (ref 0.61–1.24)
GFR calc Af Amer: >60 mL/min (ref >60)
GFR calc non Af Amer: >60 mL/min (ref >60)
Glucose, Bld: 234 mg/dL — ABNORMAL HIGH (ref 65–99)
Potassium: 3.8 mmol/L (ref 3.5–5.1)
Sodium: 138 mmol/L (ref 135–145)

## 2016-04-02 LAB — CBC
HCT: 43.6 % (ref 39.0–52.0)
Hemoglobin: 15 g/dL (ref 13.0–17.0)
MCH: 32.1 pg (ref 26.0–34.0)
MCHC: 34.4 g/dL (ref 30.0–36.0)
MCV: 93.4 fL (ref 78.0–100.0)
PLATELETS: 288 10*3/uL (ref 150–400)
RBC: 4.67 MIL/uL (ref 4.22–5.81)
RDW: 13.3 % (ref 11.5–15.5)
WBC: 8.9 10*3/uL (ref 4.0–10.5)

## 2016-04-02 LAB — GLUCOSE, CAPILLARY: Glucose-Capillary: 225 mg/dL — ABNORMAL HIGH (ref 65–99)

## 2016-04-02 NOTE — Progress Notes (Signed)
   04/02/16 1026  OBSTRUCTIVE SLEEP APNEA  Have you ever been diagnosed with sleep apnea through a sleep study? No  Do you snore loudly (loud enough to be heard through closed doors)?  0  Do you often feel tired, fatigued, or sleepy during the daytime (such as falling asleep during driving or talking to someone)? 0  Has anyone observed you stop breathing during your sleep? 0  Do you have, or are you being treated for high blood pressure? 1  BMI more than 35 kg/m2? 1  Age > 50 (1-yes) 1  Neck circumference greater than:Male 16 inches or larger, Male 17inches or larger? 1  Male Gender (Yes=1) 1  Obstructive Sleep Apnea Score 5

## 2016-04-02 NOTE — Pre-Procedure Instructions (Signed)
[name]  04/02/2016      [id number] - [place], [place] - [address] [name] [address] Phone: [phone] Fax: [phone]    Your procedure is scheduled on Monday, April 07, 2016  Report to [facility] Admitting at 1:30 P.M.  Call this number if you have problems the morning of surgery:  [phone]   Remember:  Do not eat food or drink liquids after midnight Sunday, April 06, 2016  Take these medicines the morning of surgery with A SIP OF WATER : amLODipine (NORVASC)  Stop taking Aspirin, vitamins, fish oil and herbal medications. Do not take any NSAIDs ie: Ibuprofen, Advil, Naproxen, BC and Goody Powder or any medication containing Aspirin; stop now.   How to Manage Your Diabetes Before and After Surgery  Why is it important to control my blood sugar before and after surgery? . Improving blood sugar levels before and after surgery helps healing and can limit problems. . A way of improving blood sugar control is eating a healthy diet by: o  Eating less sugar and carbohydrates o  Increasing activity/exercise o  Talking with your doctor about reaching your blood sugar goals . High blood sugars (greater than 180 mg/dL) can raise your risk of infections and slow your recovery, so you will need to focus on controlling your diabetes during the weeks before surgery. . Make sure that the doctor who takes care of your diabetes knows about your planned surgery including the date and location.  How do I manage my blood sugar before surgery? . Check your blood sugar at least 4 times a day, starting 2 days before surgery, to make sure that the level is not too high or low. o Check your blood sugar the morning of your surgery when you wake up and every 2 hours until you get to the Short Stay unit. . If your blood sugar is less than 70 mg/dL, you will need to treat for low blood sugar: o Do not take  insulin. o Treat a low blood sugar (less than 70 mg/dL) with  cup of clear juice (cranberry or apple), 4 glucose tablets, OR glucose gel. o Recheck blood sugar in 15 minutes after treatment (to make sure it is greater than 70 mg/dL). If your blood sugar is not greater than 70 mg/dL on recheck, call [phone] for further instructions. . Report your blood sugar to the short stay nurse when you get to Short Stay.  . If you are admitted to the hospital after surgery: o Your blood sugar will be checked by the staff and you will probably be given insulin after surgery (instead of oral diabetes medicines) to make sure you have good blood sugar levels. o The goal for blood sugar control after surgery is 80-180 mg/dL.  WHAT DO I DO ABOUT MY DIABETES MEDICATION?   Marland Kitchen Do not take oral diabetes medicines (pills) the morning of surgery such as glipiZIDE (GLUCOTROL XL), metFORMIN (GLUCOPHAGE), and pioglitazone (ACTOS).  Other Instructions: Do not take glipiZIDE (GLUCOTROL XL) the night before surgery  Patient Signature:  Date:   Nurse Signature:  Date:   Reviewed and Endorsed by [facility] Patient Education Committee, August 2015   Do not wear jewelry, make-up or nail polish.  Do not wear lotions, powders, or perfumes.  You may not wear deodorant.  Do not shave 48 hours prior to surgery.  Men may shave face and neck.  Do not bring valuables to the hospital.  [facility] is not responsible for any belongings or valuables.  Contacts, dentures or bridgework may not be worn into surgery.  Leave your suitcase in the car.  After surgery it may be brought to your room.  For patients admitted to the hospital, discharge time will be determined by your treatment team.  Patients discharged the day of surgery will not be allowed to drive home.   Name and phone number of your driver:  Special instructions: Shower the night before surgery and the morning of surgery with CHG.  Please read over the  following fact sheets that you were given. Pain Booklet, Coughing and Deep Breathing and Surgical Site Infection Prevention

## 2016-04-02 NOTE — Progress Notes (Signed)
Pt denies SOB, chest pain, and being under the care of a cardiologist. Pt stated that a stress test was performed     >10 ago but denies having an echo and cardiac cath. Pt denies having an A1c within the last 60 days. Pt PCP is Dr. [name] of [facility]. Pt stated that his fasting blood sugar usually ranges from 120-130.

## 2016-04-03 LAB — HEMOGLOBIN A1C
HEMOGLOBIN A1C: 8.2 % — AB (ref 4.8–5.6)
MEAN PLASMA GLUCOSE: 189 mg/dL

## 2016-04-04 NOTE — Progress Notes (Signed)
Notified patient of time change and instructed him to arrive at 1220pm on 04/07/16.

## 2016-04-06 MED ORDER — DEXTROSE 5 % IV SOLN
3.0000 g | INTRAVENOUS | Status: AC
  Administered 2016-04-07: 3 g via INTRAVENOUS
  Filled 2016-04-06: qty 3000

## 2016-04-07 ENCOUNTER — Inpatient Hospital Stay (HOSPITAL_COMMUNITY): Payer: 59 | Admitting: Emergency Medicine

## 2016-04-07 ENCOUNTER — Inpatient Hospital Stay (HOSPITAL_COMMUNITY): Payer: 59

## 2016-04-07 ENCOUNTER — Inpatient Hospital Stay (HOSPITAL_COMMUNITY): Payer: 59 | Admitting: Anesthesiology

## 2016-04-07 ENCOUNTER — Encounter (HOSPITAL_COMMUNITY)
Admission: RE | Disposition: A | Discharge status: Home / Self Care | Payer: Self-pay | Source: Ambulatory Visit | Attending: Orthopaedic Surgery

## 2016-04-07 ENCOUNTER — Inpatient Hospital Stay (HOSPITAL_COMMUNITY)
Admission: RE | Admit: 2016-04-07 | Discharge: 2016-04-09 | DRG: 516 | Disposition: A | Discharge status: Home / Self Care | Payer: 59 | Source: Ambulatory Visit | Attending: Orthopaedic Surgery | Admitting: Orthopaedic Surgery

## 2016-04-07 ENCOUNTER — Encounter (HOSPITAL_COMMUNITY): Payer: Self-pay | Admitting: Anesthesiology

## 2016-04-07 DIAGNOSIS — Z9181 History of falling: Secondary | ICD-10-CM | POA: Diagnosis not present

## 2016-04-07 DIAGNOSIS — I10 Essential (primary) hypertension: Secondary | ICD-10-CM | POA: Diagnosis present

## 2016-04-07 DIAGNOSIS — W010XXA Fall on same level from slipping, tripping and stumbling without subsequent striking against object, initial encounter: Secondary | ICD-10-CM | POA: Diagnosis present

## 2016-04-07 DIAGNOSIS — Z79899 Other long term (current) drug therapy: Secondary | ICD-10-CM

## 2016-04-07 DIAGNOSIS — S82032A Displaced transverse fracture of left patella, initial encounter for closed fracture: Principal | ICD-10-CM | POA: Diagnosis present

## 2016-04-07 DIAGNOSIS — M21371 Foot drop, right foot: Secondary | ICD-10-CM | POA: Diagnosis present

## 2016-04-07 DIAGNOSIS — Z96652 Presence of left artificial knee joint: Secondary | ICD-10-CM | POA: Diagnosis present

## 2016-04-07 DIAGNOSIS — M109 Gout, unspecified: Secondary | ICD-10-CM | POA: Diagnosis present

## 2016-04-07 DIAGNOSIS — Z7984 Long term (current) use of oral hypoglycemic drugs: Secondary | ICD-10-CM | POA: Diagnosis not present

## 2016-04-07 DIAGNOSIS — Z6841 Body Mass Index (BMI) 40.0 and over, adult: Secondary | ICD-10-CM | POA: Diagnosis not present

## 2016-04-07 DIAGNOSIS — M25562 Pain in left knee: Secondary | ICD-10-CM | POA: Diagnosis present

## 2016-04-07 DIAGNOSIS — E1140 Type 2 diabetes mellitus with diabetic neuropathy, unspecified: Secondary | ICD-10-CM | POA: Diagnosis present

## 2016-04-07 DIAGNOSIS — Z419 Encounter for procedure for purposes other than remedying health state, unspecified: Secondary | ICD-10-CM

## 2016-04-07 DIAGNOSIS — S82009A Unspecified fracture of unspecified patella, initial encounter for closed fracture: Secondary | ICD-10-CM | POA: Diagnosis present

## 2016-04-07 HISTORY — PX: ORIF PATELLA: SHX5033

## 2016-04-07 LAB — GLUCOSE, CAPILLARY
GLUCOSE-CAPILLARY: 196 mg/dL — AB (ref 65–99)
GLUCOSE-CAPILLARY: 215 mg/dL — AB (ref 65–99)
Glucose-Capillary: 210 mg/dL — ABNORMAL HIGH (ref 65–99)

## 2016-04-07 LAB — COMPREHENSIVE METABOLIC PANEL
ALBUMIN: 3.9 g/dL (ref 3.5–5.0)
ALT: 20 U/L (ref 17–63)
ANION GAP: 10 (ref 5–15)
AST: 21 U/L (ref 15–41)
Alkaline Phosphatase: 80 U/L (ref 38–126)
BUN: 11 mg/dL (ref 6–20)
CALCIUM: 9.6 mg/dL (ref 8.9–10.3)
CO2: 24 mmol/L (ref 22–32)
Chloride: 102 mmol/L (ref 101–111)
Creatinine, Ser: 0.84 mg/dL (ref 0.61–1.24)
GFR calc non Af Amer: >60 mL/min (ref >60)
GLUCOSE: 226 mg/dL — AB (ref 65–99)
POTASSIUM: 4.1 mmol/L (ref 3.5–5.1)
SODIUM: 136 mmol/L (ref 135–145)
Total Bilirubin: 0.7 mg/dL (ref 0.3–1.2)
Total Protein: 7.3 g/dL (ref 6.5–8.1)

## 2016-04-07 LAB — APTT: APTT: 30 s (ref 24–37)

## 2016-04-07 LAB — URINALYSIS, ROUTINE W REFLEX MICROSCOPIC
Bilirubin Urine: NEGATIVE
Hgb urine dipstick: NEGATIVE
KETONES UR: 15 mg/dL — AB
LEUKOCYTES UA: NEGATIVE
NITRITE: NEGATIVE
PROTEIN: NEGATIVE mg/dL
Specific Gravity, Urine: 1.02 (ref 1.005–1.030)
pH: 6 (ref 5.0–8.0)

## 2016-04-07 LAB — URINE MICROSCOPIC-ADD ON: RBC / HPF: NONE SEEN RBC/hpf (ref 0–5)

## 2016-04-07 LAB — PROTIME-INR
INR: 1.07 (ref 0.00–1.49)
Prothrombin Time: 14.1 seconds (ref 11.6–15.2)

## 2016-04-07 SURGERY — OPEN REDUCTION INTERNAL FIXATION (ORIF) PATELLA
Anesthesia: General | Site: Knee | Laterality: Left

## 2016-04-07 MED ORDER — BUPIVACAINE HCL (PF) 0.25 % IJ SOLN
INTRAMUSCULAR | Status: DC | PRN
  Administered 2016-04-07: 24 mL

## 2016-04-07 MED ORDER — FENTANYL CITRATE (PF) 100 MCG/2ML IJ SOLN
100.0000 ug | Freq: Once | INTRAMUSCULAR | Status: AC
  Administered 2016-04-07: 100 ug via INTRAVENOUS

## 2016-04-07 MED ORDER — ACETAMINOPHEN 325 MG PO TABS: 650.0000 mg | ORAL_TABLET | Freq: Four times a day (QID) | ORAL | Status: DC | PRN

## 2016-04-07 MED ORDER — ONDANSETRON HCL 4 MG/2ML IJ SOLN
INTRAMUSCULAR | Status: AC
  Filled 2016-04-07: qty 2

## 2016-04-07 MED ORDER — DOCUSATE SODIUM 100 MG PO CAPS
100.0000 mg | ORAL_CAPSULE | Freq: Two times a day (BID) | ORAL | Status: DC
  Administered 2016-04-07 – 2016-04-09 (×4): 100 mg via ORAL
  Filled 2016-04-07 (×4): qty 1

## 2016-04-07 MED ORDER — ACETAMINOPHEN 650 MG RE SUPP: 650.0000 mg | Freq: Four times a day (QID) | RECTAL | Status: DC | PRN

## 2016-04-07 MED ORDER — ASPIRIN 325 MG PO TABS
325.0000 mg | ORAL_TABLET | Freq: Every day | ORAL | Status: DC
  Administered 2016-04-07 – 2016-04-09 (×3): 325 mg via ORAL
  Filled 2016-04-07 (×3): qty 1

## 2016-04-07 MED ORDER — LIDOCAINE HCL (CARDIAC) 20 MG/ML IV SOLN
INTRAVENOUS | Status: AC
  Filled 2016-04-07: qty 5

## 2016-04-07 MED ORDER — METFORMIN HCL 500 MG PO TABS
1000.0000 mg | ORAL_TABLET | Freq: Two times a day (BID) | ORAL | Status: DC
  Administered 2016-04-07 – 2016-04-09 (×4): 1000 mg via ORAL
  Filled 2016-04-07 (×4): qty 2

## 2016-04-07 MED ORDER — CHLORHEXIDINE GLUCONATE 4 % EX LIQD: 60.0000 mL | Freq: Once | CUTANEOUS | Status: DC

## 2016-04-07 MED ORDER — SUGAMMADEX SODIUM 500 MG/5ML IV SOLN
INTRAVENOUS | Status: AC
  Filled 2016-04-07: qty 5

## 2016-04-07 MED ORDER — BUPIVACAINE-EPINEPHRINE (PF) 0.25% -1:200000 IJ SOLN
INTRAMUSCULAR | Status: AC
  Filled 2016-04-07: qty 30

## 2016-04-07 MED ORDER — GLIPIZIDE ER 10 MG PO TB24
10.0000 mg | ORAL_TABLET | Freq: Two times a day (BID) | ORAL | Status: DC
  Administered 2016-04-07 – 2016-04-09 (×4): 10 mg via ORAL
  Filled 2016-04-07 (×6): qty 1

## 2016-04-07 MED ORDER — ONDANSETRON HCL 4 MG PO TABS: 4.0000 mg | ORAL_TABLET | Freq: Four times a day (QID) | ORAL | Status: DC | PRN

## 2016-04-07 MED ORDER — ROCURONIUM BROMIDE 100 MG/10ML IV SOLN
INTRAVENOUS | Status: DC | PRN
  Administered 2016-04-07: 50 mg via INTRAVENOUS

## 2016-04-07 MED ORDER — SUGAMMADEX SODIUM 500 MG/5ML IV SOLN
INTRAVENOUS | Status: DC | PRN
  Administered 2016-04-07: 300 mg via INTRAVENOUS

## 2016-04-07 MED ORDER — METOCLOPRAMIDE HCL 5 MG/ML IJ SOLN: 5.0000 mg | Freq: Three times a day (TID) | INTRAMUSCULAR | Status: DC | PRN

## 2016-04-07 MED ORDER — MIDAZOLAM HCL 2 MG/2ML IJ SOLN
INTRAMUSCULAR | Status: AC
  Administered 2016-04-07: 2 mg
  Filled 2016-04-07: qty 2

## 2016-04-07 MED ORDER — ONDANSETRON HCL 4 MG/2ML IJ SOLN
4.0000 mg | Freq: Once | INTRAMUSCULAR | Status: AC | PRN
  Administered 2016-04-07: 4 mg via INTRAVENOUS

## 2016-04-07 MED ORDER — 0.9 % SODIUM CHLORIDE (POUR BTL) OPTIME
TOPICAL | Status: DC | PRN
  Administered 2016-04-07: 1000 mL

## 2016-04-07 MED ORDER — SUCCINYLCHOLINE CHLORIDE 20 MG/ML IJ SOLN
INTRAMUSCULAR | Status: DC | PRN
  Administered 2016-04-07: 120 mg via INTRAVENOUS

## 2016-04-07 MED ORDER — PROPOFOL 10 MG/ML IV BOLUS
INTRAVENOUS | Status: AC
  Filled 2016-04-07: qty 20

## 2016-04-07 MED ORDER — MIDAZOLAM HCL 5 MG/5ML IJ SOLN
INTRAMUSCULAR | Status: DC | PRN
  Administered 2016-04-07: 2 mg via INTRAVENOUS

## 2016-04-07 MED ORDER — SUCCINYLCHOLINE CHLORIDE 20 MG/ML IJ SOLN
INTRAMUSCULAR | Status: AC
  Filled 2016-04-07: qty 1

## 2016-04-07 MED ORDER — CEFAZOLIN SODIUM 1-5 GM-% IV SOLN
1.0000 g | Freq: Four times a day (QID) | INTRAVENOUS | Status: AC
  Administered 2016-04-07 – 2016-04-08 (×3): 1 g via INTRAVENOUS
  Filled 2016-04-07 (×3): qty 50

## 2016-04-07 MED ORDER — ONDANSETRON HCL 4 MG/2ML IJ SOLN
4.0000 mg | Freq: Four times a day (QID) | INTRAMUSCULAR | Status: DC | PRN
  Administered 2016-04-08: 4 mg via INTRAVENOUS
  Filled 2016-04-07: qty 2

## 2016-04-07 MED ORDER — OXYCODONE HCL 5 MG PO TABS
5.0000 mg | ORAL_TABLET | ORAL | Status: DC | PRN
  Administered 2016-04-07 – 2016-04-08 (×5): 10 mg via ORAL
  Filled 2016-04-07 (×5): qty 2

## 2016-04-07 MED ORDER — MIDAZOLAM HCL 2 MG/2ML IJ SOLN
INTRAMUSCULAR | Status: AC
  Filled 2016-04-07: qty 2

## 2016-04-07 MED ORDER — LIDOCAINE HCL (CARDIAC) 20 MG/ML IV SOLN
INTRAVENOUS | Status: DC | PRN
  Administered 2016-04-07: 50 mg via INTRAVENOUS

## 2016-04-07 MED ORDER — FENTANYL CITRATE (PF) 100 MCG/2ML IJ SOLN
INTRAMUSCULAR | Status: DC | PRN
  Administered 2016-04-07 (×2): 100 ug via INTRAVENOUS
  Administered 2016-04-07: 150 ug via INTRAVENOUS
  Administered 2016-04-07: 100 ug via INTRAVENOUS
  Administered 2016-04-07: 50 ug via INTRAVENOUS

## 2016-04-07 MED ORDER — METOCLOPRAMIDE HCL 5 MG PO TABS: 5.0000 mg | ORAL_TABLET | Freq: Three times a day (TID) | ORAL | Status: DC | PRN

## 2016-04-07 MED ORDER — GLIPIZIDE ER 10 MG PO TB24
10.0000 mg | ORAL_TABLET | Freq: Two times a day (BID) | ORAL | Status: DC
  Filled 2016-04-07 (×2): qty 1

## 2016-04-07 MED ORDER — SODIUM CHLORIDE 0.45 % IV SOLN
INTRAVENOUS | Status: DC
  Administered 2016-04-07: 21:00:00 via INTRAVENOUS

## 2016-04-07 MED ORDER — BUPIVACAINE-EPINEPHRINE (PF) 0.5% -1:200000 IJ SOLN
INTRAMUSCULAR | Status: DC | PRN
  Administered 2016-04-07: 30 mL via PERINEURAL

## 2016-04-07 MED ORDER — ROCURONIUM BROMIDE 50 MG/5ML IV SOLN
INTRAVENOUS | Status: AC
  Filled 2016-04-07: qty 1

## 2016-04-07 MED ORDER — PIOGLITAZONE HCL 45 MG PO TABS
45.0000 mg | ORAL_TABLET | Freq: Every day | ORAL | Status: DC
  Administered 2016-04-07 – 2016-04-09 (×3): 45 mg via ORAL
  Filled 2016-04-07 (×3): qty 1

## 2016-04-07 MED ORDER — METHOCARBAMOL 500 MG PO TABS
500.0000 mg | ORAL_TABLET | Freq: Four times a day (QID) | ORAL | Status: DC | PRN
  Filled 2016-04-07: qty 1

## 2016-04-07 MED ORDER — FENTANYL CITRATE (PF) 250 MCG/5ML IJ SOLN
INTRAMUSCULAR | Status: AC
  Filled 2016-04-07: qty 10

## 2016-04-07 MED ORDER — BUPIVACAINE HCL (PF) 0.25 % IJ SOLN
INTRAMUSCULAR | Status: AC
  Filled 2016-04-07: qty 30

## 2016-04-07 MED ORDER — MIDAZOLAM HCL 2 MG/2ML IJ SOLN: 2.0000 mg | Freq: Once | INTRAMUSCULAR | Status: DC

## 2016-04-07 MED ORDER — POLYETHYLENE GLYCOL 3350 17 G PO PACK: 17.0000 g | PACK | Freq: Every day | ORAL | Status: DC | PRN

## 2016-04-07 MED ORDER — LISINOPRIL 40 MG PO TABS
40.0000 mg | ORAL_TABLET | Freq: Every day | ORAL | Status: DC
  Administered 2016-04-07 – 2016-04-09 (×3): 40 mg via ORAL
  Filled 2016-04-07 (×3): qty 1

## 2016-04-07 MED ORDER — PRAVASTATIN SODIUM 20 MG PO TABS
10.0000 mg | ORAL_TABLET | Freq: Every day | ORAL | Status: DC
  Administered 2016-04-07 – 2016-04-08 (×2): 10 mg via ORAL
  Filled 2016-04-07 (×2): qty 1

## 2016-04-07 MED ORDER — PROPOFOL 10 MG/ML IV BOLUS
INTRAVENOUS | Status: DC | PRN
  Administered 2016-04-07: 200 mg via INTRAVENOUS

## 2016-04-07 MED ORDER — FENTANYL CITRATE (PF) 100 MCG/2ML IJ SOLN
INTRAMUSCULAR | Status: AC
  Filled 2016-04-07: qty 2

## 2016-04-07 MED ORDER — HYDROMORPHONE HCL 1 MG/ML IJ SOLN: 0.2500 mg | INTRAMUSCULAR | Status: DC | PRN

## 2016-04-07 MED ORDER — LACTATED RINGERS IV SOLN
INTRAVENOUS | Status: DC
  Administered 2016-04-07 (×3): via INTRAVENOUS

## 2016-04-07 MED ORDER — ONDANSETRON HCL 4 MG/2ML IJ SOLN
INTRAMUSCULAR | Status: DC | PRN
  Administered 2016-04-07: 4 mg via INTRAVENOUS

## 2016-04-07 MED ORDER — HYDROMORPHONE HCL 1 MG/ML IJ SOLN: 1.0000 mg | INTRAMUSCULAR | Status: DC | PRN

## 2016-04-07 MED ORDER — AMLODIPINE BESYLATE 10 MG PO TABS
10.0000 mg | ORAL_TABLET | Freq: Every day | ORAL | Status: DC
  Administered 2016-04-08 – 2016-04-09 (×2): 10 mg via ORAL
  Filled 2016-04-07 (×3): qty 1

## 2016-04-07 MED ORDER — DEXTROSE 5 % IV SOLN
500.0000 mg | Freq: Four times a day (QID) | INTRAVENOUS | Status: DC | PRN
  Filled 2016-04-07: qty 5

## 2016-04-07 SURGICAL SUPPLY — 67 items
BANDAGE ELASTIC 4 VELCRO ST LF (GAUZE/BANDAGES/DRESSINGS) ×2 IMPLANT
BANDAGE ELASTIC 6 VELCRO ST LF (GAUZE/BANDAGES/DRESSINGS) IMPLANT
BANDAGE ESMARK 6X9 LF (GAUZE/BANDAGES/DRESSINGS) ×1 IMPLANT
BLADE SURG 10 STRL SS (BLADE) ×2 IMPLANT
BLADE SURG ROTATE 9660 (MISCELLANEOUS) ×2 IMPLANT
BNDG COHESIVE 6X5 TAN STRL LF (GAUZE/BANDAGES/DRESSINGS) ×2 IMPLANT
BNDG ELASTIC 6X10 VLCR STRL LF (GAUZE/BANDAGES/DRESSINGS) ×2 IMPLANT
BNDG ESMARK 6X9 LF (GAUZE/BANDAGES/DRESSINGS) ×2
CANISTER SUCTION 2500CC (MISCELLANEOUS) IMPLANT
COVER SURGICAL LIGHT HANDLE (MISCELLANEOUS) ×2 IMPLANT
CUFF TOURNIQUET SINGLE 34IN LL (TOURNIQUET CUFF) ×2 IMPLANT
CUFF TOURNIQUET SINGLE 44IN (TOURNIQUET CUFF) IMPLANT
DRAPE C-ARM 42X72 X-RAY (DRAPES) ×2 IMPLANT
DRAPE C-ARMOR (DRAPES) ×2 IMPLANT
DRAPE U-SHAPE 47X51 STRL (DRAPES) ×2 IMPLANT
DRSG ADAPTIC 3X8 NADH LF (GAUZE/BANDAGES/DRESSINGS) IMPLANT
DRSG EMULSION OIL 3X3 NADH (GAUZE/BANDAGES/DRESSINGS) IMPLANT
DRSG PAD ABDOMINAL 8X10 ST (GAUZE/BANDAGES/DRESSINGS) ×4 IMPLANT
ELECT REM PT RETURN 9FT ADLT (ELECTROSURGICAL) ×2
ELECTRODE REM PT RTRN 9FT ADLT (ELECTROSURGICAL) ×1 IMPLANT
GAUZE SPONGE 4X4 12PLY STRL (GAUZE/BANDAGES/DRESSINGS) ×2 IMPLANT
GAUZE XEROFORM 5X9 LF (GAUZE/BANDAGES/DRESSINGS) ×2 IMPLANT
GLOVE BIOGEL PI IND STRL 8 (GLOVE) ×2 IMPLANT
GLOVE BIOGEL PI INDICATOR 8 (GLOVE) ×2
GLOVE ORTHO TXT STRL SZ7.5 (GLOVE) ×4 IMPLANT
GOWN STRL REUS W/ TWL LRG LVL3 (GOWN DISPOSABLE) ×1 IMPLANT
GOWN STRL REUS W/ TWL XL LVL3 (GOWN DISPOSABLE) ×1 IMPLANT
GOWN STRL REUS W/TWL 2XL LVL3 (GOWN DISPOSABLE) ×2 IMPLANT
GOWN STRL REUS W/TWL LRG LVL3 (GOWN DISPOSABLE) ×1
GOWN STRL REUS W/TWL XL LVL3 (GOWN DISPOSABLE) ×1
GUIDEWIRE PIN ORTH 6X1.6XSMTH (WIRE) ×2 IMPLANT
IMMOBILIZER KNEE 24 THIGH 36 (MISCELLANEOUS) ×1 IMPLANT
IMMOBILIZER KNEE 24 UNIV (MISCELLANEOUS) ×2
K-WIRE 1.6 (WIRE) ×2
KIT BASIN OR (CUSTOM PROCEDURE TRAY) ×2 IMPLANT
KIT ROOM TURNOVER OR (KITS) ×2 IMPLANT
MANIFOLD NEPTUNE II (INSTRUMENTS) ×2 IMPLANT
NEEDLE 22X1 1/2 (OR ONLY) (NEEDLE) ×2 IMPLANT
NEEDLE HYPO 25GX1X1/2 BEV (NEEDLE) ×2 IMPLANT
NS IRRIG 1000ML POUR BTL (IV SOLUTION) ×2 IMPLANT
PACK ORTHO EXTREMITY (CUSTOM PROCEDURE TRAY) ×2 IMPLANT
PAD ARMBOARD 7.5X6 YLW CONV (MISCELLANEOUS) ×4 IMPLANT
PAD CAST 4YDX4 CTTN HI CHSV (CAST SUPPLIES) IMPLANT
PADDING CAST COTTON 4X4 STRL (CAST SUPPLIES)
SCREW CANN 4X42 (Screw) ×2 IMPLANT
SCREW CANNULATED 4.0X36 1/2 (Screw) ×2 IMPLANT
SCREW CANNULATED 4.0X38 1/2 (Screw) ×2 IMPLANT
SPONGE LAP 4X18 X RAY DECT (DISPOSABLE) ×2 IMPLANT
STAPLER VISISTAT 35W (STAPLE) ×4 IMPLANT
STOCKINETTE IMPERVIOUS LG (DRAPES) ×2 IMPLANT
SUCTION FRAZIER HANDLE 10FR (MISCELLANEOUS) ×1
SUCTION TUBE FRAZIER 10FR DISP (MISCELLANEOUS) ×1 IMPLANT
SUT FIBERWIRE #2 38 REV NDL BL (SUTURE) ×4
SUT FIBERWIRE #2 38 T-5 BLUE (SUTURE) ×2
SUT VIC AB 0 CT1 27 (SUTURE) ×1
SUT VIC AB 0 CT1 27XBRD ANBCTR (SUTURE) ×1 IMPLANT
SUT VIC AB 2-0 CT1 27 (SUTURE) ×4
SUT VIC AB 2-0 CT1 TAPERPNT 27 (SUTURE) ×4 IMPLANT
SUTURE FIBERWR #2 38 T-5 BLUE (SUTURE) ×1 IMPLANT
SUTURE FIBERWR#2 38 REV NDL BL (SUTURE) ×2 IMPLANT
SYR CONTROL 10ML LL (SYRINGE) ×2 IMPLANT
TOWEL OR 17X24 6PK STRL BLUE (TOWEL DISPOSABLE) ×2 IMPLANT
TOWEL OR 17X26 10 PK STRL BLUE (TOWEL DISPOSABLE) ×2 IMPLANT
TUBE CONNECTING 12X1/4 (SUCTIONS) ×4 IMPLANT
UNDERPAD 30X30 INCONTINENT (UNDERPADS AND DIAPERS) IMPLANT
WATER STERILE IRR 1000ML POUR (IV SOLUTION) IMPLANT
YANKAUER SUCT BULB TIP NO VENT (SUCTIONS) ×2 IMPLANT

## 2016-04-07 NOTE — Transfer of Care (Signed)
Immediate Anesthesia Transfer of Care Note  Patient: [name]  Procedure(s) Performed: Procedure(s): OPEN REDUCTION INTERNAL (ORIF) FIXATION LEFT PATELLA (Left)  Patient Location: PACU  Anesthesia Type:General and Regional  Level of Consciousness: awake, alert  and oriented  Airway & Oxygen Therapy: Patient Spontanous Breathing and Patient connected to nasal cannula oxygen  Post-op Assessment: Report given to RN, Post -op Vital signs reviewed and stable and Patient moving all extremities X 4  Post vital signs: Reviewed and stable  Last Vitals:  Filed Vitals:   04/07/16 1250  BP: 170/66  Pulse: 85  Temp: 36.9 C  Resp: 15    Complications: No apparent anesthesia complications

## 2016-04-07 NOTE — Op Note (Signed)
Christopher, Hines NO.:  [id number]  MEDICAL RECORD NO.:  [id number]  LOCATION:  [id number]                        FACILITY:  [facility]  PHYSICIAN:  [name], M.D.    DATE OF BIRTH:  1961/12/08  DATE OF PROCEDURE:  04/07/2016 DATE OF DISCHARGE:                              OPERATIVE REPORT   PREOPERATIVE DIAGNOSIS:  Left patella fracture.  POSTOPERATIVE DIAGNOSIS:  Left patella fracture.  PROCEDURE:  Open reduction and internal fixation of left patella.  SURGEON:  [name], M.D. ASSISTANT: Benjiman Core PA-C medically necessary and present for the entire procedure.  ANESTHESIA:  General with preoperative femoral nerve block.  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  He had several falls, had been doing well, walking, normal activity and had been kneeling, doing some tile work and when suddenly as he stood up, he slipped on the floor and felt sharp pain in his patella, was unable to walk.  He presented with hemarthrosis and x-rays demonstrated a transverse patellar fracture separating the inferior one-third from the superior one-third.  The rest of the components were well fixed.  DESCRIPTION OF PROCEDURE:  After induction of general anesthesia, Ancef prophylaxis, standard prepping and draping, impervious stockinette, Coban, extremity sheets and drapes, the leg was wrapped in Esmarch. After time-out, tourniquet was inflated.  Sterile skin marker had been used on the old midline incision and Betadine, Steri-Drape had been applied.  Old incision was opened, extended proximally and distally 2 cm.  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  There was acute tear in the lateral retinaculum.  There  was 2 cm of scar tissue, which was thickened with some hemosiderin deposit present throughout the tissue.  Minimal acute hemarthrosis was present. It was difficult to separate scar tissue layer out over the deep retinaculum.  Scalpel blade was turned on the edge and swept back and forth separating the plane.  Proximal and distal fragments were clean, had a little bit of blood clot present, which was minimal.  There was cement present distally and the patellar component was well fixed to the proximal piece and the back surface of the patella and single prong was still present.  After irrigation of the joint, fragments were reduced, held with towel clips, they were adjusted.  It was apparent that due to the thick leg, scar tissue present and small one-third fragment piece of the patella distally would be easier to pin it by going antegrade off the distal fragment and then retrograde passing the K-wire for the cannulated drill back across.  Because one-half of the patella had been resected with the original procedure, there was only 10-11 mm left of the patella.  Pins were drilled with some difficulty finally parallel, spaced 2 cm apart, and then drilled across the proximal fragment checked intermittently with fluoroscopy.  Screws were measured at 38 mm, one- half threads cancellous stainless steel Zimmer screws were selected, drilled out  to the far cortex and then figure-of-eight 20-gauge wire was placed.  With the extremely thick scar tissue, fibrous tissue over the top, I was felt better to leave this scar tissue down and even though maximum tension was applied using the Cobb angle as a fulcrum, I felt it best to leave that tissue there over the top of the patella for its potential healing with operative vascularity that was present.  This was tightened down and a square knot was tied.  The knee was flexed to 60 degrees and stayed together.  An 18-gauge wire was then passed through the  quad tendon over the top of the patella and then a drill hole was placed through the anterior cortex checking under fluoroscopy, making sure it was distal to the tibial prosthesis and running a loop of wire, tensioning it to pull the quad tendon down to take tension off the repair.  A #2 FiberWire was then used for figure-of-eight sutures repairing the retinaculum laterally as well as medially where it torn about 2 cm prior to the sling to rest, the medial retinaculum was still intact or if it had some small amount of tear that had healed.  There were no palpable gaps in it.  A total of six #2 FiberWire sutures were placed for repair.  A 2-0 Vicryl in the subcutaneous tissue after irrigation, skin staple closure.  The knot for the 18-gauge wire holding down the patellas in a position, which was about 1-inch below the joint and was palpable through the subcutaneous tissue and the hole for the wire distally was 2.2 cm above the inferior aspect of the skin incision. Final spot pictures were taken for documentation of the reduction.  The patient tolerated the procedure well, was transferred to the recovery room in stable condition.     [name], M.D.     MCY/MEDQ  D:  04/07/2016  T:  04/07/2016  Job:  [id number]

## 2016-04-07 NOTE — Anesthesia Preprocedure Evaluation (Addendum)
Anesthesia Evaluation  Patient identified by MRN, date of birth, ID band Patient awake    Reviewed: Allergy & Precautions, NPO status , Patient's Chart, lab work & pertinent test results  History of Anesthesia Complications Negative for: history of anesthetic complications  Airway Mallampati: I   Neck ROM: Full    Dental  (+) Dental Advisory Given   Pulmonary neg pulmonary ROS,    breath sounds clear to auscultation + decreased breath sounds      Cardiovascular hypertension, Normal cardiovascular exam Rhythm:Regular Rate:Normal     Neuro/Psych negative neurological ROS     GI/Hepatic   Endo/Other  diabetes, Type 2, Oral Hypoglycemic AgentsMorbid obesity  Renal/GU      Musculoskeletal  (+) Arthritis ,   Abdominal (+) + obese,   Peds  Hematology   Anesthesia Other Findings   Reproductive/Obstetrics                            Anesthesia Physical Anesthesia Plan  ASA: III  Anesthesia Plan: General   Post-op Pain Management:    Induction: Intravenous  Airway Management Planned: Oral ETT  Additional Equipment:   Intra-op Plan:   Post-operative Plan: Extubation in OR  Informed Consent: I have reviewed the patients History and Physical, chart, labs and discussed the procedure including the risks, benefits and alternatives for the proposed anesthesia with the patient or authorized representative who has indicated his/her understanding and acceptance.   Dental advisory given  Plan Discussed with: CRNA, Anesthesiologist and Surgeon  Anesthesia Plan Comments:         Anesthesia Quick Evaluation

## 2016-04-07 NOTE — Interval H&P Note (Signed)
History and Physical Interval Note:  04/07/2016 1:25 PM  [name]  has presented today for surgery, with the diagnosis of left patella fracture  The various methods of treatment have been discussed with the patient and family. After consideration of risks, benefits and other options for treatment, the patient has consented to  Procedure(s): OPEN REDUCTION INTERNAL (ORIF) FIXATION LEFT PATELLA (Left) as a surgical intervention .  The patient's history has been reviewed, patient examined, no change in status, stable for surgery.  I have reviewed the patient's chart and labs.  Questions were answered to the patient's satisfaction.     [name]

## 2016-04-07 NOTE — H&P (Signed)
[name] is an 55 y.o. male.    Patient returns.  He has been back at work after his total knee arthroplasty on July 13, 2015.  He states the left knee had been doing great.  In fact, he was doing a bunch of flooring.  When he got up while he was flooring, suddenly he felt sharp pain when he twisted and suddenly his knee gave way and caused him to fall.  Since that time, he has had extreme difficulty walking, using a walker.  He has erythema and puffiness.  He has had recent toe surgery by a podiatrist in December, which was for a hammer toe and states that it was doing okay.  He presents today for swelling in his knee, primarily prepatellar bursa, palpable gout.  X-ray demonstrates a transverse fracture of the patella.  He has a 40-degree extension lag.     CURRENT MEDICATIONS:  Ibuprofen he had been using, lisinopril 40 mg daily, glipizide 10 mg daily, metformin 1000 mg, 2 per day, troglitazone 45 mg, 1 a day, amlodipine 10 mg daily.    ALLERGIES:  None.     PAST SURGICAL HISTORY:  Remote ACL reconstruction, total knee arthroplasty in July 2016, gallbladder surgery in 2008, T-lift procedure in 2009.    FAMILY HISTORY:  Positive for diabetes, cancer, hypertension.    SOCIAL HISTORY:  Married to his wife, Margaretha Sheffield, who is with him.  He does some painting and spray work.     REVIEW OF SYSTEMS:  Positive for diabetes, hypertension, history of pneumonia, T-lift procedure, total knee arthroplasty on the left.  He is normally followed by Dr. [name].     *  Past Medical History  Diagnosis Date  . Hypertension   . Diabetes mellitus without complication ([place])   . Wears glasses   . Arthritis   . 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 Foot drop, right     Past Surgical History  Procedure Laterality Date  . Knee arthroscopy Left 96  . Cholecystectomy  10  . Back surgery  09  . Eye surgery Bilateral     strabismus   55 yrs old  . Knee arthroplasty Left 07/13/2015    Procedure:  COMPUTER ASSISTED TOTAL KNEE ARTHROPLASTY LEFT, REMOVE ANTERIOR CRUCIATE LIGAMENT SCREWS AND STAPLES;  Surgeon: Marybelle Killings, MD;  Location: [facility];  Service: Orthopedics;  Laterality: Left;  . Hammer toe surgery      left  . Colonoscopy w/ biopsies and polypectomy    . Multiple tooth extractions      Family History  Problem Relation Age of Onset  . Hypertension Mother   . Breast cancer Mother   . Hypertension Father   . Diabetes Father   . Cancer - Lung Father   . Diabetes Sister   . Diabetes Other    Social History:  reports that he has never smoked. He has never used smokeless tobacco. He reports that he drinks alcohol. He reports that he does not use illicit drugs.  Allergies: No Known Allergies  No prescriptions prior to admission    No results found for this or any previous visit (from the past 48 hour(s)). No results found.  Review of Systems  Constitutional: Negative.   HENT: Negative.   Eyes: Negative.   Respiratory: Negative.   Cardiovascular: Negative.   Gastrointestinal: Negative.   Genitourinary: Negative.   Musculoskeletal: Positive for joint pain.  Skin: Negative.   Neurological: Negative.  Psychiatric/Behavioral: Negative.     There were no vitals taken for this visit. Physical Exam  Constitutional: He is oriented to person, place, and time. No distress.  HENT:  Head: Atraumatic.  Eyes: EOM are normal.  Neck: Normal range of motion.  Cardiovascular: Normal rate.   Respiratory: No respiratory distress.  GI: He exhibits no distension.  Neurological: He is alert and oriented to person, place, and time.  Skin: Skin is warm and dry.  Psychiatric: He has a normal mood and affect.    PHYSICAL EXAMINATION:  Patient is alert and oriented.  WD.  WN.  NAD.  Extraocular movements intact.  Lungs are clear.  Heart regular rate and rhythm.  Lumbar incision is well-healed.  Midline incision, left total knee arthroplasty, shows some erythema, prepatellar bursa,  30-40 degree extension lag.  Palpable defect, midportion.    RADIOGRAPHS:  X-rays demonstrate transverse fracture.  Appears the poly is attached to the cephalad piece.  The fracture is between the middle of the patella to possibly the inferior one-third of the patella transverse.     PLAN:  ORIF tension banding.  I have discussed with him options including ORIF of the patella.  He has some mild swelling of both lower extremities.  He understands that he may need additional placed through the tibia to help hold the tension off the repair.  Possibility of wiring with K-wires if there is not enough room for the cannulated screws.  He has a large muscular leg.  He understands he will have the old incision completely opened for further repair to access to the patella.  He understands that he will be out of work for a couple of months.  Work slip given.  Procedure discussed.  Risks of infection, reoperation, potential for it to break later, failure of fixation, pull out of the screws all discussed.    Lanae Crumbly, PA-C 04/07/2016, 7:19 AM

## 2016-04-07 NOTE — Anesthesia Procedure Notes (Addendum)
Anesthesia Regional Block:  Femoral nerve block  Pre-Anesthetic Checklist: ,, timeout performed, Correct Patient, Correct Site, Correct Laterality, Correct Procedure, Correct Position, site marked, Risks and benefits discussed,  Surgical consent,  Pre-op evaluation,  At surgeon's request and post-op pain management  Laterality: Left and Lower  Prep: chloraprep and Dura Prep       Needles:  Injection technique: Single-shot  Needle Type: Echogenic Stimulator Needle     Needle Length: 9cm 9 cm Needle Gauge: 21 and 21 G  Needle insertion depth: 6 cm   Additional Needles:  Procedures: ultrasound guided (picture in chart) and nerve stimulator Femoral nerve block  Nerve Stimulator or Paresthesia:  Response: Twitch elicited, 0.8 mA,   Additional Responses:   Narrative:  Start time: 04/07/2016 1:45 PM End time: 04/07/2016 2:00 PM Injection made incrementally with aspirations every 5 mL.  Performed by: Personally  Anesthesiologist: MASSAGEE, TERRY  Additional Notes: BP cuff, EKG monitors applied. Sedation begun. Femoral artery palpated for location of nerve. After nerve location anesthetic injected incrementally, slowly , and after neg aspirations. Tolerated well.   Procedure Name: Intubation Date/Time: 04/07/2016 2:46 PM Performed by: Rebekah Chesterfield L Pre-anesthesia Checklist: Patient identified, Emergency Drugs available, Suction available and Patient being monitored Patient Re-evaluated:Patient Re-evaluated prior to inductionOxygen Delivery Method: Circle System Utilized Preoxygenation: Pre-oxygenation with 100% oxygen Intubation Type: IV induction and Cricoid Pressure applied Ventilation: Mask ventilation with difficulty and Oral airway inserted - appropriate to patient size Laryngoscope Size: Mac and 4 Tube type: Oral Tube size: 8.0 mm Number of attempts: 1 Airway Equipment and Method: Stylet Placement Confirmation: ETT inserted through vocal cords under direct  vision,  positive ETCO2 and breath sounds checked- equal and bilateral Secured at: 23 cm Tube secured with: Tape Dental Injury: Teeth and Oropharynx as per pre-operative assessment

## 2016-04-07 NOTE — Brief Op Note (Signed)
04/07/2016  4:50 PM  PATIENT:  [name]  55 y.o. male  PRE-OPERATIVE DIAGNOSIS:  left patella fracture  POST-OPERATIVE DIAGNOSIS:  left patella fracture  PROCEDURE:  Procedure(s): OPEN REDUCTION INTERNAL (ORIF) FIXATION LEFT PATELLA (Left)  SURGEON:  Surgeon(s) and Role:    * Marybelle Killings, MD - Primary  PHYSICIAN ASSISTANT: Benjiman Core PA-C  ANESTHESIA:   general  EBL:  Total I/O In: 1750 [I.V.:1750] Out: 100 [Blood:100]  BLOOD ADMINISTERED:none  DRAINS: none   LOCAL MEDICATIONS USED:  MARCAINE     SPECIMEN:  No Specimen  DISPOSITION OF SPECIMEN:  N/A  COUNTS:  YES  TOURNIQUET:   Total Tourniquet Time Documented: Thigh (Left) - 83 minutes Total: Thigh (Left) - 83 minutes   DICTATION: .Viviann Spare Dictation  PLAN OF CARE: Admit to inpatient   PATIENT DISPOSITION:  PACU - hemodynamically stable.

## 2016-04-08 ENCOUNTER — Encounter (HOSPITAL_COMMUNITY): Payer: Self-pay | Admitting: Orthopaedic Surgery

## 2016-04-08 LAB — GLUCOSE, CAPILLARY
GLUCOSE-CAPILLARY: 186 mg/dL — AB (ref 65–99)
Glucose-Capillary: 140 mg/dL — ABNORMAL HIGH (ref 65–99)
Glucose-Capillary: 169 mg/dL — ABNORMAL HIGH (ref 65–99)

## 2016-04-08 LAB — BASIC METABOLIC PANEL
Anion gap: 10 (ref 5–15)
BUN: 9 mg/dL (ref 6–20)
CHLORIDE: 100 mmol/L — AB (ref 101–111)
CO2: 25 mmol/L (ref 22–32)
Calcium: 8.5 mg/dL — ABNORMAL LOW (ref 8.9–10.3)
Creatinine, Ser: 1.01 mg/dL (ref 0.61–1.24)
GFR calc Af Amer: >60 mL/min (ref >60)
GFR calc non Af Amer: >60 mL/min (ref >60)
GLUCOSE: 194 mg/dL — AB (ref 65–99)
Potassium: 3.7 mmol/L (ref 3.5–5.1)
Sodium: 135 mmol/L (ref 135–145)

## 2016-04-08 NOTE — Progress Notes (Signed)
Inpatient Diabetes Program Recommendations  AACE/ADA: New Consensus Statement on Inpatient Glycemic Control (2015)  Target Ranges:  Prepandial:   less than 140 mg/dL      Peak postprandial:   less than 180 mg/dL (1-2 hours)      Critically ill patients:  140 - 180 mg/dL   Review of Glycemic Control  Inpatient Diabetes Program Recommendations:  Correction (SSI): consider adding Novolog sensitive scale per Glycemic Control order-set Thank you  Raoul Pitch BSN, RN,CDE Inpatient Diabetes Coordinator [phone] (team pager)

## 2016-04-08 NOTE — Progress Notes (Signed)
Subjective: 1 Day Post-Op Procedure(s) (LRB): OPEN REDUCTION INTERNAL (ORIF) FIXATION LEFT PATELLA (Left) Patient reports pain as moderate.    Objective: Vital signs in last 24 hours: Temp:  [97.5 F (36.4 C)-99.4 F (37.4 C)] 99.4 F (37.4 C) (04/25 0513) Pulse Rate:  [70-102] 70 (04/25 0513) Resp:  [12-17] 17 (04/25 0513) BP: (129-189)/(66-104) 134/68 mmHg (04/25 0522) SpO2:  [92 %-100 %] 95 % (04/25 0513) Weight:  [143.337 kg (316 lb)] 143.337 kg (316 lb) (04/24 1250)  Intake/Output from previous day: 04/24 0701 - 04/25 0700 In: 1750 [I.V.:1750] Out: 100 [Blood:100] Intake/Output this shift:    No results for input(s): HGB in the last 72 hours. No results for input(s): WBC, RBC, HCT, PLT in the last 72 hours.  Recent Labs  04/07/16 1249 04/08/16 0419  NA 136 135  K 4.1 3.7  CL 102 100*  CO2 24 25  BUN 11 9  CREATININE 0.84 1.01  GLUCOSE 226* 194*  CALCIUM 9.6 8.5*    Recent Labs  04/07/16 1249  INR 1.07    Neurologically intact  Assessment/Plan: 1 Day Post-Op Procedure(s) (LRB): OPEN REDUCTION INTERNAL (ORIF) FIXATION LEFT PATELLA (Left) Up with therapy likely home tomorrow.   His fracture looked like it was several months old. Hx of several falls. Likely fractured patella and had been walking and working on it until he was laying a lot of tile on his knee and then when he got up he slipped.  Will keep today and make sure he is safe with PT with Hx of falls after TKA last summer.   [name] C 04/08/2016, 7:41 AM

## 2016-04-08 NOTE — Progress Notes (Signed)
Utilization review completed.  

## 2016-04-08 NOTE — Anesthesia Postprocedure Evaluation (Signed)
Anesthesia Post Note  Patient: [name]  Procedure(s) Performed: Procedure(s) (LRB): OPEN REDUCTION INTERNAL (ORIF) FIXATION LEFT PATELLA (Left)  Patient location during evaluation: PACU Anesthesia Type: General and Spinal Level of consciousness: awake and awake and alert Vital Signs Assessment: post-procedure vital signs reviewed and stable Respiratory status: spontaneous breathing, nonlabored ventilation and respiratory function stable Cardiovascular status: blood pressure returned to baseline Postop Assessment: no headache and spinal receding Anesthetic complications: no    Last Vitals:  Filed Vitals:   04/08/16 1500 04/08/16 2044  BP: 129/65 158/78  Pulse: 84 90  Temp: 37.7 C 37.6 C  Resp: 18 18    Last Pain:  Filed Vitals:   04/08/16 2049  PainSc: 2                  Kaydon Husby COKER

## 2016-04-08 NOTE — Evaluation (Signed)
Physical Therapy Evaluation Patient Details Name: [name] MRN: [id number] DOB: 1961-06-21 Today's Date: 04/08/2016   History of Present Illness  Pt is a 55 y.o. male who was admitted with a Lt patellar fracture. Pt is now s/p ORIF of the Lt patella on 04/07/16. 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  Clinical Impression  Patient is s/p above surgery resulting in functional limitations due to the deficits listed below (see PT Problem List).  Patient will benefit from skilled PT to increase their independence and safety with mobility to allow discharge to home with family assistance. Able to ambulate 12 feet with rw during initial session. Will progress as tolerated.     Follow Up Recommendations No PT follow up;Supervision for mobility/OOB    Equipment Recommendations  None recommended by PT    Recommendations for Other Services       Precautions / Restrictions Precautions Precautions: Fall Required Braces or Orthoses: Knee Immobilizer - Left Knee Immobilizer - Left: On at all times Restrictions Weight Bearing Restrictions: Yes LLE Weight Bearing: Weight bearing as tolerated      Mobility  Bed Mobility Overal bed mobility: Needs Assistance Bed Mobility: Supine to Sit     Supine to sit: Min assist;HOB elevated     General bed mobility comments: assist provided with LLE, pt using bed rail to assist to sitting.   Transfers Overall transfer level: Needs assistance Equipment used: Rolling walker (2 wheeled) Transfers: Sit to/from Stand Sit to Stand: Min guard         General transfer comment: Pt needing extra time to get to standing. Initially describes mild dizziness with standing but improving with time.   Ambulation/Gait Ambulation/Gait assistance: Min guard Ambulation Distance (Feet): 12 Feet Assistive device: Rolling walker (2 wheeled) Gait Pattern/deviations: Step-to pattern;Decreased weight shift to left Gait  velocity: decreased   General Gait Details: Pt is able to bear partial weight throught LLE, reminded of WBAT status. Returning to sitting due to fatigue.   Stairs            Wheelchair Mobility    Modified Rankin (Stroke Patients Only)       Balance Overall balance assessment: Needs assistance Sitting-balance support: No upper extremity supported Sitting balance-Leahy Scale: Good     Standing balance support: No upper extremity supported Standing balance-Leahy Scale: Poor Standing balance comment: using rw for support.                              Pertinent Vitals/Pain Pain Assessment: 0-10 Pain Score: 3  Pain Location: Lt knee Pain Descriptors / Indicators: Sore Pain Intervention(s): Monitored during session    Home Living Family/patient expects to be discharged to:: Private residence Living Arrangements: Spouse/significant other Available Help at Discharge: Family;Available 24 hours/day Type of Home: House Home Access: Stairs to enter Entrance Stairs-Rails: Can reach both Entrance Stairs-Number of Steps: 4 Home Layout: One level Home Equipment: Walker - 2 wheels;Wheelchair - manual      Prior Function Level of Independence: Independent               Hand Dominance        Extremity/Trunk Assessment   Upper Extremity Assessment: Defer to OT evaluation           Lower Extremity Assessment: LLE deficits/detail   LLE Deficits / Details: requiring mod assist to move LE to edge of bed.   Cervical /  Trunk Assessment: Normal  Communication   Communication: No difficulties  Cognition Arousal/Alertness: Awake/alert Behavior During Therapy: WFL for tasks assessed/performed Overall Cognitive Status: Within Functional Limits for tasks assessed                      General Comments      Exercises        Assessment/Plan    PT Assessment Patient needs continued PT services  PT Diagnosis Difficulty walking   PT Problem  List Decreased strength;Decreased range of motion;Decreased activity tolerance;Decreased balance;Decreased mobility  PT Treatment Interventions DME instruction;Gait training;Stair training;Functional mobility training;Therapeutic activities;Therapeutic exercise;Patient/family education   PT Goals (Current goals can be found in the Care Plan section) Acute Rehab PT Goals Patient Stated Goal: go back home from the hospital PT Goal Formulation: With patient Time For Goal Achievement: 04/22/16 Potential to Achieve Goals: Good    Frequency Min 5X/week   Barriers to discharge        Co-evaluation               End of Session Equipment Utilized During Treatment: Gait belt;Left knee immobilizer Activity Tolerance: Patient tolerated treatment well;Patient limited by fatigue Patient left: in chair;with call bell/phone within reach Nurse Communication: Mobility status;Weight bearing status         Time: [id number] PT Time Calculation (min) (ACUTE ONLY): 28 min   Charges:   PT Evaluation $PT Eval Moderate Complexity: 1 Procedure PT Treatments $Gait Training: 8-22 mins   PT G Codes:        Cassell Clement, PT, CSCS Pager [phone] Office [phone]  04/08/2016, 11:42 AM

## 2016-04-09 LAB — BASIC METABOLIC PANEL
Anion gap: 10 (ref 5–15)
BUN: 8 mg/dL (ref 6–20)
CHLORIDE: 100 mmol/L — AB (ref 101–111)
CO2: 28 mmol/L (ref 22–32)
CREATININE: 1.24 mg/dL (ref 0.61–1.24)
Calcium: 8.9 mg/dL (ref 8.9–10.3)
GFR calc Af Amer: >60 mL/min (ref >60)
GFR calc non Af Amer: >60 mL/min (ref >60)
Glucose, Bld: 119 mg/dL — ABNORMAL HIGH (ref 65–99)
POTASSIUM: 3.9 mmol/L (ref 3.5–5.1)
Sodium: 138 mmol/L (ref 135–145)

## 2016-04-09 MED ORDER — OXYCODONE-ACETAMINOPHEN 7.5-325 MG PO TABS: 1.0000 | ORAL_TABLET | Freq: Four times a day (QID) | ORAL | Status: DC | PRN

## 2016-04-09 MED ORDER — METHOCARBAMOL 500 MG PO TABS: 500.0000 mg | ORAL_TABLET | Freq: Four times a day (QID) | ORAL | Status: AC | PRN

## 2016-04-09 MED ORDER — ASPIRIN 325 MG PO TABS: 325.0000 mg | ORAL_TABLET | Freq: Every day | ORAL | Status: AC

## 2016-04-09 NOTE — Care Management Note (Signed)
Case Management Note  Patient Details  Name: CHAYTEN HOVANEC MRN: [id number] Date of Birth: 05/13/1961  Subjective/Objective:           S/p left patella ORIF         Action/Plan: No PT/OT follow up recommended, no equipment needs identified. Patient to discharge home with family.  Expected Discharge Date:                  Expected Discharge Plan:  Home/Self Care  In-House Referral:  NA  Discharge planning Services  CM Consult  Post Acute Care Choice:  NA Choice offered to:     DME Arranged:  N/A DME Agency:     HH Arranged:  NA HH Agency:     Status of Service:  Completed, signed off  Medicare Important Message Given:    Date Medicare IM Given:    Medicare IM give by:    Date Additional Medicare IM Given:    Additional Medicare Important Message give by:     If discussed at [place] of Stay Meetings, dates discussed:    Additional Comments:  Nila Nephew, RN 04/09/2016, 11:59 AM

## 2016-04-09 NOTE — Evaluation (Signed)
Occupational Therapy Evaluation Patient Details Name: [name] MRN: [id number] DOB: May 14, 1961 Today's Date: 04/09/2016    History of Present Illness Pt is a 55 y.o. male who was admitted with a Lt patellar fracture. Pt is now s/p ORIF of the Lt patella on 04/07/16. 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   Clinical Impression   Pt s/p above. Education provided in session and wife available to assist at home. OT signing off.    Follow Up Recommendations  No OT follow up;Supervision - Intermittent    Equipment Recommendations  None recommended by OT    Recommendations for Other Services       Precautions / Restrictions Precautions Precautions: Fall Required Braces or Orthoses: Knee Immobilizer - Left Knee Immobilizer - Left: On at all times Restrictions Weight Bearing Restrictions: Yes LLE Weight Bearing: Weight bearing as tolerated      Mobility Bed Mobility               General bed mobility comments: not assessed  Transfers Overall transfer level: Needs assistance Equipment used: Rolling walker (2 wheeled) Transfers: Sit to/from Stand Sit to Stand: Mod assist         General transfer comment: used rocking technique to stand.  Pt reports he has brace for right foot due to foot drop.Pt reports he has brace for right foot due to foot drop.     Balance    Used RW for sit to stand transfer and for ambulation.                    ADL Overall ADL's : Needs assistance/impaired     Grooming: Set up;Sitting               Lower Body Dressing: Moderate assistance;Sit to/from stand;With adaptive equipment   Toilet Transfer: Moderate assistance;Ambulation;RW (sit to stand from chair-Mod A; Min guard for ambulation)       Tub/ Shower Transfer: Walk-in shower;Min guard;Ambulation;Rolling walker   Functional mobility during ADLs: Rolling walker (Mod A-sit to stand; Min guard for  ambulation) General ADL Comments: Educated on shower transfer technique and pt practiced simulated shower transfer. Educated on safety such as rugs/items on floor, safe footwear, using bag on walker. Educated on LB dressing technique.  Educated on AE.     Vision     Perception     Praxis      Pertinent Vitals/Pain Pain Assessment: 0-10 Pain Score: 5  Pain Location: Lt LE Pain Descriptors / Indicators: Sore Pain Intervention(s): Monitored during session     Hand Dominance     Extremity/Trunk Assessment Upper Extremity Assessment Upper Extremity Assessment: Overall WFL for tasks assessed   Lower Extremity Assessment Lower Extremity Assessment: Defer to PT evaluation       Communication Communication Communication: No difficulties   Cognition Arousal/Alertness: Awake/alert Behavior During Therapy: WFL for tasks assessed/performed Overall Cognitive Status: Within Functional Limits for tasks assessed                     General Comments       Exercises       Shoulder Instructions      Home Living Family/patient expects to be discharged to:: Private residence Living Arrangements: Spouse/significant other Available Help at Discharge: Family;Available 24 hours/day Type of Home: House Home Access: Stairs to enter [employer] of Steps: 4 Entrance Stairs-Rails: Can reach both Home Layout: One level  Bathroom Shower/Tub: Gaffer;Door         Home Equipment: Walker - 2 wheels;Shower seat - built in;Bedside commode;Other (comment);Wheelchair - Psychologist, educational (lift chair) Adaptive Equipment: Sock aid;Reacher;Long-handled shoe horn;Long-handled sponge        Prior Functioning/Environment Level of Independence: Independent             OT Diagnosis: Acute pain   OT Problem List: Decreased strength;Decreased range of motion;Decreased activity tolerance;Obesity;Pain   OT Treatment/Interventions:      OT Goals(Current  goals can be found in the care plan section) Acute Rehab OT Goals Patient Stated Goal: go home  OT Frequency:     Barriers to D/C:            Co-evaluation              End of Session Equipment Utilized During Treatment: Gait belt;Rolling walker;Other (comment) (AE)  Activity Tolerance: Patient tolerated treatment well Patient left: in chair;with call bell/phone within reach   Time: 1031-1052 OT Time Calculation (min): 21 min Charges:  OT General Charges $OT Visit: 1 Procedure OT Evaluation $OT Eval Moderate Complexity: 1 Procedure G-CodesBenito Mccreedy OTR/L [id number] 04/09/2016, 11:54 AM

## 2016-04-09 NOTE — Progress Notes (Signed)
Pt discharge education and instructions completed with pt and spouse at bedside; both voices understanding and denies any questions. Pt IV removed; pt discharge home with spouse to transport him home. Pt handed his prescriptions for Percocet; robaxin and aspirin. Pt transported off unit via wheelchair with spouse and belongings to the side. Pt Knee immobilizer remained intact to LLE. Delia Heady RN

## 2016-04-09 NOTE — Progress Notes (Signed)
Physical Therapy Treatment Patient Details Name: [name] MRN: [id number] DOB: March 22, 1961 Today's Date: 04/09/2016    History of Present Illness Pt is a 55 y.o. male who was admitted with a Lt patellar fracture. Pt is now s/p ORIF of the Lt patella on 04/07/16. 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    PT Comments    Pt mobilizing slowly but able to increase his ambulation distance. Ambulation limited by fatigue but pt states that he still feels like he will be able to return home. With sit/stand transfers, pt taking multiple attempts but able to finally perform with min guard assist. PT to continue to follow and attempt stairs at next session.   Follow Up Recommendations  No PT follow up;Supervision for mobility/OOB     Equipment Recommendations  None recommended by PT    Recommendations for Other Services       Precautions / Restrictions Precautions Precautions: Fall Required Braces or Orthoses: Knee Immobilizer - Left Knee Immobilizer - Left: On at all times Restrictions Weight Bearing Restrictions: Yes LLE Weight Bearing: Weight bearing as tolerated    Mobility  Bed Mobility               General bed mobility comments: up in chair upon arrival  Transfers Overall transfer level: Needs assistance Equipment used: Rolling walker (2 wheeled) Transfers: Sit to/from Stand Sit to Stand: Min guard         General transfer comment: Pt making multiple attempts to get to standing. Encouraging foot and hand placement for improved technique.   Ambulation/Gait Ambulation/Gait assistance: Min guard Ambulation Distance (Feet): 25 Feet Assistive device: Rolling walker (2 wheeled) Gait Pattern/deviations: Step-to pattern;Trunk flexed;Decreased weight shift to left Gait velocity: decreased   General Gait Details: cues needed for posture throughout, reminders provided for WBAT through LLE. Pt taking 3 standing  breaks.   Stairs            Wheelchair Mobility    Modified Rankin (Stroke Patients Only)       Balance Overall balance assessment: Needs assistance Sitting-balance support: No upper extremity supported Sitting balance-Leahy Scale: Good     Standing balance support: Bilateral upper extremity supported Standing balance-Leahy Scale: Poor Standing balance comment: using rw                    Cognition Arousal/Alertness: Awake/alert Behavior During Therapy: WFL for tasks assessed/performed Overall Cognitive Status: Within Functional Limits for tasks assessed                      Exercises      General Comments        Pertinent Vitals/Pain Pain Assessment: Faces Faces Pain Scale: Hurts a little bit Pain Location: Lt knee Pain Descriptors / Indicators: Sore Pain Intervention(s): Monitored during session    Home Living                      Prior Function            PT Goals (current goals can now be found in the care plan section) Acute Rehab PT Goals Patient Stated Goal: go home PT Goal Formulation: With patient Time For Goal Achievement: 04/22/16 Potential to Achieve Goals: Good Progress towards PT goals: Progressing toward goals    Frequency  Min 5X/week    PT Plan Current plan remains appropriate    Co-evaluation  End of Session Equipment Utilized During Treatment: Gait belt;Left knee immobilizer Activity Tolerance: Patient limited by fatigue Patient left: in chair;with call bell/phone within reach     Time: 0853-0913 PT Time Calculation (min) (ACUTE ONLY): 20 min  Charges:  $Gait Training: 8-22 mins                    G Codes:      Cassell Clement, PT, CSCS Pager [phone] Office [phone]  04/09/2016, 9:19 AM

## 2016-04-09 NOTE — Progress Notes (Signed)
Physical Therapy Treatment Patient Details Name: [name] MRN: [id number] DOB: 1961/11/06 Today's Date: 04/09/2016    History of Present Illness Pt is a 55 y.o. male who was admitted with a Lt patellar fracture. Pt is now s/p ORIF of the Lt patella on 04/07/16. 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    PT Comments    Pt able to demonstrate gradual improvement with ambulation as well as going up/down stairs. Pt needing mod assist with sit/stand from chair level. Discussed current mobility level and assistance recommended with pt and spouse. Both confirm feeling confident with going home today if released. PT to follow until D/C for mobility progression.   Follow Up Recommendations  No PT follow up;Supervision for mobility/OOB     Equipment Recommendations  None recommended by PT    Recommendations for Other Services       Precautions / Restrictions Precautions Precautions: Fall Required Braces or Orthoses: Knee Immobilizer - Left Knee Immobilizer - Left: On at all times Restrictions Weight Bearing Restrictions: Yes LLE Weight Bearing: Weight bearing as tolerated    Mobility  Bed Mobility               General bed mobility comments: up in chair upon arrival  Transfers Overall transfer level: Needs assistance Equipment used: Rolling walker (2 wheeled) Transfers: Sit to/from Stand Sit to Stand: Mod assist         General transfer comment: mod assist to get to standing from chair. Encouraged higher surfaces at home if possible  Ambulation/Gait Ambulation/Gait assistance: Supervision Ambulation Distance (Feet): 30 Feet Assistive device: Rolling walker (2 wheeled) Gait Pattern/deviations: Step-through pattern;Decreased step length - right;Decreased step length - left;Decreased weight shift to left Gait velocity: decreased   General Gait Details: cues for posture, no loss of balance.     Stairs Stairs: Yes Stairs assistance: Min guard Stair Management: Two rails;Step to pattern;Forwards Number of Stairs: 2 General stair comments: Pt reports feeling confident with doing stairs at home. States that he can use the front with 4 steps or back with 2.   Wheelchair Mobility    Modified Rankin (Stroke Patients Only)       Balance Overall balance assessment: Needs assistance Sitting-balance support: No upper extremity supported Sitting balance-Leahy Scale: Good     Standing balance support: Bilateral upper extremity supported Standing balance-Leahy Scale: Poor Standing balance comment: using rw                    Cognition Arousal/Alertness: Awake/alert Behavior During Therapy: WFL for tasks assessed/performed Overall Cognitive Status: Within Functional Limits for tasks assessed                      Exercises      General Comments        Pertinent Vitals/Pain Pain Assessment: Faces Pain Score: 5  Faces Pain Scale: Hurts little more Pain Location: Lt LE Pain Descriptors / Indicators: Sore Pain Intervention(s): Monitored during session;Limited activity within patient's tolerance    Home Living Family/patient expects to be discharged to:: Private residence Living Arrangements: Spouse/significant other Available Help at Discharge: Family;Available 24 hours/day Type of Home: House Home Access: Stairs to enter Entrance Stairs-Rails: Can reach both Home Layout: One level Home Equipment: Walker - 2 wheels;Shower seat - built in;Bedside commode;Other (comment);Wheelchair - Psychologist, educational (lift chair)      Prior Function Level of Independence: Independent  PT Goals (current goals can now be found in the care plan section) Acute Rehab PT Goals Patient Stated Goal: go home PT Goal Formulation: With patient Time For Goal Achievement: 04/22/16 Potential to Achieve Goals: Good Progress towards PT goals: Progressing  toward goals    Frequency  Min 5X/week    PT Plan Current plan remains appropriate    Co-evaluation             End of Session Equipment Utilized During Treatment: Gait belt;Left knee immobilizer Activity Tolerance: Patient tolerated treatment well Patient left: in chair;with call bell/phone within reach;with family/visitor present     Time: [id number] PT Time Calculation (min) (ACUTE ONLY): 19 min  Charges:  $Gait Training: 8-22 mins                    G Codes:      Cassell Clement, PT, CSCS Pager [phone] Office [phone]  04/09/2016, 1:45 PM

## 2016-04-09 NOTE — Progress Notes (Signed)
Subjective: Patient seen this morning.  Doing well.  Pain controlled.  Hoping to d/c home today.  Slow moving with PT yesterday.     Objective: Vital signs in last 24 hours: Temp:  [98.6 F (37 C)-99.8 F (37.7 C)] 98.6 F (37 C) (04/26 0913) Pulse Rate:  [80-90] 80 (04/26 0913) Resp:  [18] 18 (04/26 0913) BP: (122-198)/(52-78) 198/52 mmHg (04/26 0913) SpO2:  [96 %-97 %] 96 % (04/26 0913)  Intake/Output from previous day: 04/25 0701 - 04/26 0700 In: 480 [P.O.:480] Out: 1100 [Urine:1100] Intake/Output this shift:    No results for input(s): HGB in the last 72 hours. No results for input(s): WBC, RBC, HCT, PLT in the last 72 hours.  Recent Labs  04/08/16 0419 04/09/16 0343  NA 135 138  K 3.7 3.9  CL 100* 100*  CO2 25 28  BUN 9 8  CREATININE 1.01 1.24  GLUCOSE 194* 119*  CALCIUM 8.5* 8.9    Recent Labs  04/07/16 1249  INR 1.07    Exam:  Alert and oriented.  Pleasant male.  Dressing c/d/i. Calf nontender NVI.    Assessment/Plan: Will see how patient does with PT today.  D/c home if makes good progress and safe with ambulation.  Knee immobilizer on at all times.  Do not bend knee.  Stressed to patient the importance of being compliant and understands the risk of reinjury  Voices understanding.    Scripts on chart for percocet, robaxin and aspirin.    Neya Creegan M 04/09/2016, 10:36 AM

## 2016-04-13 IMAGING — CR DG CHEST 2V
2 series · 2 of 2 positions shown · non-contrast
Comparison: PA and lateral chest 06/07/2010.

CLINICAL DATA: Preoperative examination.  History of hypertension.

EXAM:
CHEST  2 VIEW

[w chest pa]
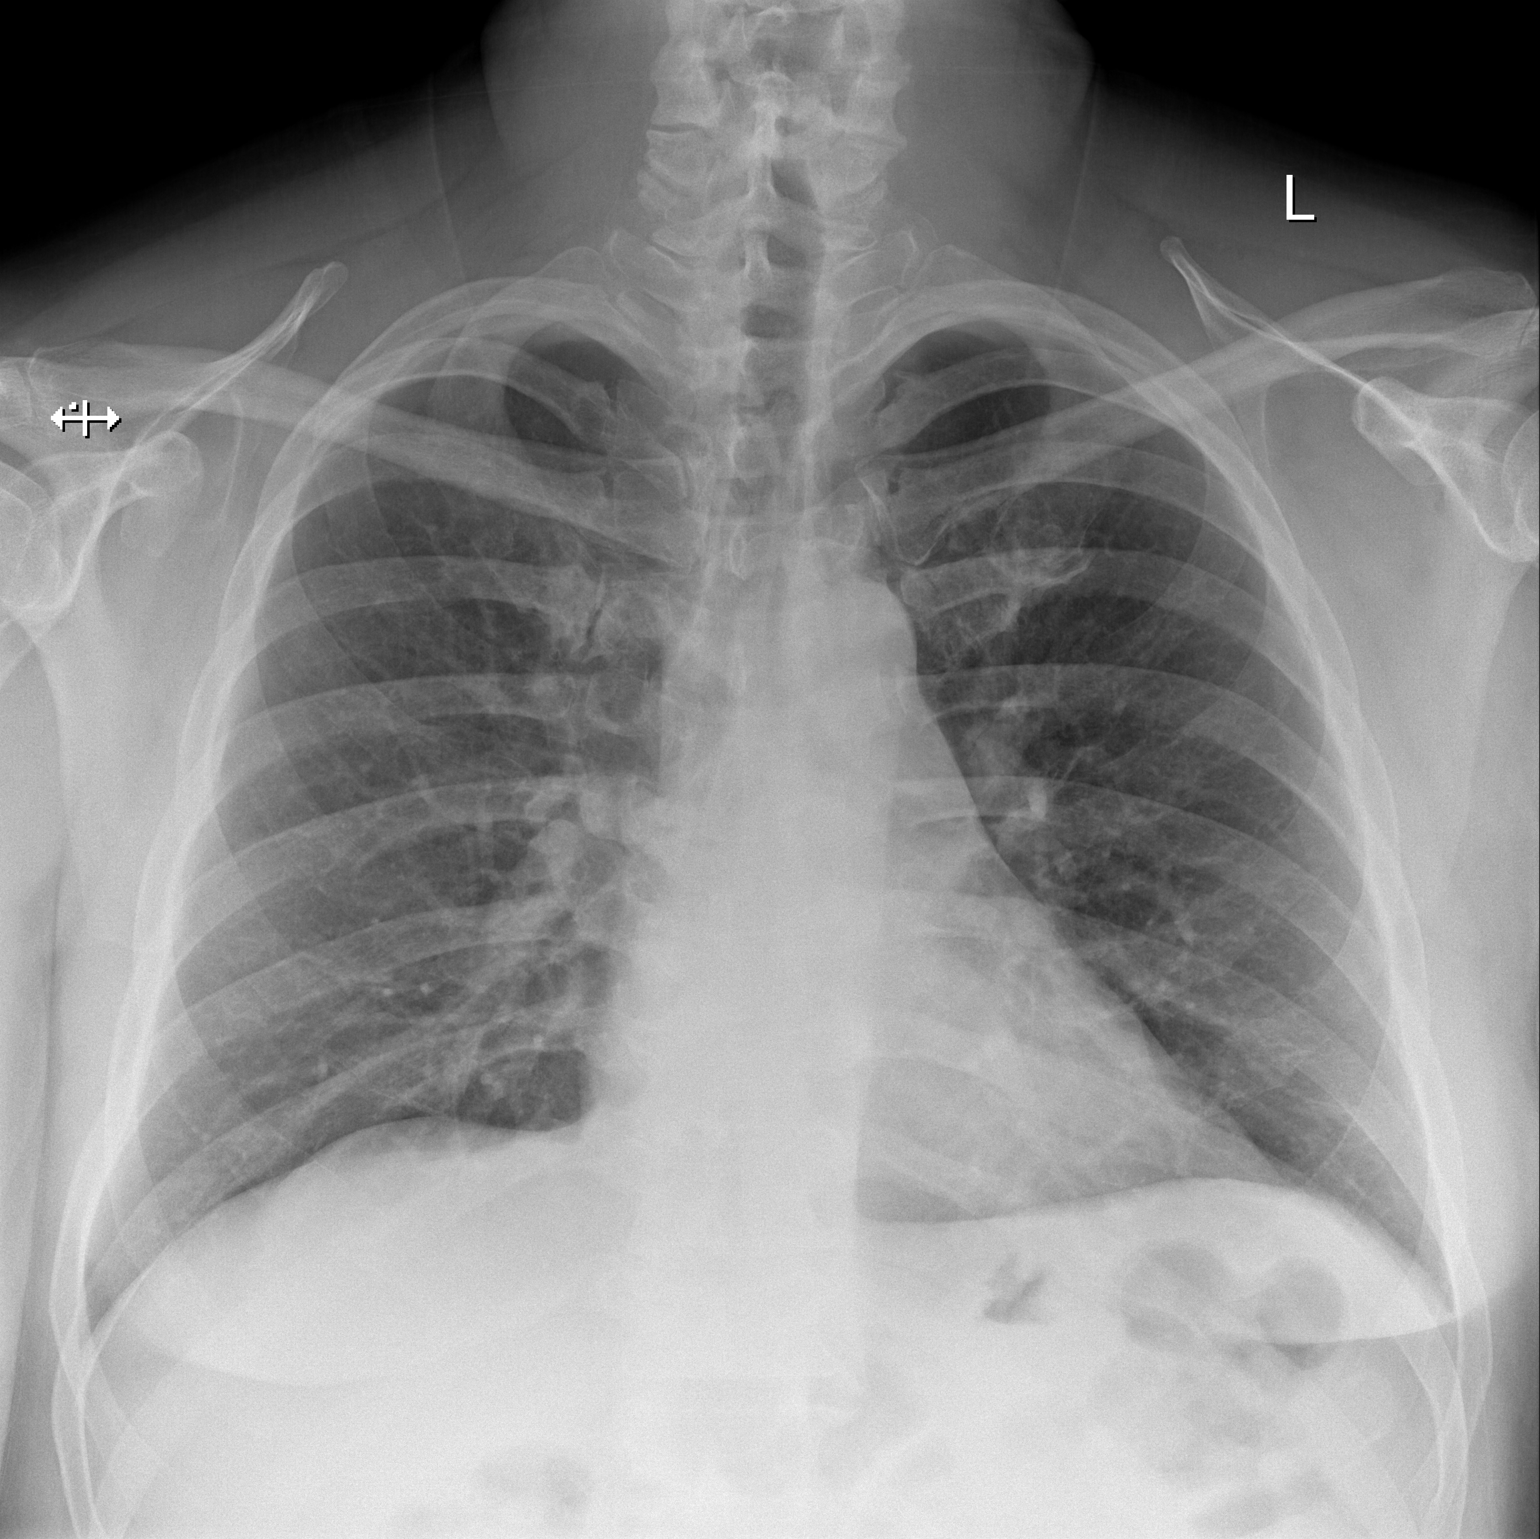

[w chest lat]
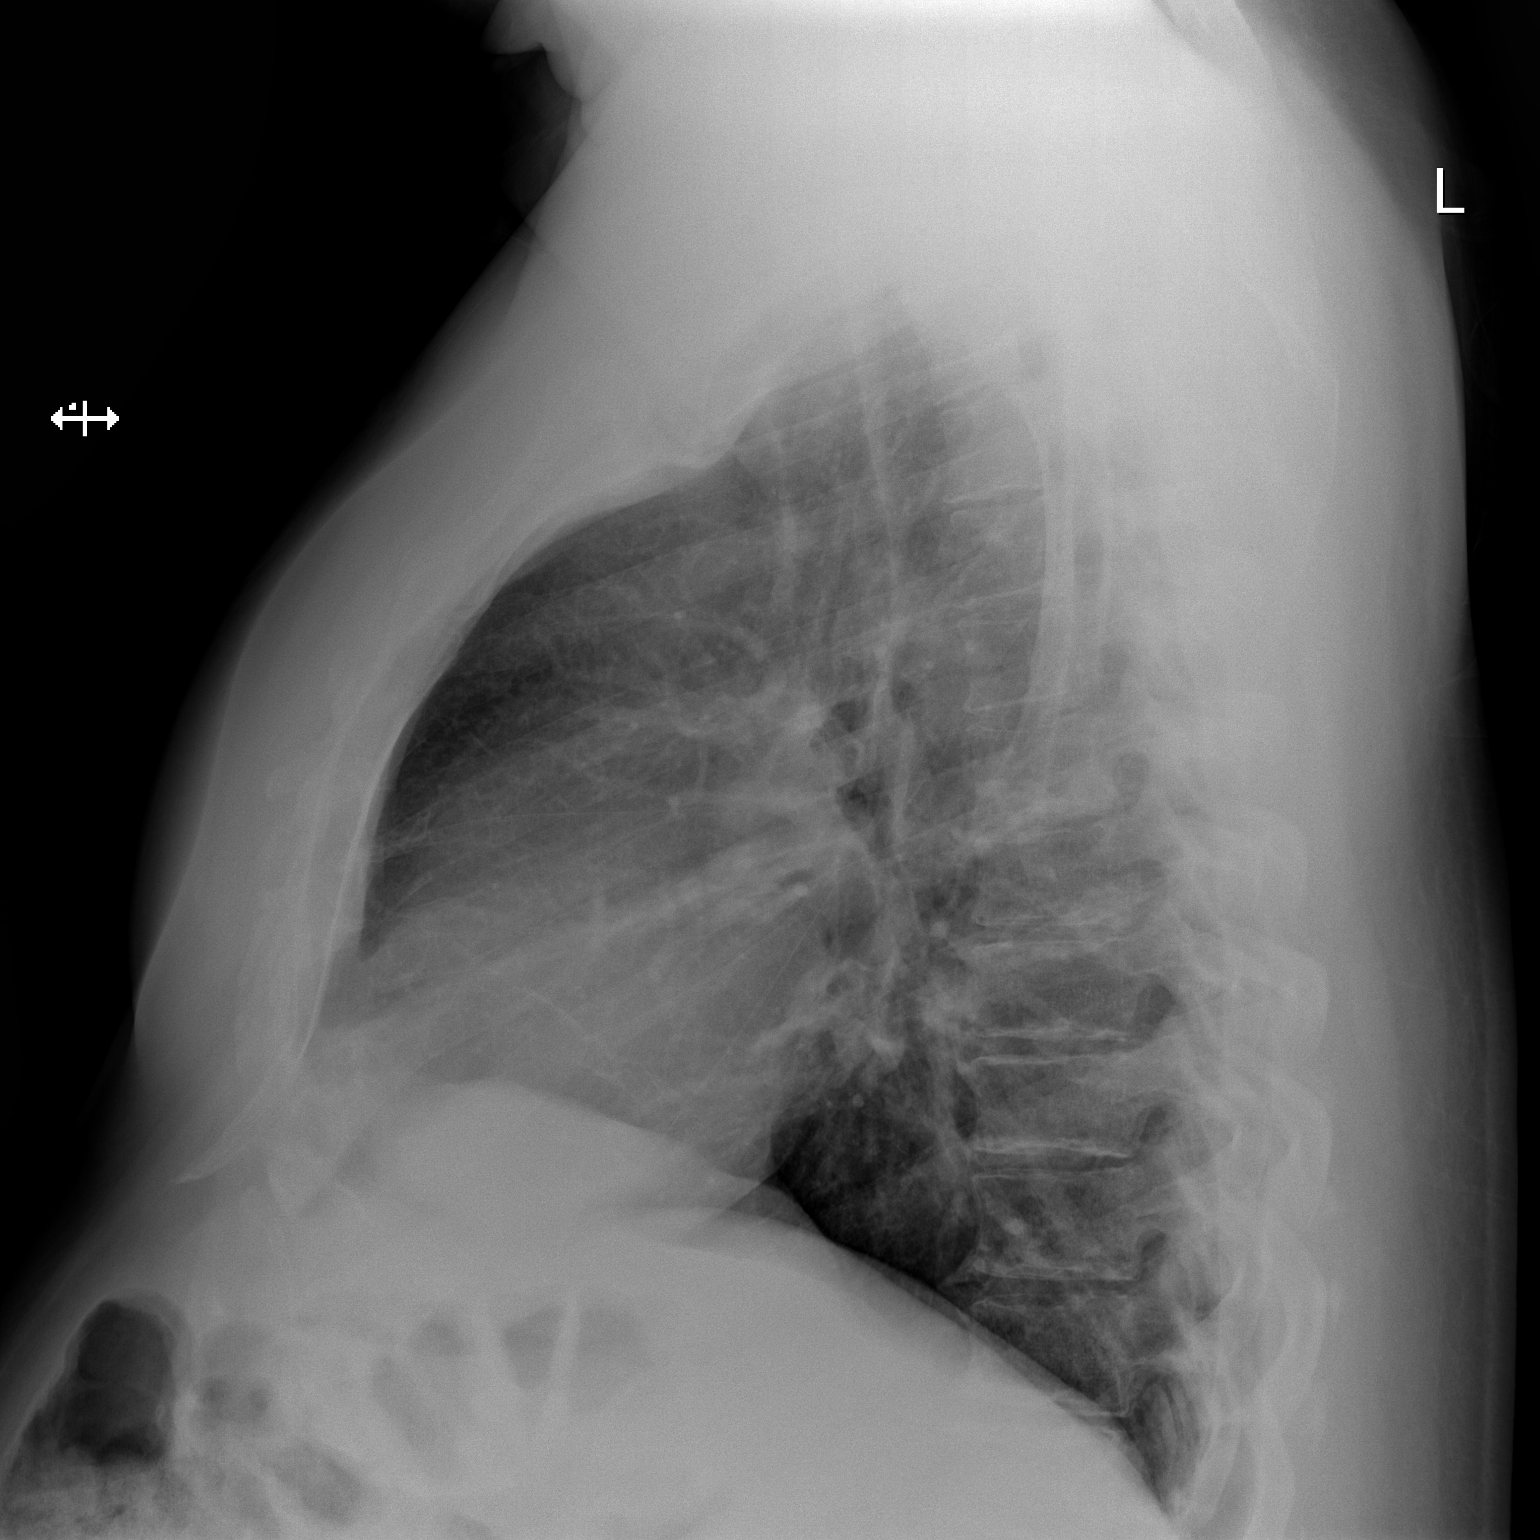

[2 of 2 positions shown; findings below may reference images not displayed]

FINDINGS: The lungs are clear. Heart size is normal. There is no pneumothorax
or pleural effusion. No focal bony abnormality is identified.
IMPRESSION: Negative chest.

## 2016-04-15 NOTE — Discharge Summary (Signed)
Patient ID: [name] MRN: [id number] DOB/AGE: [birth year] 55 y.o.  Admit date: 04/07/2016 Discharge date: 04/15/2016  Admission Diagnoses:  Active Problems:   Patella fracture   Discharge Diagnoses:  Active Problems:   Patella fracture  status post Procedure(s): OPEN REDUCTION INTERNAL (ORIF) FIXATION LEFT PATELLA  Past Medical History  Diagnosis Date  . Hypertension   . Diabetes mellitus without complication ([place])   . Wears glasses   . Arthritis   . Neuropathy ([place])     due to diabetes  . Foot drop, right     Surgeries: Procedure(s): OPEN REDUCTION INTERNAL (ORIF) FIXATION LEFT PATELLA on 04/07/2016   Consultants:    Discharged Condition: Improved  Hospital Course: [name] is an 55 y.o. male who was admitted 04/07/2016 for operative treatment of patella fracturte. Patient failed conservative treatments (please see the history and physical for the specifics) and had severe unremitting pain that affects sleep, daily activities and work/hobbies. After pre-op clearance, the patient was taken to the operating room on 04/07/2016 and underwent  Procedure(s): OPEN REDUCTION INTERNAL (ORIF) FIXATION LEFT PATELLA.    Patient was given perioperative antibiotics:  Anti-infectives    Start     Dose/Rate Route Frequency Ordered Stop   04/07/16 2045  ceFAZolin (ANCEF) IVPB 1 g/50 mL premix     1 g 100 mL/hr over 30 Minutes Intravenous Every 6 hours 04/07/16 2035 04/08/16 1253   04/07/16 1400  ceFAZolin (ANCEF) 3 g in dextrose 5 % 50 mL IVPB     3 g 130 mL/hr over 30 Minutes Intravenous On call to O.R. 04/06/16 0747 04/07/16 1505       Patient was given sequential compression devices and early ambulation to prevent DVT.   Patient benefited maximally from hospital stay and there were no complications. At the time of discharge, the patient was urinating/moving their bowels without difficulty, tolerating a regular diet, pain is controlled with oral pain medications and  they have been cleared by PT/OT.   Recent vital signs: No data found.    Recent laboratory studies: No results for input(s): WBC, HGB, HCT, PLT, NA, K, CL, CO2, BUN, CREATININE, GLUCOSE, INR, CALCIUM in the last 72 hours.  Invalid input(s): PT, 2   Discharge Medications:     Medication List    STOP taking these medications        ibuprofen 800 MG tablet  Commonly known as:  ADVIL,MOTRIN      TAKE these medications        amLODipine 10 MG tablet  Commonly known as:  NORVASC  Take 10 mg by mouth daily.     aspirin 325 MG tablet  Take 1 tablet (325 mg total) by mouth daily.     glipiZIDE 10 MG 24 hr tablet  Commonly known as:  GLUCOTROL XL  Take 10 mg by mouth 2 (two) times daily.     lisinopril 40 MG tablet  Commonly known as:  PRINIVIL,ZESTRIL  Take 40 mg by mouth daily.     metFORMIN 1000 MG tablet  Commonly known as:  GLUCOPHAGE  Take 1,000 mg by mouth 2 (two) times daily.     methocarbamol 500 MG tablet  Commonly known as:  ROBAXIN  Take 1 tablet (500 mg total) by mouth every 6 (six) hours as needed for muscle spasms.     oxyCODONE-acetaminophen 7.5-325 MG tablet  Commonly known as:  PERCOCET  Take 1-2 tablets by mouth every 6 (six) hours as needed for severe  pain.     pioglitazone 45 MG tablet  Commonly known as:  ACTOS  Take 45 mg by mouth daily.     pravastatin 10 MG tablet  Commonly known as:  PRAVACHOL  Take 10 mg by mouth daily.        Diagnostic Studies: Dg C-arm 61-120 Min  04/07/2016  CLINICAL DATA:  Left patellar fracture ORIF. EXAM: LEFT KNEE - 3 VIEW; DG C-ARM 61-120 MIN COMPARISON:  04/01/2016 and 10/20/2011. FINDINGS: Three spot fluoroscopic images demonstrate K-wire and cannulated screw fixation of the mid patellar fracture. There is near anatomic reduction of the main fracture fragments. Previous total knee arthroplasty noted. IMPRESSION: Near anatomic reduction of mid patellar fracture post ORIF. Electronically Signed   By: Richardean Sale M.D.   On: 04/07/2016 16:45   Dg Knee 2 Views Left  04/07/2016  CLINICAL DATA:  Left patellar fracture ORIF. EXAM: LEFT KNEE - 3 VIEW; DG C-ARM 61-120 MIN COMPARISON:  04/01/2016 and 10/20/2011. FINDINGS: Three spot fluoroscopic images demonstrate K-wire and cannulated screw fixation of the mid patellar fracture. There is near anatomic reduction of the main fracture fragments. Previous total knee arthroplasty noted. IMPRESSION: Near anatomic reduction of mid patellar fracture post ORIF. Electronically Signed   By: Richardean Sale M.D.   On: 04/07/2016 16:45        Discharge Instructions    Call MD / Call 911    Complete by:  As directed   If you experience chest pain or shortness of breath, CALL 911 and be transported to the hospital emergency room.  If you develope a fever above 101 F, pus (white drainage) or increased drainage or redness at the wound, or calf pain, call your surgeon's office.     Constipation Prevention    Complete by:  As directed   Drink plenty of fluids.  Prune juice may be helpful.  You may use a stool softener, such as Colace (over the counter) 100 mg twice a day.  Use MiraLax (over the counter) for constipation as needed.     Diet - low sodium heart healthy    Complete by:  As directed      Discharge instructions    Complete by:  As directed   Do not remove dressing or get wet.  Knee immobilizer must be on at all times.  Absolutely do not bend knee. Elevate foot above heart level as much as possible.  Weightbear as tolerated in knee immobilizer only.     Driving restrictions    Complete by:  As directed   No driving     Increase activity slowly as tolerated    Complete by:  As directed      Lifting restrictions    Complete by:  As directed   No lifting             Discharge Plan:  discharge to home  Disposition:     Signed: Lanae Crumbly for Dr Rodell Perna [facility] orthopedics 04/15/2016, 4:06 PM

## 2016-04-16 ENCOUNTER — Encounter (HOSPITAL_COMMUNITY): Payer: Self-pay | Admitting: Orthopaedic Surgery

## 2016-10-03 ENCOUNTER — Ambulatory Visit (INDEPENDENT_AMBULATORY_CARE_PROVIDER_SITE_OTHER): Payer: 59 | Admitting: Orthopaedic Surgery

## 2016-10-03 DIAGNOSIS — M25561 Pain in right knee: Secondary | ICD-10-CM | POA: Diagnosis not present

## 2016-11-19 ENCOUNTER — Encounter (INDEPENDENT_AMBULATORY_CARE_PROVIDER_SITE_OTHER): Payer: Self-pay | Admitting: Orthopaedic Surgery

## 2016-11-19 ENCOUNTER — Ambulatory Visit (INDEPENDENT_AMBULATORY_CARE_PROVIDER_SITE_OTHER): Payer: 59

## 2016-11-19 ENCOUNTER — Ambulatory Visit (INDEPENDENT_AMBULATORY_CARE_PROVIDER_SITE_OTHER): Payer: 59 | Admitting: Orthopaedic Surgery

## 2016-11-19 VITALS — BP 164/76 | HR 94 | Ht 74.0 in | Wt 320.0 lb

## 2016-11-19 DIAGNOSIS — E11610 Type 2 diabetes mellitus with diabetic neuropathic arthropathy: Secondary | ICD-10-CM

## 2016-11-19 DIAGNOSIS — M25562 Pain in left knee: Secondary | ICD-10-CM

## 2016-11-19 DIAGNOSIS — S82033S Displaced transverse fracture of unspecified patella, sequela: Secondary | ICD-10-CM | POA: Diagnosis not present

## 2016-11-19 NOTE — Progress Notes (Signed)
Office Visit Note   Patient: [name]           Date of Birth: [birth year]           MRN: [id number] Visit Date: 11/19/2016              Requested by: [name], MD [place], [place] [address] PCP: Gennette Pac, MD   Assessment & Plan: Visit Diagnoses:  1. Left knee pain, unspecified chronicity   2. Closed displaced transverse fracture of patella, unspecified laterality, sequela   3. Type 2 diabetes mellitus with diabetic neuropathic arthropathy, without long-term current use of insulin ([place])     Plan: Patient is ambulatory. Using a quad cane. He has full extension of his knee has good flexion. He has gained some weight and needs to get this weight off to unload his knee. We discussed surgical options with him including using some allograft to reinforce the repair. X-ray show no further displacement versus previous x-rays and has some callus. His extensor mechanism is active and functioning. If he continues to work on losing some weight with his current weight of 320 should have better function. He has problems with foot drop on the right side from previous back problems and uses a brace. He remains permanently disabled from his job. Recheck 3 months. I discussed with him that if there is further displacement he would require revision surgery but currently does not want to consider for surgery.  Follow-Up Instructions: Return in about 3 months (around 02/17/2017), or if symptoms worsen or fail to improve.   Orders:  Orders Placed This Encounter  Procedures  . XR Knee 1-2 Views Left   No orders of the defined types were placed in this encounter.     Procedures: No procedures performed   Clinical Data: No additional findings.   Subjective: Chief Complaint  Patient presents with  . Left Knee - Follow-up    Patient returns for two month follow up left knee. He states that he feels a little better and the knee seems to be moving better.  He  continues to have pain.  He wore brace up until about a week ago, the velcro wore out. He ambulates with a cane.    Review of Systems  Constitutional: Negative for chills and diaphoresis.  HENT: Negative for ear discharge, ear pain and nosebleeds.   Eyes: Negative for discharge and visual disturbance.  Respiratory: Negative for cough, choking and shortness of breath.   Cardiovascular: Negative for chest pain and palpitations.  Gastrointestinal: Negative for abdominal distention and abdominal pain.  Endocrine: Negative for cold intolerance and heat intolerance.       Positive for diabetes with active management.  Genitourinary: Negative for flank pain and hematuria.  Musculoskeletal:       Right partial foot drop brace. 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 Status post fixation with loss of position re-fixation and now with the residual 6 mm displacement  Skin: Negative for rash and wound.  Neurological: Negative for seizures and speech difficulty.  Hematological: Negative for adenopathy. Does not bruise/bleed easily.  Psychiatric/Behavioral: Negative for agitation and suicidal ideas.     Objective: Vital Signs: BP (!) 164/76   Pulse 94   Ht 6\' 2"  (1.88 m)   Wt (!) 320 lb (145.2 kg)   BMI 41.09 kg/m   Physical Exam  Constitutional: He is oriented to person, place,  and time. He appears well-developed and well-nourished.  Increased BMI with weight 320  HENT:  Head: Normocephalic and atraumatic.  Eyes: EOM are normal. Pupils are equal, round, and reactive to light.  Neck: No tracheal deviation present. No thyromegaly present.  Cardiovascular: Normal rate.   Pulmonary/Chest: Effort normal. He has no wheezes.  Abdominal: Soft. Bowel sounds are normal.  Musculoskeletal:  Healed midline incision left knee with complete healing. No drainage no cellulitis no knee effusion. Patient has full extension good quad  strength. Opposite right leg shows partial foot drop and he still wearing a short AFO brace.  Neurological: He is alert and oriented to person, place, and time.  Skin: Skin is warm and dry. Capillary refill takes less than 2 seconds.  Psychiatric: He has a normal mood and affect. His behavior is normal. Judgment and thought content normal.    Ortho Exam cessation left lower extremity is intact. He has trace quad atrophy. Full extension he can flex to 120 without pain. Collateral ligaments are stable.  Specialty Comments:  No specialty comments available.  Imaging: No results found.   PMFS History: Patient Active Problem List   Diagnosis Date Noted  . Patella fracture 04/07/2016  . Total knee replacement status 07/13/2015   Past Medical History:  Diagnosis Date  . Arthritis   . Diabetes mellitus without complication ([place])   . Foot drop, right   . Hypertension   . Neuropathy ([place])    due to diabetes  . Wears glasses     Family History  Problem Relation Age of Onset  . Hypertension Mother   . Breast cancer Mother   . Hypertension Father   . Diabetes Father   . Cancer - Lung Father   . Diabetes Sister   . Diabetes Other     Past Surgical History:  Procedure Laterality Date  . BACK SURGERY  09  . CHOLECYSTECTOMY  10  . COLONOSCOPY W/ BIOPSIES AND POLYPECTOMY    . EYE SURGERY Bilateral    strabismus   55 yrs old  . HAMMER TOE SURGERY     left  . KNEE ARTHROPLASTY Left 07/13/2015   Procedure: COMPUTER ASSISTED TOTAL KNEE ARTHROPLASTY LEFT, REMOVE ANTERIOR CRUCIATE LIGAMENT SCREWS AND STAPLES;  Surgeon: Marybelle Killings, MD;  Location: [facility];  Service: Orthopedics;  Laterality: Left;  . KNEE ARTHROSCOPY Left 96  . MULTIPLE TOOTH EXTRACTIONS    . ORIF PATELLA Left 04/07/2016   Procedure: OPEN REDUCTION INTERNAL (ORIF) FIXATION LEFT PATELLA;  Surgeon: Marybelle Killings, MD;  Location: [facility];  Service: Orthopedics;  Laterality: Left;   Social History   Occupational History  .  Not on file.   Social History Main Topics  . Smoking status: Never Smoker  . Smokeless tobacco: Never Used  . Alcohol use Yes     Comment: rare  . Drug use: No  . Sexual activity: Not on file

## 2016-12-05 ENCOUNTER — Telehealth (INDEPENDENT_AMBULATORY_CARE_PROVIDER_SITE_OTHER): Payer: Self-pay | Admitting: Orthopaedic Surgery

## 2016-12-05 ENCOUNTER — Ambulatory Visit (INDEPENDENT_AMBULATORY_CARE_PROVIDER_SITE_OTHER): Payer: 59 | Admitting: Orthopaedic Surgery

## 2016-12-05 NOTE — Telephone Encounter (Signed)
HE CAN COME BY AND GET A KNEE SLEEVE. DOES NOT NEED A HINGE BRACE SINCE NOT A COLLATERAL PROBLEM.

## 2016-12-05 NOTE — Telephone Encounter (Signed)
Please advise 

## 2016-12-05 NOTE — Telephone Encounter (Signed)
Patient called and says he was told he will need an rx for the left knee brace he is trying to get. Please advise.

## 2016-12-11 ENCOUNTER — Telehealth (INDEPENDENT_AMBULATORY_CARE_PROVIDER_SITE_OTHER): Payer: Self-pay | Admitting: Orthopaedic Surgery

## 2016-12-11 NOTE — Telephone Encounter (Signed)
noted 

## 2016-12-11 NOTE — Telephone Encounter (Signed)
Pt calling to see if the knee sleeve is ready, please see previous messages regarding. [phone]

## 2016-12-11 NOTE — Telephone Encounter (Signed)
I called patient and advised that I am in the [place] office today. I will put open patellar knee sleeve at front desk in [place] office for him tomorrow.

## 2016-12-23 ENCOUNTER — Other Ambulatory Visit (INDEPENDENT_AMBULATORY_CARE_PROVIDER_SITE_OTHER): Payer: Self-pay | Admitting: Orthopaedic Surgery

## 2016-12-23 NOTE — Telephone Encounter (Signed)
Rx refill request, please advise.

## 2017-02-17 ENCOUNTER — Ambulatory Visit (INDEPENDENT_AMBULATORY_CARE_PROVIDER_SITE_OTHER): Payer: 59 | Admitting: Orthopaedic Surgery

## 2017-02-25 ENCOUNTER — Ambulatory Visit (INDEPENDENT_AMBULATORY_CARE_PROVIDER_SITE_OTHER): Payer: Self-pay

## 2017-02-25 ENCOUNTER — Ambulatory Visit (INDEPENDENT_AMBULATORY_CARE_PROVIDER_SITE_OTHER): Payer: BLUE CROSS/BLUE SHIELD | Admitting: Orthopaedic Surgery

## 2017-02-25 ENCOUNTER — Encounter (INDEPENDENT_AMBULATORY_CARE_PROVIDER_SITE_OTHER): Payer: Self-pay | Admitting: Orthopaedic Surgery

## 2017-02-25 VITALS — BP 186/89 | HR 85 | Temp 97.7°F | Ht 75.0 in | Wt 330.0 lb

## 2017-02-25 DIAGNOSIS — M21371 Foot drop, right foot: Secondary | ICD-10-CM | POA: Diagnosis not present

## 2017-02-25 DIAGNOSIS — G8929 Other chronic pain: Secondary | ICD-10-CM | POA: Diagnosis not present

## 2017-02-25 DIAGNOSIS — M25562 Pain in left knee: Secondary | ICD-10-CM | POA: Diagnosis not present

## 2017-02-25 NOTE — Progress Notes (Signed)
Office Visit Note   Patient: [name]           Date of Birth: Jun 02, 1961           MRN: [id number] Visit Date: 02/25/2017              Requested by: [name], MD [place], [place] [address] PCP: Gennette Pac, MD   Assessment & Plan: Visit Diagnoses:  1. Chronic pain of left knee   2. Foot drop, right foot     Plan: He'll long discussion with patient regarding his left knee. States that he does not want any further surgeries. States that his knee is really functional at this point is not having a significant impact on his quality of life. She does continue to have left quad weakness but he does continue to work out. We'll continue exercise program for strengthening. If he does consider the idea of surgery in the future Dr. Lorin Mercy discussed with him that it would be with a hamstring tendon augmentation. For his right foot drop gave him a prescription to get a new AFO  [facility]. He is wearing a brace currently but I think this is allowing too much ankle plantar flexion and dorsiflexion which is contributing to his unsteadiness at times and occasional falling.  Follow-Up Instructions: Return in about 6 months (around 08/28/2017).   Orders:  Orders Placed This Encounter  Procedures  . XR Knee 1-2 Views Left   No orders of the defined types were placed in this encounter.     Procedures: No procedures performed   Clinical Data: No additional findings.   Subjective: Chief Complaint  Patient presents with  . Left Knee - Follow-up    Patient returns for 3 month follow up for left knee. ORIF left patella on 04/07/16. Patient reports having left leg weakness. Ambulates with cane. Patient states he has switched insurance carriers since last office visit and they required him to have a second opinion on his left knee.     Review of Systems  Constitutional: Negative.   HENT: Negative.   Respiratory: Negative.   Skin: Negative.   Neurological:  Negative.   Psychiatric/Behavioral: Negative.      Objective: Vital Signs: BP (!) 186/89 (BP Location: Left Arm, Patient Position: Sitting)   Pulse 85   Temp 97.7 F (36.5 C)   Ht 6\' 3"  (1.905 m)   Wt (!) 330 lb (149.7 kg)   BMI 41.25 kg/m   Physical Exam  Constitutional: He is oriented to person, place, and time. No distress.  HENT:  Head: Normocephalic.  Eyes: EOM are normal. Pupils are equal, round, and reactive to light.  Pulmonary/Chest: No respiratory distress.  Musculoskeletal:  On the right ankle patient does have anterior tib weakness. Also traced right gastroc weakness. Left knee he is able to extend his knee. He does have some tenderness at the quad tendon insertion point. Slightly tender over the patella wire.  Neurological: He is alert and oriented to person, place, and time.    Ortho Exam  Specialty Comments:  No specialty comments available.  Imaging: Xr Knee 1-2 Views Left  Result Date: 02/25/2017 X-rays left knee show that his hardware remains intact. Displaced fragment unchanged from previous x-rays December 2017.    PMFS History: Patient Active Problem List   Diagnosis Date Noted  . Patella fracture 04/07/2016  . Total knee replacement status 07/13/2015   Past Medical History:  Diagnosis Date  . Arthritis   .  Diabetes mellitus without complication ([place])   . Foot drop, right   . Hypertension   . Neuropathy ([place])    due to diabetes  . Wears glasses     Family History  Problem Relation Age of Onset  . Hypertension Mother   . Breast cancer Mother   . Hypertension Father   . Diabetes Father   . Cancer - Lung Father   . Diabetes Sister   . Diabetes Other     Past Surgical History:  Procedure Laterality Date  . BACK SURGERY  09  . CHOLECYSTECTOMY  10  . COLONOSCOPY W/ BIOPSIES AND POLYPECTOMY    . EYE SURGERY Bilateral    strabismus   56 yrs old  . HAMMER TOE SURGERY     left  . KNEE ARTHROPLASTY Left 07/13/2015   Procedure: COMPUTER  ASSISTED TOTAL KNEE ARTHROPLASTY LEFT, REMOVE ANTERIOR CRUCIATE LIGAMENT SCREWS AND STAPLES;  Surgeon: Marybelle Killings, MD;  Location: [facility];  Service: Orthopedics;  Laterality: Left;  . KNEE ARTHROSCOPY Left 96  . MULTIPLE TOOTH EXTRACTIONS    . ORIF PATELLA Left 04/07/2016   Procedure: OPEN REDUCTION INTERNAL (ORIF) FIXATION LEFT PATELLA;  Surgeon: Marybelle Killings, MD;  Location: [facility];  Service: Orthopedics;  Laterality: Left;   Social History   Occupational History  . Not on file.   Social History Main Topics  . Smoking status: Never Smoker  . Smokeless tobacco: Never Used  . Alcohol use Yes     Comment: rare  . Drug use: No  . Sexual activity: Not on file

## 2017-05-26 ENCOUNTER — Ambulatory Visit (INDEPENDENT_AMBULATORY_CARE_PROVIDER_SITE_OTHER): Payer: BLUE CROSS/BLUE SHIELD | Admitting: Orthopaedic Surgery

## 2017-05-27 ENCOUNTER — Ambulatory Visit (INDEPENDENT_AMBULATORY_CARE_PROVIDER_SITE_OTHER): Payer: Self-pay

## 2017-05-27 ENCOUNTER — Ambulatory Visit (INDEPENDENT_AMBULATORY_CARE_PROVIDER_SITE_OTHER): Payer: BLUE CROSS/BLUE SHIELD | Admitting: Orthopaedic Surgery

## 2017-05-27 ENCOUNTER — Encounter (INDEPENDENT_AMBULATORY_CARE_PROVIDER_SITE_OTHER): Payer: Self-pay | Admitting: Orthopaedic Surgery

## 2017-05-27 VITALS — BP 167/92 | HR 92 | Ht 75.0 in | Wt 320.0 lb

## 2017-05-27 DIAGNOSIS — M25562 Pain in left knee: Secondary | ICD-10-CM | POA: Diagnosis not present

## 2017-05-27 DIAGNOSIS — G8929 Other chronic pain: Secondary | ICD-10-CM | POA: Diagnosis not present

## 2017-05-27 DIAGNOSIS — M21371 Foot drop, right foot: Secondary | ICD-10-CM

## 2017-05-27 NOTE — Progress Notes (Addendum)
Office Visit Note   Patient: [name]           Date of Birth: [birth year]           MRN: [id number] Visit Date: 05/27/2017              Requested by: [name], MD [place], [place] [address] PCP: [name], MD   Assessment & Plan: Visit Diagnoses:  1. 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 2. Post L3-4 instrument FUSION 2009. 3.  Right foot drop managed with short leg done well up} Plan: Patient remains unable to work. He does well with the right short-leg brace. He is going to be getting in the pool a lot this summer to try to work on exercising more and help with weight loss she's going to the gym daily with his wife. His primary care physician is the going to start him on a different diet on his next visit.  3. Obesity-he is continuing to try to lose weight Follow-Up Instructions: Return in about 6 months (around 11/26/2017), or if symptoms worsen or fail to improve.   Orders:  Orders Placed This Encounter  Procedures  . XR Knee 1-2 Views Left   No orders of the defined types were placed in this encounter.     Procedures: No procedures performed   Clinical Data: No additional findings.   Subjective: Chief Complaint  Patient presents with  . Left Knee - Follow-up    HPI patient returns post left patellar attempted re-fixation for inferior pole fracture. Patient had the figure-of-eight wire with 2 screws placed in the transverse inferior pole patella fracture displaced. He has no extension lag he's been able tori goes the gym he rides an exercise bike exam but tori with a 4 pronged cane. 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 He denies any associated back pain after his instrumented fusion. 57 years after the instrument fusion at L3-4 was satisfactory x-rays he developed  some progression of the foot drop on the right side. Had gallbladder surgery with the postoperative abscess that required CT drainage. This problem is resolved. Patient remains out of work and is disabled from his multiple problems.  Review of Systems 57 part review of systems updated is unchanged from 02/25/2017 office visit other than as mentioned in history of present illness.   Objective: Vital Signs: BP (!) 167/92   Pulse 92   Ht 6\' 3"  (1.905 m)   Wt (!) 320 lb (145.2 kg)   BMI 40.00 kg/m   Physical Exam  Constitutional: He is oriented to person, place, and time. He appears well-developed and well-nourished.  HENT:  Head: Normocephalic and atraumatic.  Eyes: EOM are normal. Pupils are equal, round, and reactive to light.  Neck: No tracheal deviation present. No thyromegaly present.  Cardiovascular: Normal rate.   Pulmonary/Chest: Effort normal. He has no wheezes.  Abdominal: Soft. Bowel sounds are normal.  Musculoskeletal:  No pain with hip range of motion. Right knee shows some crepitus with flexion extension he has slight knee effusion on the right. Double upright short leg  brace present and patient does have the partial foot drop. He walks much better with the brace. Good anterior tib function on the left. No extension lag left knee. Wire is slightly palpable  was not tender in the left patella.  Neurological: He is alert and oriented to person, place, and time.  Skin: Skin is warm and dry. Capillary refill takes less than 2 seconds.  Psychiatric: He has a normal mood and affect. His behavior is normal. Judgment and thought content normal.    Ortho Exam  Specialty Comments:  No specialty comments available.  Imaging: Xr Knee 1-2 Views Left  Result Date: 05/27/2017 Two-view x-rays left knee shows the the wrist nonunion of the inferior pole of patella transverse fracture. 2 screws and wire remain in place. There are several millimeter gap at the fracture line. 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 Patella fracture 04/07/2016  . Total knee replacement status 07/13/2015   Past Medical History:  Diagnosis Date  . Arthritis   . Diabetes mellitus without complication ([place])   . Foot drop, right   . Hypertension   . Neuropathy    due to diabetes  . Wears glasses     Family History  Problem Relation Age of Onset  . Hypertension Mother   . Breast cancer Mother   . Hypertension Father   . Diabetes Father   . Cancer - Lung Father   . Diabetes Sister   . Diabetes Other     Past Surgical History:  Procedure Laterality Date  . BACK SURGERY  09  . CHOLECYSTECTOMY  10  . COLONOSCOPY W/ BIOPSIES AND POLYPECTOMY    . EYE SURGERY Bilateral    strabismus   56 yrs old  . HAMMER TOE SURGERY     left  . KNEE ARTHROPLASTY Left 07/13/2015   Procedure: COMPUTER ASSISTED TOTAL KNEE ARTHROPLASTY LEFT, REMOVE ANTERIOR CRUCIATE LIGAMENT SCREWS AND STAPLES;  Surgeon: Marybelle Killings, MD;  Location: [facility];  Service: Orthopedics;  Laterality: Left;  . KNEE ARTHROSCOPY Left 96  . MULTIPLE TOOTH EXTRACTIONS    . ORIF PATELLA Left 04/07/2016   Procedure: OPEN REDUCTION INTERNAL (ORIF) FIXATION LEFT PATELLA;  Surgeon: Marybelle Killings, MD;  Location: [facility];  Service: Orthopedics;  Laterality: Left;   Social History   Occupational History  . Not on file.   Social History Main Topics  . Smoking status: Never Smoker  . Smokeless tobacco: Never Used  . Alcohol use Yes     Comment: rare  . Drug use: No  . Sexual activity: Not on file

## 2017-06-25 ENCOUNTER — Telehealth (INDEPENDENT_AMBULATORY_CARE_PROVIDER_SITE_OTHER): Payer: Self-pay | Admitting: Orthopaedic Surgery

## 2017-06-25 MED ORDER — IBUPROFEN 800 MG PO TABS: ORAL_TABLET | ORAL | 3 refills | Status: DC

## 2017-06-25 NOTE — Telephone Encounter (Signed)
PT REQUESTS REFILL OF 800 IBUPROFEN PLEASE.  [facility]  [phone]

## 2017-06-25 NOTE — Telephone Encounter (Signed)
Ok for refill? 

## 2017-06-25 NOTE — Telephone Encounter (Signed)
Script sent in. Patient advised.

## 2017-06-25 NOTE — Telephone Encounter (Signed)
OK - thanks

## 2017-07-27 ENCOUNTER — Other Ambulatory Visit (INDEPENDENT_AMBULATORY_CARE_PROVIDER_SITE_OTHER): Payer: Self-pay | Admitting: Radiology

## 2017-07-27 MED ORDER — IBUPROFEN 800 MG PO TABS: ORAL_TABLET | ORAL | 0 refills | Status: DC

## 2017-10-11 ENCOUNTER — Other Ambulatory Visit (INDEPENDENT_AMBULATORY_CARE_PROVIDER_SITE_OTHER): Payer: Self-pay | Admitting: Orthopaedic Surgery

## 2017-10-12 ENCOUNTER — Telehealth (INDEPENDENT_AMBULATORY_CARE_PROVIDER_SITE_OTHER): Payer: Self-pay | Admitting: Orthopaedic Surgery

## 2017-10-12 NOTE — Telephone Encounter (Signed)
Ok for refill? 

## 2017-10-12 NOTE — Telephone Encounter (Signed)
Patient need new Rx for sole insert for [employer]. Patient has an appointment with [facility] tomorrow @ 1:00pm

## 2017-10-12 NOTE — Telephone Encounter (Signed)
Ok for script

## 2017-10-12 NOTE — Telephone Encounter (Signed)
Ok thank you 

## 2017-10-13 NOTE — Telephone Encounter (Signed)
I called [facility] to find out what script needed to state. Script faxed. Pt aware

## 2017-11-10 ENCOUNTER — Telehealth (INDEPENDENT_AMBULATORY_CARE_PROVIDER_SITE_OTHER): Payer: Self-pay | Admitting: Radiology

## 2017-11-10 NOTE — Telephone Encounter (Signed)
Pharmacy requests Rx ibuprofen 800 mg, #180 for patient.  Ok to send?

## 2017-11-10 NOTE — Telephone Encounter (Signed)
Ok thanks 

## 2017-11-11 MED ORDER — IBUPROFEN 800 MG PO TABS: 800.0000 mg | ORAL_TABLET | Freq: Two times a day (BID) | ORAL | 0 refills | Status: DC

## 2017-11-11 NOTE — Telephone Encounter (Signed)
Rx sent to pharm

## 2017-11-11 NOTE — Addendum Note (Signed)
Addended by: Brand Males E on: 11/11/2017 02:10 PM   Modules accepted: Orders

## 2017-11-24 ENCOUNTER — Ambulatory Visit (INDEPENDENT_AMBULATORY_CARE_PROVIDER_SITE_OTHER): Payer: BLUE CROSS/BLUE SHIELD | Admitting: Orthopaedic Surgery

## 2018-01-01 DIAGNOSIS — Z6841 Body Mass Index (BMI) 40.0 and over, adult: Secondary | ICD-10-CM | POA: Diagnosis not present

## 2018-01-01 DIAGNOSIS — L03115 Cellulitis of right lower limb: Secondary | ICD-10-CM | POA: Diagnosis not present

## 2018-01-01 DIAGNOSIS — Z86010 Personal history of colonic polyps: Secondary | ICD-10-CM | POA: Diagnosis not present

## 2018-01-01 DIAGNOSIS — I10 Essential (primary) hypertension: Secondary | ICD-10-CM | POA: Diagnosis not present

## 2018-01-01 DIAGNOSIS — E782 Mixed hyperlipidemia: Secondary | ICD-10-CM | POA: Diagnosis not present

## 2018-01-01 DIAGNOSIS — Z7984 Long term (current) use of oral hypoglycemic drugs: Secondary | ICD-10-CM | POA: Diagnosis not present

## 2018-01-01 DIAGNOSIS — E119 Type 2 diabetes mellitus without complications: Secondary | ICD-10-CM | POA: Diagnosis not present

## 2018-03-19 DIAGNOSIS — E119 Type 2 diabetes mellitus without complications: Secondary | ICD-10-CM | POA: Diagnosis not present

## 2018-03-19 DIAGNOSIS — Z6841 Body Mass Index (BMI) 40.0 and over, adult: Secondary | ICD-10-CM | POA: Diagnosis not present

## 2018-03-19 DIAGNOSIS — L03115 Cellulitis of right lower limb: Secondary | ICD-10-CM | POA: Diagnosis not present

## 2018-03-24 ENCOUNTER — Encounter: Payer: Medicare HMO | Attending: Internal Medicine | Admitting: Internal Medicine

## 2018-03-24 DIAGNOSIS — L97212 Non-pressure chronic ulcer of right calf with fat layer exposed: Secondary | ICD-10-CM | POA: Diagnosis not present

## 2018-03-24 DIAGNOSIS — E1140 Type 2 diabetes mellitus with diabetic neuropathy, unspecified: Secondary | ICD-10-CM | POA: Insufficient documentation

## 2018-03-24 DIAGNOSIS — E11622 Type 2 diabetes mellitus with other skin ulcer: Secondary | ICD-10-CM | POA: Diagnosis not present

## 2018-03-24 DIAGNOSIS — I890 Lymphedema, not elsewhere classified: Secondary | ICD-10-CM | POA: Diagnosis not present

## 2018-03-24 DIAGNOSIS — I872 Venous insufficiency (chronic) (peripheral): Secondary | ICD-10-CM | POA: Diagnosis not present

## 2018-03-24 DIAGNOSIS — L539 Erythematous condition, unspecified: Secondary | ICD-10-CM | POA: Diagnosis not present

## 2018-03-24 DIAGNOSIS — S81801A Unspecified open wound, right lower leg, initial encounter: Secondary | ICD-10-CM | POA: Diagnosis not present

## 2018-03-24 DIAGNOSIS — I10 Essential (primary) hypertension: Secondary | ICD-10-CM | POA: Diagnosis not present

## 2018-03-24 DIAGNOSIS — L97211 Non-pressure chronic ulcer of right calf limited to breakdown of skin: Secondary | ICD-10-CM | POA: Diagnosis not present

## 2018-03-24 DIAGNOSIS — Z7984 Long term (current) use of oral hypoglycemic drugs: Secondary | ICD-10-CM | POA: Diagnosis not present

## 2018-03-24 DIAGNOSIS — M21371 Foot drop, right foot: Secondary | ICD-10-CM | POA: Diagnosis not present

## 2018-03-24 DIAGNOSIS — I87331 Chronic venous hypertension (idiopathic) with ulcer and inflammation of right lower extremity: Secondary | ICD-10-CM | POA: Insufficient documentation

## 2018-03-24 DIAGNOSIS — Z6841 Body Mass Index (BMI) 40.0 and over, adult: Secondary | ICD-10-CM | POA: Insufficient documentation

## 2018-03-24 DIAGNOSIS — L309 Dermatitis, unspecified: Secondary | ICD-10-CM | POA: Diagnosis not present

## 2018-03-26 NOTE — Progress Notes (Signed)
EDAHI, KROENING ([id number]) Visit Report for 03/24/2018 Allergy List Details Patient Name: [name], [name] Date of Service: 03/24/2018 8:00 AM Medical Record Number: [id number] Patient Account Number: [id number] Date of Birth/Sex: 11-Jul-1961 (57 y.o. Male) Treating RN: Montey Hora Primary Care Acie Custis: [name] Other Clinician: Referring Makari Sanko: [name] Treating Dominyck Reser/Extender: Ricard Dillon Weeks in Treatment: 0 Allergies Active Allergies amlodipine Allergy Notes Electronic Signature(s) Signed: 03/24/2018 5:03:40 PM By: Montey Hora Entered By: Montey Hora on 03/24/2018 08:08:21 [name] ([id number]) -------------------------------------------------------------------------------- Arrival Information Details Patient Name: [name] Date of Service: 03/24/2018 8:00 AM Medical Record Number: [id number] Patient Account Number: [id number] Date of Birth/Sex: 01/26/1961 (57 y.o. Male) Treating RN: Montey Hora Primary Care Jennavecia Schwier: [name] Other Clinician: Referring Daivd Fredericksen: [name] Treating Jonika Critz/Extender: Tito Dine in Treatment: 0 Visit Information Patient Arrived: Ambulatory Arrival Time: 08:07 Accompanied By: self Transfer Assistance: None Patient Identification Verified: Yes Secondary Verification Process Completed: Yes Patient Has Alerts: Yes Patient Alerts: DMII Electronic Signature(s) Signed: 03/24/2018 5:03:40 PM By: Montey Hora Entered By: Montey Hora on 03/24/2018 08:07:47 [name] ([id number]) -------------------------------------------------------------------------------- Clinic Level of Care Assessment Details Patient Name: [name] Date of Service: 03/24/2018 8:00 AM Medical Record Number: [id number] Patient Account Number: [id number] Date of Birth/Sex: 11/04/1961 (57 y.o. Male) Treating RN: Cornell Barman Primary Care Isela Stantz: [name] Other Clinician: Referring Machelle Raybon:  [name] Treating June Vacha/Extender: Tito Dine in Treatment: 0 Clinic Level of Care Assessment Items TOOL 1 Quantity Score []  - Use when EandM and Procedure is performed on INITIAL visit 0 ASSESSMENTS - Nursing Assessment / Reassessment X - General Physical Exam (combine w/ comprehensive assessment (listed just below) when 1 20 performed on new pt. evals) X- 1 25 Comprehensive Assessment (HX, ROS, Risk Assessments, Wounds Hx, etc.) ASSESSMENTS - Wound and Skin Assessment / Reassessment []  - Dermatologic / Skin Assessment (not related to wound area) 0 ASSESSMENTS - Ostomy and/or Continence Assessment and Care []  - Incontinence Assessment and Management 0 []  - 0 Ostomy Care Assessment and Management (repouching, etc.) PROCESS - Coordination of Care X - Simple Patient / Family Education for ongoing care 1 15 []  - 0 Complex (extensive) Patient / Family Education for ongoing care []  - 0 Staff obtains Programmer, systems, Records, Test Results / Process Orders []  - 0 Staff telephones HHA, Nursing Homes / Clarify orders / etc []  - 0 Routine Transfer to another Facility (non-emergent condition) []  - 0 Routine Hospital Admission (non-emergent condition) X- 1 15 New Admissions / Biomedical engineer / Ordering NPWT, Apligraf, etc. []  - 0 Emergency Hospital Admission (emergent condition) PROCESS - Special Needs []  - Pediatric / Minor Patient Management 0 []  - 0 Isolation Patient Management []  - 0 Hearing / Language / Visual special needs []  - 0 Assessment of Community assistance (transportation, D/C planning, etc.) []  - 0 Additional assistance / Altered mentation []  - 0 Support Surface(s) Assessment (bed, cushion, seat, etc.) [name], Christopher K. ([id number]) INTERVENTIONS - Miscellaneous []  - External ear exam 0 []  - 0 Patient Transfer (multiple staff / Civil Service fast streamer / Similar devices) []  - 0 Simple Staple / Suture removal (25 or less) []  - 0 Complex Staple / Suture  removal (26 or more) []  - 0 Hypo/Hyperglycemic Management (do not check if billed separately) X- 1 15 Ankle / Brachial Index (ABI) - do not check if billed separately Has the patient been seen at the hospital within the last three years: Yes Total Score: 90 Level Of Care:  New/Established - Level 3 Electronic Signature(s) Signed: 03/24/2018 1:43:28 PM By: Gretta Cool, BSN, RN, CWS, Kim RN, BSN Entered By: Gretta Cool, BSN, RN, CWS, Kim on 03/24/2018 09:00:28 [name] ([id number]) -------------------------------------------------------------------------------- Compression Therapy Details Patient Name: [name] Date of Service: 03/24/2018 8:00 AM Medical Record Number: [id number] Patient Account Number: [id number] Date of Birth/Sex: 12-13-1961 (57 y.o. Male) Treating RN: Cornell Barman Primary Care Graham Doukas: [name] Other Clinician: Referring Milynn Quirion: [name] Treating Owynn Mosqueda/Extender: Tito Dine in Treatment: 0 Compression Therapy Performed for Wound Assessment: Wound #1 Right,Posterior Lower Leg Performed By: Clinician Cornell Barman, RN Compression Type: Four Layer Pre Treatment ABI: 1.1 Post Procedure Diagnosis Same as Pre-procedure Electronic Signature(s) Signed: 03/24/2018 1:43:28 PM By: Gretta Cool, BSN, RN, CWS, Kim RN, BSN Entered By: Gretta Cool, BSN, RN, CWS, Kim on 03/24/2018 08:59:56 [name] ([id number]) -------------------------------------------------------------------------------- Encounter Discharge Information Details Patient Name: [name] Date of Service: 03/24/2018 8:00 AM Medical Record Number: [id number] Patient Account Number: [id number] Date of Birth/Sex: Jan 02, 1961 (56 y.o. Male) Treating RN: Cornell Barman Primary Care Ramesh Moan: [name] Other Clinician: Referring Lyris Hitchman: [name] Treating Letrell Attwood/Extender: Tito Dine in Treatment: 0 Encounter Discharge Information Items Discharge Pain Level: 0 Discharge  Condition: Stable Ambulatory Status: Cane Discharge Destination: Home Transportation: Private Auto Schedule Follow-up Appointment: Yes Medication Reconciliation completed and No provided to Patient/Care Saloma Cadena: Provided on Clinical Summary of Care: 03/24/2018 Form Type Recipient Paper Patient BS Electronic Signature(s) Signed: 03/24/2018 12:48:09 PM By: Roger Shelter Entered By: Roger Shelter on 03/24/2018 09:23:22 [name] ([id number]) -------------------------------------------------------------------------------- Lower Extremity Assessment Details Patient Name: [name] Date of Service: 03/24/2018 8:00 AM Medical Record Number: [id number] Patient Account Number: [id number] Date of Birth/Sex: 1961/03/02 (57 y.o. Male) Treating RN: Montey Hora Primary Care Micco Bourbeau: [name] Other Clinician: Referring Wolfgang Finigan: [name] Treating Ellan Tess/Extender: Ricard Dillon Weeks in Treatment: 0 Edema Assessment Assessed: [Left: No] [Right: No] Edema: [Left: Yes] [Right: Yes] Calf Left: Right: Point of Measurement: 36 cm From Medial Instep 50.5 cm 52.2 cm Ankle Left: Right: Point of Measurement: 14 cm From Medial Instep 33.6 cm 30.2 cm Vascular Assessment Pulses: Dorsalis Pedis Palpable: [Left:Yes] [Right:Yes] Doppler Audible: [Left:Yes] [Right:Yes] Posterior Tibial Palpable: [Left:Yes] [Right:Yes] Doppler Audible: [Left:Yes] [Right:Yes] Extremity colors, hair growth, and conditions: Extremity Color: [Left:Hyperpigmented] [Right:Hyperpigmented] Hair Growth on Extremity: [Left:Yes] [Right:Yes] Temperature of Extremity: [Left:Warm] [Right:Warm] Capillary Refill: [Left:< 3 seconds] [Right:< 3 seconds] Blood Pressure: Brachial: [Left:152] [Right:152] Dorsalis Pedis: 152 [Left:Dorsalis Pedis: 162] Ankle: Posterior Tibial: 150 [Left:Posterior Tibial: 158 1.00] [Right:1.07] Toe Nail Assessment Left: Right: Thick: Yes Yes Discolored: Yes  Yes Deformed: Yes Yes Improper Length and Hygiene: No No Electronic Signature(s) Signed: 03/24/2018 5:03:40 PM By: Montey Hora Entered By: Montey Hora on 03/24/2018 08:42:11 [name] ([id number] Hines, [name] ([id number]) -------------------------------------------------------------------------------- Multi Wound Chart Details Patient Name: [name] Date of Service: 03/24/2018 8:00 AM Medical Record Number: [id number] Patient Account Number: [id number] Date of Birth/Sex: [birth year] (57 y.o. Male) Treating RN: Cornell Barman Primary Care Wilmer Berryhill: [name] Other Clinician: Referring Goran Olden: [name] Treating Bobby Barton/Extender: Ricard Dillon Weeks in Treatment: 0 Vital Signs Height(in): 75 Pulse(bpm): 94 Weight(lbs): 343 Blood Pressure(mmHg): 154/82 Body Mass Index(BMI): 43 Temperature(F): 98.2 Respiratory Rate 18 (breaths/min): Photos: [N/A:N/A] Wound Location: Right Lower Leg - Posterior Right Lower Leg - Anterior N/A Wounding Event: Trauma Trauma N/A Primary Etiology: Diabetic Wound/Ulcer of the Diabetic Wound/Ulcer of the N/A Lower Extremity Lower Extremity Secondary Etiology: Venous Leg Ulcer Venous Leg Ulcer N/A Comorbid  History: Hypertension, Type II Hypertension, Type II N/A Diabetes, Neuropathy Diabetes, Neuropathy Date Acquired: 11/23/2017 11/23/2017 N/A Weeks of Treatment: 0 0 N/A Wound Status: Open Open N/A Measurements L x W x D 9.6x3.2x0.1 2.2x2.4x0.1 N/A (cm) Area (cm) : 24.127 4.147 N/A Volume (cm) : 2.413 0.415 N/A Classification: Grade 1 Grade 1 N/A Exudate Amount: Large Large N/A Exudate Type: Serous Serous N/A Exudate Color: amber amber N/A Wound Margin: Flat and Intact Flat and Intact N/A Granulation Amount: Medium (34-66%) Large (67-100%) N/A Granulation Quality: Red Red N/A Necrotic Amount: Medium (34-66%) Small (1-33%) N/A Exposed Structures: Fat Layer (Subcutaneous Fascia: No N/A Tissue) Exposed: Yes Fat  Layer (Subcutaneous Fascia: No Tissue) Exposed: No Tendon: No Tendon: No Muscle: No Muscle: No Joint: No Joint: No Bone: No Bone: No Postell, Wyley K. ([id number]) Epithelialization: Small (1-33%) Small (1-33%) N/A Periwound Skin Texture: Excoriation: Yes Excoriation: Yes N/A Induration: No Induration: No Callus: No Callus: No Crepitus: No Crepitus: No Rash: No Rash: No Scarring: No Scarring: No Periwound Skin Moisture: Maceration: No Maceration: No N/A Dry/Scaly: No Dry/Scaly: No Periwound Skin Color: Hemosiderin Staining: Yes Hemosiderin Staining: Yes N/A Atrophie Blanche: No Atrophie Blanche: No Cyanosis: No Cyanosis: No Ecchymosis: No Ecchymosis: No Erythema: No Erythema: No Mottled: No Mottled: No Pallor: No Pallor: No Rubor: No Rubor: No Temperature: No Abnormality No Abnormality N/A Tenderness on Palpation: No No N/A Wound Preparation: Ulcer Cleansing: Ulcer Cleansing: N/A Rinsed/Irrigated with Saline Rinsed/Irrigated with Saline Topical Anesthetic Applied: Topical Anesthetic Applied: Other: lidocaine 4% Other: lidocaine 4% Procedures Performed: Compression Therapy N/A N/A Treatment Notes Electronic Signature(s) Signed: 03/24/2018 5:02:04 PM By: Linton Ham MD Entered By: Linton Ham on 03/24/2018 09:05:44 [name] ([id number]) -------------------------------------------------------------------------------- [place] Details Patient Name: [name] Date of Service: 03/24/2018 8:00 AM Medical Record Number: [id number] Patient Account Number: [id number] Date of Birth/Sex: 12-02-1961 (57 y.o. Male) Treating RN: Cornell Barman Primary Care Milley Vining: [name] Other Clinician: Referring Suheyla Mortellaro: [name] Treating Burleigh Brockmann/Extender: Tito Dine in Treatment: 0 Active Inactive ` Orientation to the Wound Care Program Nursing Diagnoses: Knowledge deficit related to the wound healing center  program Goals: Patient/caregiver will verbalize understanding of the [place] Program Date Initiated: 03/24/2018 Target Resolution Date: 04/23/2018 Goal Status: Active Interventions: Provide education on orientation to the wound center Notes: ` Soft Tissue Infection Nursing Diagnoses: Potential for infection: soft tissue Goals: Patient will remain free of wound infection Date Initiated: 03/24/2018 Target Resolution Date: 04/23/2018 Goal Status: Active Interventions: Assess signs and symptoms of infection every visit Treatment Activities: Systemic antibiotics : 03/24/2018 Notes: ` Wound/Skin Impairment Nursing Diagnoses: Impaired tissue integrity Goals: Ulcer/skin breakdown will have a volume reduction of 80% by week 12 Date Initiated: 03/24/2018 Target Resolution Date: 07/24/2018 [name], [name] ([id number]) Goal Status: Active Ulcer/skin breakdown will heal within 14 weeks Date Initiated: 03/24/2018 Target Resolution Date: 07/24/2018 Goal Status: Active Interventions: Assess ulceration(s) every visit Treatment Activities: Referred to DME Jacqulyn Barresi for dressing supplies : 03/24/2018 Topical wound management initiated : 03/24/2018 Notes: Electronic Signature(s) Signed: 03/24/2018 1:43:28 PM By: Gretta Cool, BSN, RN, CWS, Kim RN, BSN Entered By: Gretta Cool, BSN, RN, CWS, Kim on 03/24/2018 08:50:57 [name] ([id number]) -------------------------------------------------------------------------------- Pain Assessment Details Patient Name: [name] Date of Service: 03/24/2018 8:00 AM Medical Record Number: [id number] Patient Account Number: [id number] Date of Birth/Sex: 1961/01/29 (57 y.o. Male) Treating RN: Montey Hora Primary Care Mervin Ramires: [name] Other Clinician: Referring Ghazi Rumpf: [name] Treating Korvin Valentine/Extender: Ricard Dillon Weeks in Treatment: 0  Active Problems Location of Pain Severity and Description of Pain Patient Has Paino  No Site Locations Pain Management and Medication Current Pain Management: Notes Topical or injectable lidocaine is offered to patient for acute pain when surgical debridement is performed. If needed, Patient is instructed to use over the counter pain medication for the following 24-48 hours after debridement. Wound care MDs do not prescribed pain medications. Patient has chronic pain or uncontrolled pain. Patient has been instructed to make an appointment with their Primary Care Physician for pain management. Electronic Signature(s) Signed: 03/24/2018 5:03:40 PM By: Montey Hora Entered By: Montey Hora on 03/24/2018 08:16:29 [name] ([id number]) -------------------------------------------------------------------------------- Patient/Caregiver Education Details Patient Name: [name] Date of Service: 03/24/2018 8:00 AM Medical Record Number: [id number] Patient Account Number: [id number] Date of Birth/Gender: July 10, 1961 (57 y.o. Male) Treating RN: Roger Shelter Primary Care Physician: [name] Other Clinician: Referring Physician: [name] Treating Physician/Extender: Tito Dine in Treatment: 0 Education Assessment Education Provided To: Patient Education Topics Provided Welcome To [facility]: Handouts: Welcome To [facility] Methods: Explain/Verbal Responses: State content correctly Wound/Skin Impairment: Handouts: Caring for Your Ulcer Methods: Explain/Verbal Responses: State content correctly Electronic Signature(s) Signed: 03/24/2018 12:48:09 PM By: Roger Shelter Entered By: Roger Shelter on 03/24/2018 09:23:45 [name] ([id number]) -------------------------------------------------------------------------------- Wound Assessment Details Patient Name: [name] Date of Service: 03/24/2018 8:00 AM Medical Record Number: [id number] Patient Account Number: [id number] Date of Birth/Sex: 28-Oct-1961  (57 y.o. Male) Treating RN: Montey Hora Primary Care Thaer Miyoshi: [name] Other Clinician: Referring Farra Nikolic: [name] Treating Tamatha Gadbois/Extender: Ricard Dillon Weeks in Treatment: 0 Wound Status Wound Number: 1 Primary Etiology: Diabetic Wound/Ulcer of the Lower Extremity Wound Location: Right Lower Leg - Posterior Secondary Venous Leg Ulcer Wounding Event: Trauma Etiology: Date Acquired: 11/23/2017 Wound Status: Open Weeks Of Treatment: 0 Comorbid History: Hypertension, Type II Diabetes, Clustered Wound: No Neuropathy Photos Photo Uploaded By: Montey Hora on 03/24/2018 09:05:02 Wound Measurements Length: (cm) 9.6 Width: (cm) 3.2 Depth: (cm) 0.1 Area: (cm) 24.127 Volume: (cm) 2.413 % Reduction in Area: % Reduction in Volume: Epithelialization: Small (1-33%) Tunneling: No Undermining: No Wound Description Classification: Grade 1 Wound Margin: Flat and Intact Exudate Amount: Large Exudate Type: Serous Exudate Color: amber Foul Odor After Cleansing: No Slough/Fibrino Yes Wound Bed Granulation Amount: Medium (34-66%) Exposed Structure Granulation Quality: Red Fascia Exposed: No Necrotic Amount: Medium (34-66%) Fat Layer (Subcutaneous Tissue) Exposed: Yes Necrotic Quality: Adherent Slough Tendon Exposed: No Muscle Exposed: No Joint Exposed: No Bone Exposed: No Periwound Skin Texture [name], Christopher K. ([id number]) Texture Color No Abnormalities Noted: No No Abnormalities Noted: No Callus: No Atrophie Blanche: No Crepitus: No Cyanosis: No Excoriation: Yes Ecchymosis: No Induration: No Erythema: No Rash: No Hemosiderin Staining: Yes Scarring: No Mottled: No Pallor: No Moisture Rubor: No No Abnormalities Noted: No Dry / Scaly: No Temperature / Pain Maceration: No Temperature: No Abnormality Wound Preparation Ulcer Cleansing: Rinsed/Irrigated with Saline Topical Anesthetic Applied: Other: lidocaine 4%, Treatment Notes Wound  #1 (Right, Posterior Lower Leg) 1. Cleansed with: Clean wound with Normal Saline 2. Anesthetic Topical Lidocaine 4% cream to wound bed prior to debridement 3. Peri-wound Care: Other peri-wound care (specify in notes) 4. Dressing Applied: Other dressing (specify in notes) 7. Secured with 4-Layer Compression System - Right Lower Extremity Notes tca cream, silvercell, xtrasorb, Electronic Signature(s) Signed: 03/24/2018 5:03:40 PM By: Montey Hora Entered By: Montey Hora on 03/24/2018 08:30:21 [name] ([id number]) -------------------------------------------------------------------------------- Wound Assessment Details Patient Name:  [name] Date of Service: 03/24/2018 8:00 AM Medical Record Number: [id number] Patient Account Number: [id number] Date of Birth/Sex: [birth year] (57 y.o. Male) Treating RN: Montey Hora Primary Care Larhonda Dettloff: [name] Other Clinician: Referring Cristal Howatt: [name] Treating Delicia Berens/Extender: Ricard Dillon Weeks in Treatment: 0 Wound Status Wound Number: 2 Primary Etiology: Diabetic Wound/Ulcer of the Lower Extremity Wound Location: Right Lower Leg - Anterior Secondary Venous Leg Ulcer Wounding Event: Trauma Etiology: Date Acquired: 11/23/2017 Wound Status: Open Weeks Of Treatment: 0 Comorbid History: Hypertension, Type II Diabetes, Clustered Wound: No Neuropathy Photos Photo Uploaded By: Montey Hora on 03/24/2018 09:05:03 Wound Measurements Length: (cm) 2.2 Width: (cm) 2.4 Depth: (cm) 0.1 Area: (cm) 4.147 Volume: (cm) 0.415 % Reduction in Area: % Reduction in Volume: Epithelialization: Small (1-33%) Tunneling: No Undermining: No Wound Description Classification: Grade 1 Wound Margin: Flat and Intact Exudate Amount: Large Exudate Type: Serous Exudate Color: amber Foul Odor After Cleansing: No Slough/Fibrino Yes Wound Bed Granulation Amount: Large (67-100%) Exposed Structure Granulation  Quality: Red Fascia Exposed: No Necrotic Amount: Small (1-33%) Fat Layer (Subcutaneous Tissue) Exposed: No Necrotic Quality: Adherent Slough Tendon Exposed: No Muscle Exposed: No Joint Exposed: No Bone Exposed: No Periwound Skin Texture [name], Christopher K. ([id number]) Texture Color No Abnormalities Noted: No No Abnormalities Noted: No Callus: No Atrophie Blanche: No Crepitus: No Cyanosis: No Excoriation: Yes Ecchymosis: No Induration: No Erythema: No Rash: No Hemosiderin Staining: Yes Scarring: No Mottled: No Pallor: No Moisture Rubor: No No Abnormalities Noted: No Dry / Scaly: No Temperature / Pain Maceration: No Temperature: No Abnormality Wound Preparation Ulcer Cleansing: Rinsed/Irrigated with Saline Topical Anesthetic Applied: Other: lidocaine 4%, Treatment Notes Wound #2 (Right, Anterior Lower Leg) 1. Cleansed with: Clean wound with Normal Saline 2. Anesthetic Topical Lidocaine 4% cream to wound bed prior to debridement 3. Peri-wound Care: Other peri-wound care (specify in notes) 4. Dressing Applied: Other dressing (specify in notes) 7. Secured with 4-Layer Compression System - Right Lower Extremity Notes tca cream, silvercell, xtrasorb, Electronic Signature(s) Signed: 03/24/2018 5:03:40 PM By: Montey Hora Entered By: Montey Hora on 03/24/2018 08:32:12 [name] ([id number]) -------------------------------------------------------------------------------- Vitals Details Patient Name: [name] Date of Service: 03/24/2018 8:00 AM Medical Record Number: [id number] Patient Account Number: [id number] Date of Birth/Sex: January 05, 1961 (57 y.o. Male) Treating RN: Montey Hora Primary Care Alvan Culpepper: [name] Other Clinician: Referring Rushi Chasen: [name] Treating Brynlee Pennywell/Extender: Tito Dine in Treatment: 0 Vital Signs Time Taken: 08:16 Temperature (F): 98.2 Height (in): 75 Pulse (bpm): 94 Source:  Measured Respiratory Rate (breaths/min): 18 Weight (lbs): 343 Blood Pressure (mmHg): 154/82 Source: Measured Reference Range: 80 - 120 mg / dl Body Mass Index (BMI): 42.9 Electronic Signature(s) Signed: 03/24/2018 5:03:40 PM By: Montey Hora Entered By: Montey Hora on 03/24/2018 08:21:00

## 2018-03-26 NOTE — Progress Notes (Signed)
OSHER, OETTINGER ([id number]) Visit Report for 03/24/2018 Abuse/Suicide Risk Screen Details Patient Name: [name] Date of Service: 03/24/2018 8:00 AM Medical Record Number: [id number] Patient Account Number: [id number] Date of Birth/Sex: 12/02/1961 (57 y.o. Male) Treating RN: Montey Hora Primary Care Damira Kem: [name] Other Clinician: Referring Russell Engelstad: [name] Treating Ozie Lupe/Extender: Ricard Dillon Weeks in Treatment: 0 Abuse/Suicide Risk Screen Items Answer ABUSE/SUICIDE RISK SCREEN: Has anyone close to you tried to hurt or harm you recentlyo No Do you feel uncomfortable with anyone in your familyo No Has anyone forced you do things that you didnot want to doo No Do you have any thoughts of harming yourselfo No Patient displays signs or symptoms of abuse and/or neglect. No Electronic Signature(s) Signed: 03/24/2018 5:03:40 PM By: Montey Hora Entered By: Montey Hora on 03/24/2018 08:08:32 [name] ([id number]) -------------------------------------------------------------------------------- Activities of Daily Living Details Patient Name: [name] Date of Service: 03/24/2018 8:00 AM Medical Record Number: [id number] Patient Account Number: [id number] Date of Birth/Sex: 06/18/1961 (57 y.o. Male) Treating RN: Montey Hora Primary Care Shaniya Tashiro: [name] Other Clinician: Referring Xena Propst: [name] Treating Tinya Cadogan/Extender: Ricard Dillon Weeks in Treatment: 0 Activities of Daily Living Items Answer Activities of Daily Living (Please select one for each item) Drive Automobile Completely Able Take Medications Completely Able Use Telephone Completely Able Care for Appearance Completely Able Use Toilet Completely Able Bath / Shower Completely Able Dress Self Completely Able Feed Self Completely Able Walk Completely Able Get In / Out Bed Completely Able Housework Completely Able Prepare Meals Completely [place] for Self Completely Able Electronic Signature(s) Signed: 03/24/2018 5:03:40 PM By: Montey Hora Entered By: Montey Hora on 03/24/2018 08:09:17 [name], [name] ([id number]) -------------------------------------------------------------------------------- Education Assessment Details Patient Name: [name] Date of Service: 03/24/2018 8:00 AM Medical Record Number: [id number] Patient Account Number: [id number] Date of Birth/Sex: September 29, 1961 (57 y.o. Male) Treating RN: Montey Hora Primary Care Sedale Jenifer: [name] Other Clinician: Referring Urbano Milhouse: [name] Treating Zethan Alfieri/Extender: Tito Dine in Treatment: 0 Primary Learner Assessed: Patient Learning Preferences/Education Level/Primary Language Learning Preference: Explanation, Demonstration Highest Education Level: College or Above Preferred Language: English Cognitive Barrier Assessment/Beliefs Language Barrier: No Translator Needed: No Memory Deficit: No Emotional Barrier: No Cultural/Religious Beliefs Affecting Medical Care: No Physical Barrier Assessment Impaired Vision: No Impaired Hearing: No Decreased Hand dexterity: No Knowledge/Comprehension Assessment Knowledge Level: Medium Comprehension Level: Medium Ability to understand written Medium instructions: Ability to understand verbal Medium instructions: Motivation Assessment Anxiety Level: Calm Cooperation: Cooperative Education Importance: Acknowledges Need Interest in Health Problems: Asks Questions Perception: Coherent Willingness to Engage in Self- Medium Management Activities: Readiness to Engage in Self- Medium Management Activities: Electronic Signature(s) Signed: 03/24/2018 5:03:40 PM By: Montey Hora Entered By: Montey Hora on 03/24/2018 08:09:39 [name] ([id number]) -------------------------------------------------------------------------------- Fall Risk Assessment  Details Patient Name: [name] Date of Service: 03/24/2018 8:00 AM Medical Record Number: [id number] Patient Account Number: [id number] Date of Birth/Sex: [birth year] (57 y.o. Male) Treating RN: Montey Hora Primary Care Anirudh Baiz: [name] Other Clinician: Referring Lamia Mariner: [name] Treating Satonya Lux/Extender: Tito Dine in Treatment: 0 Fall Risk Assessment Items Have you had 2 or more falls in the last 12 monthso 0 No Have you had any fall that resulted in injury in the last 12 monthso 0 No FALL RISK ASSESSMENT: History of falling - immediate or within 3 months 0 No Secondary diagnosis 0 No Ambulatory aid None/bed rest/wheelchair/nurse 0 No Crutches/cane/walker 0 No Furniture  0 No IV Access/Saline Lock 0 No Gait/Training Normal/bed rest/immobile 0 No Weak 10 Yes Impaired 0 No Mental Status Oriented to own ability 0 Yes Electronic Signature(s) Signed: 03/24/2018 5:03:40 PM By: Montey Hora Entered By: Montey Hora on 03/24/2018 08:09:49 [name] ([id number]) -------------------------------------------------------------------------------- Foot Assessment Details Patient Name: [name] Date of Service: 03/24/2018 8:00 AM Medical Record Number: [id number] Patient Account Number: [id number] Date of Birth/Sex: November 02, 1961 (57 y.o. Male) Treating RN: Montey Hora Primary Care Shamel Galyean: [name] Other Clinician: Referring Vermelle Cammarata: [name] Treating Aymar Whitfill/Extender: Ricard Dillon Weeks in Treatment: 0 Foot Assessment Items Site Locations + = Sensation present, - = Sensation absent, C = Callus, U = Ulcer R = Redness, W = Warmth, M = Maceration, PU = Pre-ulcerative lesion F = Fissure, S = Swelling, D = Dryness Assessment Right: Left: Other Deformity: No No Prior Foot Ulcer: No No Prior Amputation: No No Charcot Joint: No No Ambulatory Status: Ambulatory Without Help Gait: Unsteady Electronic  Signature(s) Signed: 03/24/2018 5:03:40 PM By: Montey Hora Entered By: Montey Hora on 03/24/2018 08:28:19 [name] ([id number]) -------------------------------------------------------------------------------- Nutrition Risk Assessment Details Patient Name: [name] Date of Service: 03/24/2018 8:00 AM Medical Record Number: [id number] Patient Account Number: [id number] Date of Birth/Sex: 1960-12-22 (57 y.o. Male) Treating RN: Montey Hora Primary Care Aiyanna Awtrey: [name] Other Clinician: Referring Sotirios Navarro: [name] Treating Arvell Pulsifer/Extender: Ricard Dillon Weeks in Treatment: 0 Height (in): Weight (lbs): Body Mass Index (BMI): Nutrition Risk Assessment Items NUTRITION RISK SCREEN: I have an illness or condition that made me change the kind and/or amount of 0 No food I eat I eat fewer than two meals per day 0 No I eat few fruits and vegetables, or milk products 0 No I have three or more drinks of beer, liquor or wine almost every day 0 No I have tooth or mouth problems that make it hard for me to eat 0 No I don't always have enough money to buy the food I need 0 No I eat alone most of the time 0 No I take three or more different prescribed or over-the-counter drugs a day 1 Yes Without wanting to, I have lost or gained 10 pounds in the last six months 0 No I am not always physically able to shop, cook and/or feed myself 0 No Nutrition Protocols Good Risk Protocol 0 No interventions needed Moderate Risk Protocol Electronic Signature(s) Signed: 03/24/2018 5:03:40 PM By: Montey Hora Entered By: Montey Hora on 03/24/2018 08:09:56

## 2018-03-26 NOTE — Progress Notes (Signed)
[name], [name] ([id number]) Visit Report for 03/24/2018 Chief Complaint Document Details Patient Name: [name], [name] Date of Service: 03/24/2018 8:00 AM Medical Record Number: [id number] Patient Account Number: [id number] Date of Birth/Sex: 1961/10/18 (57 y.o. Male) Treating RN: Cornell Barman Primary Care Provider: [name] Other Clinician: Referring Provider: [name] Treating Provider/Extender: Ricard Dillon Weeks in Treatment: 0 Information Obtained from: Patient Chief Complaint 03/24/18; patient is here for small wounds on the right anterior medial and posterior lower calf Electronic Signature(s) Signed: 03/24/2018 5:02:04 PM By: Linton Ham MD Entered By: Linton Ham on 03/24/2018 09:07:13 [name] ([id number]) -------------------------------------------------------------------------------- HPI Details Patient Name: [name] Date of Service: 03/24/2018 8:00 AM Medical Record Number: [id number] Patient Account Number: [id number] Date of Birth/Sex: 04-05-1961 (57 y.o. Male) Treating RN: Cornell Barman Primary Care Provider: [name] Other Clinician: Referring Provider: [name] Treating Provider/Extender: Ricard Dillon Weeks in Treatment: 0 History of Present Illness HPI Description: 03/24/18; patient is a 57 year old type II diabetic on oral agents. He tells me he has right foot drop secondary to previous back surgery had some years ago. He wears a brace on the right leg and walks with a quad cane. He had the brace adjusted in December/18 and apparently did not fit properly and he developed wounds predominantly on the posterior right calf where the brace rubbed his leg. The patient describes this as acute he noticed it right away and was able to have the brace readjusted. He subsequently psoas had some wounds on the anterior right leg which she says are tape abrasion injuries. His primary doctor is Dr. Rex Kras at [facility]. It looks as  though Dr. Rex Kras saw this initially in January. Started him on cephalexin. Patient was seen again on 03/19/17 without much improvement. He has been using Neosporin to the wound areas. He was started on Augmentin on 03/19/18. The patient does not have a wound history. He has a history of a fractured left patella in 2017. Chronic foot drop on the right apparently related to back surgery. Hyperlipidemia and essential hypertension. Electronic Signature(s) Signed: 03/24/2018 5:02:04 PM By: Linton Ham MD Entered By: Linton Ham on 03/24/2018 09:19:02 [name] ([id number]) -------------------------------------------------------------------------------- Physical Exam Details Patient Name: [name] Date of Service: 03/24/2018 8:00 AM Medical Record Number: [id number] Patient Account Number: [id number] Date of Birth/Sex: 09-30-1961 (57 y.o. Male) Treating RN: Cornell Barman Primary Care Provider: [name] Other Clinician: Referring Provider: [name] Treating Provider/Extender: Ricard Dillon Weeks in Treatment: 0 Constitutional Patient is hypertensive.. Pulse regular and within target range for patient.Marland Kitchen Respirations regular, non-labored and within target range.. Temperature is normal and within the target range for the patient.Marland Kitchen appears in no distress. Eyes Conjunctivae clear. No discharge. Respiratory Respiratory effort is easy and symmetric bilaterally. Rate is normal at rest and on room air.. Bilateral breath sounds are clear and equal in all lobes with no wheezes, rales or rhonchi.. Cardiovascular Heart rhythm and rate regular, without murmur or gallop.no evidence of CHF. Femoral arteries palpable on the right. Pedal pulses palpable on the right. Edema present in both extremities.this is nonpitting. I suspect he has lymphedema probably secondary to chronic venous insufficiency. Gastrointestinal (GI) obese but no masses no tenderness. Lymphatic none palpable in  the popliteal or inguinal area. Musculoskeletal no obvious issues with the ankle joint. He does have weakness in dorsiflexion of the right ankle compatible with his foot drop. Integumentary (Hair, Skin) on the right medial ankle area there is a patch  of erythema which is nontender and I suspect represents venous inflammation. Neurological slight loss of vibration sense in the right plantar foot however light touch was normal. Psychiatric No evidence of depression, anxiety, or agitation. Calm, cooperative, and communicative. Appropriate interactions and affect.. Notes wound exam; the patient has a small cluster of wounds on the right anterior lower shin area as well as more posteriorly. The areas posteriorly looks slightly slough covered however the areas anteriorly looks okay. No debridement was required. He looks as though he has threatened areas superiorly in the erythematous area which again I think represents chronic venous inflammation/stasis dermatitis rather than infection at this point. None of the wounds really look all that ominous. There is weeping edema fluid coming through the erythematous area is well Electronic Signature(s) Signed: 03/24/2018 5:02:04 PM By: Linton Ham MD Entered By: Linton Ham on 03/24/2018 09:22:34 [name] ([id number]) -------------------------------------------------------------------------------- Physician Orders Details Patient Name: [name] Date of Service: 03/24/2018 8:00 AM Medical Record Number: [id number] Patient Account Number: [id number] Date of Birth/Sex: 23-Sep-1961 (57 y.o. Male) Treating RN: Cornell Barman Primary Care Provider: [name] Other Clinician: Referring Provider: [name] Treating Provider/Extender: Tito Dine in Treatment: 0 Verbal / Phone Orders: No Diagnosis Coding Wound Cleansing Wound #1 Right,Posterior Lower Leg o Cleanse wound with mild soap and water Wound #2 Right,Anterior  Lower Leg o Cleanse wound with mild soap and water Anesthetic (add to Medication List) Wound #1 Right,Posterior Lower Leg o Topical Lidocaine 4% cream applied to wound bed prior to debridement (In Clinic Only). Wound #2 Right,Anterior Lower Leg o Topical Lidocaine 4% cream applied to wound bed prior to debridement (In Clinic Only). Skin Barriers/Peri-Wound Care Wound #1 Right,Posterior Lower Leg o Triamcinolone Acetonide Ointment (TAC) Wound #2 Right,Anterior Lower Leg o Triamcinolone Acetonide Ointment (TAC) Primary Wound Dressing Wound #1 Right,Posterior Lower Leg o Silver Alginate Wound #2 Right,Anterior Lower Leg o Silver Alginate Secondary Dressing Wound #1 Right,Posterior Lower Leg o Other - Absorptive dressing Wound #2 Right,Anterior Lower Leg o Other - Absorptive dressing Dressing Change Frequency Wound #1 Right,Posterior Lower Leg o Change dressing every week Wound #2 Right,Anterior Lower Leg o Change dressing every week Follow-up Appointments PLEASANT, BENSINGER ([id number]) Wound #1 Right,Posterior Lower Leg o Return Appointment in 1 week. o Nurse Visit as needed Wound #2 Right,Anterior Lower Leg o Return Appointment in 1 week. o Nurse Visit as needed Edema Control Wound #1 Right,Posterior Lower Leg o 4-Layer Compression System - Right Lower Extremity o Elevate legs to the level of the heart and pump ankles as often as possible Wound #2 Right,Anterior Lower Leg o 4-Layer Compression System - Right Lower Extremity o Elevate legs to the level of the heart and pump ankles as often as possible Additional Orders / Instructions Wound #1 Right,Posterior Lower Leg o Activity as tolerated Wound #2 Right,Anterior Lower Leg o Activity as tolerated Patient Medications Allergies: amlodipine Notifications Medication Indication Start End lidocaine DOSE topical 4 % cream - cream topical Electronic Signature(s) Signed: 03/24/2018  1:43:28 PM By: Gretta Cool, BSN, RN, CWS, Kim RN, BSN Signed: 03/24/2018 5:02:04 PM By: Linton Ham MD Entered By: Gretta Cool, BSN, RN, CWS, Kim on 03/24/2018 08:59:08 [name], [name] ([id number]) -------------------------------------------------------------------------------- Prescription 03/24/2018 Patient Name: [name] Provider: Ricard Dillon MD Date of Birth: 1961-06-16 NPI#: [id number] Sex: Male DEA#: [id number] Phone #: [phone] License #: [id number] Patient Address: [facility] [place], [place] [address], [place], [place] [address] [phone] Allergies amlodipine Medication Medication: Route: Strength: Form: lidocaine 4 % topical cream topical 4% cream Class: TOPICAL LOCAL ANESTHETICS Dose: Frequency / Time: Indication: cream topical Number of Refills: Number of Units: 0 Generic Substitution: Start Date: End Date: One Time Use: Substitution Permitted No Note to Pharmacy: Signature(s): Date(s): Electronic Signature(s) Signed: 03/24/2018 1:43:28 PM By: Gretta Cool, BSN, RN, CWS, Kim RN, BSN Signed: 03/24/2018 5:02:04 PM By: Linton Ham MD Entered By: Gretta Cool, BSN, RN, CWS, Kim on 03/24/2018 08:59:08 [name] ([id number]) --------------------------------------------------------------------------------  Problem List Details Patient Name: [name] Date of Service: 03/24/2018 8:00 AM Medical Record Number: [id number] Patient Account Number: [id number] Date of Birth/Sex: 25-Sep-1961 (57 y.o. 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 03/24/2018  Yes of skin I87.331 Chronic venous hypertension (idiopathic) with ulcer and 03/24/2018 Yes inflammation of right lower extremity E11.622 Type 2 diabetes mellitus with other skin ulcer 03/24/2018 Yes I89.0 Lymphedema, not elsewhere classified 03/24/2018 Yes Inactive Problems Resolved Problems Electronic Signature(s) Signed: 03/24/2018 5:02:04 PM By: Linton Ham MD Entered By: Linton Ham on 03/24/2018 09:23:10 [name] ([id number]) -------------------------------------------------------------------------------- Progress Note Details Patient Name: [name] Date of Service: 03/24/2018 8:00 AM Medical Record Number: [id number] Patient Account Number: [id number] Date of Birth/Sex: [birth year] (57 y.o. Male) Treating RN: Cornell Barman Primary Care Provider: [name] Other Clinician: Referring Provider: [name] Treating Provider/Extender: Ricard Dillon Weeks in Treatment: 0 Subjective Chief Complaint Information obtained from Patient 03/24/18; patient is here for small wounds on the right anterior medial and posterior lower calf History of Present Illness (HPI) 03/24/18; patient is a 57 year old type II diabetic on oral agents. He tells me he has right foot drop secondary to previous back surgery had some years ago. He wears a brace on the right leg and walks with a quad cane. He had the brace adjusted in December/18 and apparently did not fit properly and he developed wounds predominantly on the posterior right calf where the brace rubbed his leg. The patient describes this as acute he noticed it right away and was able to have the brace readjusted. He subsequently psoas had some wounds on the anterior right leg which she says are tape abrasion injuries. His primary doctor is Dr. Rex Kras at [facility]. It looks as though Dr. Rex Kras saw this initially in January. Started him on cephalexin. Patient was seen again on 03/19/17 without much improvement. He has been  using Neosporin to the wound areas. He was started on Augmentin on 03/19/18. The patient does not have a wound history. He has a history of a fractured left patella in 2017. Chronic foot drop on the right apparently related to back surgery. Hyperlipidemia and essential hypertension. Wound History Patient presents with 1 open wound that has been present for approximately 5 months. Patient has been treating wound in the following manner: neosporin. Laboratory tests have not been performed in the last month. Patient reportedly has not tested positive for an antibiotic resistant organism. Patient reportedly has not tested positive for osteomyelitis. Patient reportedly has not had testing performed to evaluate circulation in the legs. Patient History Information obtained from Patient. Allergies amlodipine Family History Cancer - Father, Diabetes - Siblings,Mother, Hypertension - Father, Lung Disease - Father, No family  history of Heart Disease, Hereditary Spherocytosis, Kidney Disease, Seizures, Stroke, Thyroid Problems, Tuberculosis. Social History Never smoker, Marital Status - Widowed, Alcohol Use - Never, Drug Use - No History, Caffeine Use - Daily. Medical History Hematologic/Lymphatic Denies history of Anemia, Hemophilia, Human Immunodeficiency Virus, Lymphedema, Sickle Cell Disease Respiratory Denies history of Aspiration, Asthma, Chronic Obstructive Pulmonary Disease (COPD), Pneumothorax, Sleep Apnea, Tuberculosis Cardiovascular [name], [name]. ([id number]) Patient has history of Hypertension Denies history of Angina, Arrhythmia, Congestive Heart Failure, Coronary Artery Disease, Deep Vein Thrombosis, Hypotension, Myocardial Infarction, Peripheral Arterial Disease, Peripheral Venous Disease, Phlebitis, Vasculitis Gastrointestinal Denies history of Cirrhosis , Colitis, Crohn s, Hepatitis A, Hepatitis B, Hepatitis C Endocrine Patient has history of Type II  Diabetes Immunological Denies history of Lupus Erythematosus, Raynaud s, Scleroderma Musculoskeletal Denies history of Gout, Rheumatoid Arthritis, Osteoarthritis, Osteomyelitis Neurologic Patient has history of Neuropathy Denies history of Dementia, Quadriplegia, Paraplegia, Seizure Disorder Oncologic Denies history of Received Chemotherapy, Received Radiation Patient is treated with Oral Agents. Medical And Surgical History Notes Musculoskeletal morbid obesity Review of Systems (ROS) Constitutional Symptoms (General Health) The patient has no complaints or symptoms. Eyes The patient has no complaints or symptoms. Ear/Nose/Mouth/Throat The patient has no complaints or symptoms. Hematologic/Lymphatic The patient has no complaints or symptoms. Respiratory The patient has no complaints or symptoms. Cardiovascular The patient has no complaints or symptoms. Gastrointestinal The patient has no complaints or symptoms. Genitourinary The patient has no complaints or symptoms. Immunological The patient has no complaints or symptoms. Integumentary (Skin) The patient has no complaints or symptoms. Musculoskeletal The patient has no complaints or symptoms. Neurologic The patient has no complaints or symptoms. Oncologic The patient has no complaints or symptoms. Psychiatric The patient has no complaints or symptoms. [name], [name] ([id number]) Objective Constitutional Patient is hypertensive.. Pulse regular and within target range for patient.Marland Kitchen Respirations regular, non-labored and within target range.. Temperature is normal and within the target range for the patient.Marland Kitchen appears in no distress. Vitals Time Taken: 8:16 AM, Height: 75 in, Source: Measured, Weight: 343 lbs, Source: Measured, BMI: 42.9, Temperature: 98.2 F, Pulse: 94 bpm, Respiratory Rate: 18 breaths/min, Blood Pressure: 154/82 mmHg. Eyes Conjunctivae clear. No discharge. Respiratory Respiratory effort is easy and  symmetric bilaterally. Rate is normal at rest and on room air.. Bilateral breath sounds are clear and equal in all lobes with no wheezes, rales or rhonchi.. Cardiovascular Heart rhythm and rate regular, without murmur or gallop.no evidence of CHF. Femoral arteries palpable on the right. Pedal pulses palpable on the right. Edema present in both extremities.this is nonpitting. I suspect he has lymphedema probably secondary to chronic venous insufficiency. Gastrointestinal (GI) obese but no masses no tenderness. Lymphatic none palpable in the popliteal or inguinal area. Musculoskeletal no obvious issues with the ankle joint. He does have weakness in dorsiflexion of the right ankle compatible with his foot drop. Neurological slight loss of vibration sense in the right plantar foot however light touch was normal. Psychiatric No evidence of depression, anxiety, or agitation. Calm, cooperative, and communicative. Appropriate interactions and affect.. General Notes: wound exam; the patient has a small cluster of wounds on the right anterior lower shin area as well as more posteriorly. The areas posteriorly looks slightly slough covered however the areas anteriorly looks okay. No debridement was required. He looks as though he has threatened areas superiorly in the erythematous area which again I think represents chronic venous inflammation/stasis dermatitis rather than infection at this point. None of the wounds really look all that ominous. There  is weeping edema fluid coming through the erythematous area is well Integumentary (Hair, Skin) on the right medial ankle area there is a patch of erythema which is nontender and I suspect represents venous inflammation. Wound #1 status is Open. Original cause of wound was Trauma. The wound is located on the Right,Posterior Lower Leg. The wound measures 9.6cm length x 3.2cm width x 0.1cm depth; 24.127cm^2 area and 2.413cm^3 volume. There is Fat  Layer (Subcutaneous Tissue) Exposed exposed. There is no tunneling or undermining noted. There is a large amount of serous drainage noted. The wound margin is flat and intact. There is medium (34-66%) red granulation within the wound bed. There is a medium (34-66%) amount of necrotic tissue within the wound bed including Adherent Slough. The periwound skin appearance exhibited: Excoriation, Hemosiderin Staining. The periwound skin appearance did not exhibit: Callus, Crepitus, Induration, Rash, Scarring, Dry/Scaly, Maceration, Atrophie Blanche, Cyanosis, Ecchymosis, Mottled, Pallor, Rubor, Erythema. Periwound temperature was noted as No Abnormality. Wound #2 status is Open. Original cause of wound was Trauma. The wound is located on the Right,Anterior Lower Leg. The wound measures 2.2cm length x 2.4cm width x 0.1cm depth; 4.147cm^2 area and 0.415cm^3 volume. There is no tunneling [name], Christopher K. ([id number]) or undermining noted. There is a large amount of serous drainage noted. The wound margin is flat and intact. There is large (67-100%) red granulation within the wound bed. There is a small (1-33%) amount of necrotic tissue within the wound bed including Adherent Slough. The periwound skin appearance exhibited: Excoriation, Hemosiderin Staining. The periwound skin appearance did not exhibit: Callus, Crepitus, Induration, Rash, Scarring, Dry/Scaly, Maceration, Atrophie Blanche, Cyanosis, Ecchymosis, Mottled, Pallor, Rubor, Erythema. Periwound temperature was noted as No Abnormality. Assessment Active Problems ICD-10 L97.211 - Non-pressure chronic ulcer of right calf limited to breakdown of skin I87.331 - Chronic venous hypertension (idiopathic) with ulcer and inflammation of right lower extremity E11.622 - Type 2 diabetes mellitus with other skin ulcer I89.0 - Lymphedema, not elsewhere classified Procedures Wound #1 Pre-procedure diagnosis of Wound #1 is a Diabetic Wound/Ulcer of the Lower  Extremity located on the Right,Posterior Lower Leg . There was a Four Layer Compression Therapy Procedure with a pre-treatment ABI of 1.1 by Cornell Barman, RN. Post procedure Diagnosis Wound #1: Same as Pre-Procedure Plan Wound Cleansing: Wound #1 Right,Posterior Lower Leg: Cleanse wound with mild soap and water Wound #2 Right,Anterior Lower Leg: Cleanse wound with mild soap and water Anesthetic (add to Medication List): Wound #1 Right,Posterior Lower Leg: Topical Lidocaine 4% cream applied to wound bed prior to debridement (In Clinic Only). Wound #2 Right,Anterior Lower Leg: Topical Lidocaine 4% cream applied to wound bed prior to debridement (In Clinic Only). Skin Barriers/Peri-Wound Care: Wound #1 Right,Posterior Lower Leg: Triamcinolone Acetonide Ointment (TAC) Wound #2 Right,Anterior Lower Leg: Triamcinolone Acetonide Ointment (TAC) Primary Wound Dressing: Wound #1 Right,Posterior Lower Leg: Silver Alginate Wound #2 Right,Anterior Lower Leg: Silver Alginate [name], [name] ([id number]) Secondary Dressing: Wound #1 Right,Posterior Lower Leg: Other - Absorptive dressing Wound #2 Right,Anterior Lower Leg: Other - Absorptive dressing Dressing Change Frequency: Wound #1 Right,Posterior Lower Leg: Change dressing every week Wound #2 Right,Anterior Lower Leg: Change dressing every week Follow-up Appointments: Wound #1 Right,Posterior Lower Leg: Return Appointment in 1 week. Nurse Visit as needed Wound #2 Right,Anterior Lower Leg: Return Appointment in 1 week. Nurse Visit as needed Edema Control: Wound #1 Right,Posterior Lower Leg: 4-Layer Compression System - Right Lower Extremity Elevate legs to the level of the heart and pump ankles as often as  possible Wound #2 Right,Anterior Lower Leg: 4-Layer Compression System - Right Lower Extremity Elevate legs to the level of the heart and pump ankles as often as possible Additional Orders / Instructions: Wound #1 Right,Posterior  Lower Leg: Activity as tolerated Wound #2 Right,Anterior Lower Leg: Activity as tolerated The following medication(s) was prescribed: lidocaine topical 4 % cream cream topical #1 and incus patient has chronic venous insufficiency with venous inflammation/stasis dermatitis and secondary lymphedema. #2 at this point the erythema area on the medial leg is nontender. I think he has chronic inflammation in this area rather than infection although I'm not denying that he could've had cellulitis in this area very easily #3 although the wounds appear superficial. The areas posteriorly have it less viable looking surface although I thought I would forego debridement this week and have a look at this after a week of compression #4 we apply silver alginate, secondary absorptive dressings as well as 4-layer compression. He is going to leave this on a week I am hopeful that these wounds were closed with simple control of the edema. #5 he may require graded pressure stockings going forward although this is his first round of wounds in this area. Venous reflux study is also come to mind although again this is a patient who is not had spontaneous skin breakdown in this area. This is certainly something that could be considered going forward #6 I don't believe he has an arterial issue. No evidence of fluid overload Electronic Signature(s) Signed: 03/24/2018 5:02:04 PM By: Linton Ham MD Entered By: Linton Ham on 03/24/2018 09:26:39 [name] ([id number]) -------------------------------------------------------------------------------- ROS/PFSH Details Patient Name: [name] Date of Service: 03/24/2018 8:00 AM Medical Record Number: [id number] Patient Account Number: [id number] Date of Birth/Sex: 11-30-1961 (57 y.o. Male) Treating RN: Montey Hora Primary Care Provider: [name] Other Clinician: Referring Provider: [name] Treating Provider/Extender: Tito Dine  in Treatment: 0 Information Obtained From Patient Wound History Do you currently have one or more open woundso Yes How many open wounds do you currently haveo 1 Approximately how long have you had your woundso 5 months How have you been treating your wound(s) until nowo neosporin Has your wound(s) ever healed and then re-openedo No Have you had any lab work done in the past montho No Have you tested positive for an antibiotic resistant organism (MRSA, VRE)o No Have you tested positive for osteomyelitis (bone infection)o No Have you had any tests for circulation on your legso No Constitutional Symptoms (General Health) Complaints and Symptoms: No Complaints or Symptoms Eyes Complaints and Symptoms: No Complaints or Symptoms Ear/Nose/Mouth/Throat Complaints and Symptoms: No Complaints or Symptoms Hematologic/Lymphatic Complaints and Symptoms: No Complaints or Symptoms Medical History: Negative for: Anemia; Hemophilia; Human Immunodeficiency Virus; Lymphedema; Sickle Cell Disease Respiratory Complaints and Symptoms: No Complaints or Symptoms Medical History: Negative for: Aspiration; Asthma; Chronic Obstructive Pulmonary Disease (COPD); Pneumothorax; Sleep Apnea; Tuberculosis Cardiovascular Complaints and Symptoms: No Complaints or Symptoms [name], ORRE[place]. ([id number]) Medical History: Positive for: Hypertension Negative for: Angina; Arrhythmia; Congestive Heart Failure; Coronary Artery Disease; Deep Vein Thrombosis; Hypotension; Myocardial Infarction; Peripheral Arterial Disease; Peripheral Venous Disease; Phlebitis; Vasculitis Gastrointestinal Complaints and Symptoms: No Complaints or Symptoms Medical History: Negative for: Cirrhosis ; Colitis; Crohnos; Hepatitis A; Hepatitis B; Hepatitis C Endocrine Medical History: Positive for: Type II Diabetes Treated with: Oral agents Genitourinary Complaints and Symptoms: No Complaints or Symptoms Immunological Complaints and  Symptoms: No Complaints or Symptoms Medical History: Negative for: Lupus Erythematosus; Raynaudos; Scleroderma Integumentary (Skin)  Complaints and Symptoms: No Complaints or Symptoms Musculoskeletal Complaints and Symptoms: No Complaints or Symptoms Medical History: Negative for: Gout; Rheumatoid Arthritis; Osteoarthritis; Osteomyelitis Past Medical History Notes: morbid obesity Neurologic Complaints and Symptoms: No Complaints or Symptoms Medical History: Positive for: Neuropathy Negative for: Dementia; Quadriplegia; Paraplegia; Seizure Disorder Oncologic [name], [name] ([id number]) Complaints and Symptoms: No Complaints or Symptoms Medical History: Negative for: Received Chemotherapy; Received Radiation Psychiatric Complaints and Symptoms: No Complaints or Symptoms Immunizations Pneumococcal Vaccine: Received Pneumococcal Vaccination: Yes Implantable Devices Family and Social History Cancer: Yes - Father; Diabetes: Yes - Siblings,Mother; Heart Disease: No; Hereditary Spherocytosis: No; Hypertension: Yes - Father; Kidney Disease: No; Lung Disease: Yes - Father; Seizures: No; Stroke: No; Thyroid Problems: No; Tuberculosis: No; Never smoker; Marital Status - Widowed; Alcohol Use: Never; Drug Use: No History; Caffeine Use: Daily; Financial Concerns: No; Food, Clothing or Shelter Needs: No; Support System Lacking: No; Transportation Concerns: No; Advanced Directives: No; Patient does not want information on Advanced Directives Electronic Signature(s) Signed: 03/24/2018 5:02:04 PM By: Linton Ham MD Signed: 03/24/2018 5:03:40 PM By: Montey Hora Entered By: Montey Hora on 03/24/2018 08:16:03 [name] ([id number]) -------------------------------------------------------------------------------- SuperBill Details Patient Name: [name] Date of Service: 03/24/2018 Medical Record Number: [id number] Patient Account Number: [id number] Date of Birth/Sex:  [birth year] (57 y.o. 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 Chronic venous hypertension (idiopathic) with ulcer and inflammation of right lower extremity E11.622 Type 2 diabetes mellitus with other skin ulcer I89.0 Lymphedema, not elsewhere classified Facility Procedures CPT4 Code Description: [id number] - WOUND CARE VISIT-LEV 3 EST PT Modifier: Quantity: 1 CPT4 Code Description: [id number] (Facility Use Only) [id number] - [place] RT LEG Modifier: Quantity: 1 Physician Procedures CPT4: Description Modifier Quantity Code [id number] - WC PHYS LEVEL 4 - NEW PT 1 ICD-10 Diagnosis Description L97.211 Non-pressure chronic ulcer of right calf limited to breakdown of skin I87.331 Chronic venous hypertension (idiopathic) with ulcer and  inflammation of right lower extremity E11.622 Type 2 diabetes mellitus with other skin ulcer I89.0 Lymphedema, not elsewhere classified Electronic Signature(s) Signed: 03/24/2018 5:02:04 PM By: Linton Ham MD Entered By: Linton Ham on 03/24/2018 09:28:09

## 2018-03-29 DIAGNOSIS — L97211 Non-pressure chronic ulcer of right calf limited to breakdown of skin: Secondary | ICD-10-CM | POA: Diagnosis not present

## 2018-03-29 DIAGNOSIS — M21371 Foot drop, right foot: Secondary | ICD-10-CM | POA: Diagnosis not present

## 2018-03-29 DIAGNOSIS — E1140 Type 2 diabetes mellitus with diabetic neuropathy, unspecified: Secondary | ICD-10-CM | POA: Diagnosis not present

## 2018-03-29 DIAGNOSIS — I890 Lymphedema, not elsewhere classified: Secondary | ICD-10-CM | POA: Diagnosis not present

## 2018-03-29 DIAGNOSIS — I87331 Chronic venous hypertension (idiopathic) with ulcer and inflammation of right lower extremity: Secondary | ICD-10-CM | POA: Diagnosis not present

## 2018-03-29 DIAGNOSIS — E11622 Type 2 diabetes mellitus with other skin ulcer: Secondary | ICD-10-CM | POA: Diagnosis not present

## 2018-03-29 DIAGNOSIS — Z6841 Body Mass Index (BMI) 40.0 and over, adult: Secondary | ICD-10-CM | POA: Diagnosis not present

## 2018-03-29 DIAGNOSIS — I872 Venous insufficiency (chronic) (peripheral): Secondary | ICD-10-CM | POA: Diagnosis not present

## 2018-03-29 DIAGNOSIS — I10 Essential (primary) hypertension: Secondary | ICD-10-CM | POA: Diagnosis not present

## 2018-03-31 ENCOUNTER — Encounter: Payer: Medicare HMO | Admitting: Internal Medicine

## 2018-03-31 DIAGNOSIS — I10 Essential (primary) hypertension: Secondary | ICD-10-CM | POA: Diagnosis not present

## 2018-03-31 DIAGNOSIS — L539 Erythematous condition, unspecified: Secondary | ICD-10-CM | POA: Diagnosis not present

## 2018-03-31 DIAGNOSIS — S81801A Unspecified open wound, right lower leg, initial encounter: Secondary | ICD-10-CM | POA: Diagnosis not present

## 2018-03-31 DIAGNOSIS — E11622 Type 2 diabetes mellitus with other skin ulcer: Secondary | ICD-10-CM | POA: Diagnosis not present

## 2018-03-31 DIAGNOSIS — L97212 Non-pressure chronic ulcer of right calf with fat layer exposed: Secondary | ICD-10-CM | POA: Diagnosis not present

## 2018-03-31 DIAGNOSIS — M21371 Foot drop, right foot: Secondary | ICD-10-CM | POA: Diagnosis not present

## 2018-03-31 DIAGNOSIS — E1140 Type 2 diabetes mellitus with diabetic neuropathy, unspecified: Secondary | ICD-10-CM | POA: Diagnosis not present

## 2018-03-31 DIAGNOSIS — I872 Venous insufficiency (chronic) (peripheral): Secondary | ICD-10-CM | POA: Diagnosis not present

## 2018-03-31 DIAGNOSIS — L97211 Non-pressure chronic ulcer of right calf limited to breakdown of skin: Secondary | ICD-10-CM | POA: Diagnosis not present

## 2018-03-31 DIAGNOSIS — Z6841 Body Mass Index (BMI) 40.0 and over, adult: Secondary | ICD-10-CM | POA: Diagnosis not present

## 2018-03-31 DIAGNOSIS — L309 Dermatitis, unspecified: Secondary | ICD-10-CM | POA: Diagnosis not present

## 2018-03-31 DIAGNOSIS — I890 Lymphedema, not elsewhere classified: Secondary | ICD-10-CM | POA: Diagnosis not present

## 2018-03-31 DIAGNOSIS — I87331 Chronic venous hypertension (idiopathic) with ulcer and inflammation of right lower extremity: Secondary | ICD-10-CM | POA: Diagnosis not present

## 2018-04-02 NOTE — Progress Notes (Addendum)
STALEY, LUNZ ([id number]) Visit Report for 03/29/2018 Arrival Information Details Patient Name: [name], [name] Date of Service: 03/29/2018 11:00 AM Medical Record Number: [id number] Patient Account Number: [id number] Date of Birth/Sex: [birth year] (57 y.o. M) Treating RN: Montey Hora Primary Care Wladyslaw Henrichs: [name] Other Clinician: Referring Conn Trombetta: [name] Treating Reathel Turi/Extender: Melburn Hake, HOYT Weeks in Treatment: 0 Visit Information History Since Last Visit Added or deleted any medications: No Patient Arrived: Ambulatory Any new allergies or adverse reactions: No Arrival Time: 11:32 Had a fall or experienced change in No Accompanied By: self activities of daily living that may affect Transfer Assistance: None risk of falls: Patient Identification Verified: Yes Signs or symptoms of abuse/neglect since last visito No Secondary Verification Process Completed: Yes Hospitalized since last visit: No Patient Has Alerts: Yes Implantable device outside of the clinic excluding No Patient Alerts: DMII cellular tissue based products placed in the center since last visit: Has Dressing in Place as Prescribed: Yes Has Compression in Place as Prescribed: Yes Pain Present Now: No Electronic Signature(s) Signed: 03/29/2018 12:04:05 PM By: Montey Hora Entered By: Montey Hora on 03/29/2018 12:04:05 [name] ([id number]) -------------------------------------------------------------------------------- Compression Therapy Details Patient Name: [name] Date of Service: 03/29/2018 11:00 AM Medical Record Number: [id number] Patient Account Number: [id number] Date of Birth/Sex: 1961-06-22 (57 y.o. M) Treating RN: Montey Hora Primary Care Klayton Monie: [name] Other Clinician: Referring Micky Sheller: [name] Treating Kinisha Soper/Extender: Melburn Hake, HOYT Weeks in Treatment: 0 Compression Therapy Performed for Wound Assessment: Wound #1 Right,Posterior  Lower Leg Performed By: Clinician Montey Hora, RN Compression Type: Four Layer Pre Treatment ABI: 1.1 Electronic Signature(s) Signed: 03/29/2018 12:05:22 PM By: Montey Hora Entered By: Montey Hora on 03/29/2018 12:05:21 [name] ([id number]) -------------------------------------------------------------------------------- Compression Therapy Details Patient Name: [name] Date of Service: 03/29/2018 11:00 AM Medical Record Number: [id number] Patient Account Number: [id number] Date of Birth/Sex: [birth year] (57 y.o. M) Treating RN: Montey Hora Primary Care Rykar Lebleu: [name] Other Clinician: Referring Sladen Plancarte: [name] Treating Idriss Quackenbush/Extender: Melburn Hake, HOYT Weeks in Treatment: 0 Compression Therapy Performed for Wound Assessment: Wound #2 Right,Anterior Lower Leg Performed By: Clinician Montey Hora, RN Compression Type: Four Layer Pre Treatment ABI: 1.1 Electronic Signature(s) Signed: 03/29/2018 12:05:22 PM By: Montey Hora Entered By: Montey Hora on 03/29/2018 12:05:22 [name] ([id number]) -------------------------------------------------------------------------------- Encounter Discharge Information Details Patient Name: [name] Date of Service: 03/29/2018 11:00 AM Medical Record Number: [id number] Patient Account Number: [id number] Date of Birth/Sex: Mar 31, 1961 (57 y.o. M) Treating RN: Montey Hora Primary Care Bebe Moncure: [name] Other Clinician: Referring Breah Joa: [name] Treating Valdemar Mcclenahan/Extender: Melburn Hake, HOYT Weeks in Treatment: 0 Encounter Discharge Information Items Discharge Pain Level: 0 Discharge Condition: Stable Ambulatory Status: Ambulatory Discharge Destination: Home Private Transportation: Auto Accompanied By: self Schedule Follow-up Appointment: Yes Medication Reconciliation completed and No provided to Patient/Care Osamu Olguin: Clinical Summary of Care: Electronic  Signature(s) Signed: 03/29/2018 12:06:19 PM By: Montey Hora Entered By: Montey Hora on 03/29/2018 12:06:19 [name] ([id number]) -------------------------------------------------------------------------------- Patient/Caregiver Education Details Patient Name: [name] Date of Service: 03/29/2018 11:00 AM Medical Record Number: [id number] Patient Account Number: [id number] Date of Birth/Gender: 12-May-1961 (57 y.o. M) Treating RN: Montey Hora Primary Care Physician: [name] Other Clinician: Referring Physician: [name] Treating Physician/Extender: Sharalyn Ink in Treatment: 0 Education Assessment Education Provided To: Patient Education Topics Provided Venous: Handouts: Other: leg elevation Methods: Explain/Verbal Responses: State content correctly Electronic Signature(s) Signed: 03/29/2018 4:05:33 PM By: Montey Hora Entered By: Montey Hora on  03/29/2018 12:06:07 FRANTZ, QUATTRONE ([id number]) -------------------------------------------------------------------------------- Wound Assessment Details Patient Name: [name] Date of Service: 03/29/2018 11:00 AM Medical Record Number: [id number] Patient Account Number: [id number] Date of Birth/Sex: Sep 21, 1961 (57 y.o. 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 - Posterior Secondary Venous Leg Ulcer Wounding Event: Trauma Etiology: Date Acquired: 11/23/2017 Wound Status: Open Weeks Of Treatment: 0 Comorbid History: Hypertension, Type II Diabetes, Clustered Wound: No Neuropathy Photos Photo Uploaded By: Montey Hora on 03/29/2018 15:17:14 Wound Measurements Length: (cm) 9.6 Width: (cm) 3.2 Depth: (cm) 0.1 Area:  (cm) 24.127 Volume: (cm) 2.413 % Reduction in Area: 0% % Reduction in Volume: 0% Epithelialization: Small (1-33%) Tunneling: No Undermining: No Wound Description Classification: Grade 1 Wound Margin: Flat and Intact Exudate Amount: Large Exudate Type: Serous Exudate Color: amber Foul Odor After Cleansing: No Slough/Fibrino Yes Wound Bed Granulation Amount: Medium (34-66%) Exposed Structure Granulation Quality: Red Fascia Exposed: No Necrotic Amount: Medium (34-66%) Fat Layer (Subcutaneous Tissue) Exposed: Yes Necrotic Quality: Adherent Slough Tendon Exposed: No Muscle Exposed: No Joint Exposed: No Bone Exposed: No Periwound Skin Texture Bartol, Blane K. ([id number]) Texture Color No Abnormalities Noted: No No Abnormalities Noted: No Callus: No Atrophie Blanche: No Crepitus: No Cyanosis: No Excoriation: Yes Ecchymosis: No Induration: No Erythema: No Rash: No Hemosiderin Staining: Yes Scarring: No Mottled: No Pallor: No Moisture Rubor: No No Abnormalities Noted: No Dry / Scaly: No Temperature / Pain Maceration: No Temperature: No Abnormality Wound Preparation Ulcer Cleansing: Rinsed/Irrigated with Saline, Other: soap and water, Topical Anesthetic Applied: None Electronic Signature(s) Signed: 03/29/2018 12:04:40 PM By: Montey Hora Entered By: Montey Hora on 03/29/2018 12:04:39 [name] ([id number]) -------------------------------------------------------------------------------- Wound Assessment Details Patient Name: [name] Date of Service: 03/29/2018 11:00 AM Medical Record Number: [id number] Patient Account Number: [id number] Date of Birth/Sex: [birth year] (57 y.o. M) Treating RN: Montey Hora Primary Care Zadin Lange: [name] Other Clinician: Referring Sherrel Shafer: [name] Treating Charlsey Moragne/Extender: STONE III, HOYT Weeks in Treatment: 0 Wound Status Wound Number: 2 Primary Etiology: Diabetic Wound/Ulcer of the  Lower Extremity Wound Location: Right Lower Leg - Anterior Secondary Venous Leg Ulcer Wounding Event: Trauma Etiology: Date Acquired: 11/23/2017 Wound Status: Open Weeks Of Treatment: 0 Comorbid History: Hypertension, Type II Diabetes, Clustered Wound: No Neuropathy Photos Photo Uploaded By: Montey Hora on 03/29/2018 15:17:14 Wound Measurements Length: (cm) 2.2 Width: (cm) 2.4 Depth: (cm) 0.1 Area: (cm) 4.147 Volume: (cm) 0.415 % Reduction in Area: 0% % Reduction in Volume: 0% Epithelialization: Small (1-33%) Tunneling: No Undermining: No Wound Description Classification: Grade 1 Wound Margin: Flat and Intact Exudate Amount: Large Exudate Type: Serous Exudate Color: amber Foul Odor After Cleansing: No Slough/Fibrino Yes Wound Bed Granulation Amount: Large (67-100%) Exposed Structure Granulation Quality: Red Fascia Exposed: No Necrotic Amount: Small (1-33%) Fat Layer (Subcutaneous Tissue) Exposed: No Necrotic Quality: Adherent Slough Tendon Exposed: No Muscle Exposed: No Joint Exposed: No Bone Exposed: No Periwound Skin Texture Gambrell, Jatavius K. ([id number]) Texture Color No Abnormalities Noted: No No Abnormalities Noted: No Callus: No Atrophie Blanche: No Crepitus: No Cyanosis: No Excoriation: Yes Ecchymosis: No Induration: No Erythema: No Rash: No Hemosiderin Staining: Yes Scarring: No Mottled: No Pallor: No Moisture Rubor: No No Abnormalities Noted: No Dry / Scaly: No Temperature / Pain Maceration: No Temperature: No Abnormality Wound Preparation Ulcer Cleansing: Rinsed/Irrigated with Saline,  Other: soap and water, Topical Anesthetic Applied: None Electronic Signature(s) Signed: 03/29/2018 12:04:57 PM By: Montey Hora Entered By: Montey Hora on 03/29/2018 12:04:56

## 2018-04-05 NOTE — Progress Notes (Signed)
Hines, Christopher ([id number]) Visit Report for 03/31/2018 HPI Details Patient Name: [name], [name] Date of Service: 03/31/2018 10:15 AM Medical Record Number: [id number] Patient Account Number: [id number] Date of Birth/Sex: July 01, 1961 (57 y.o. M) Treating RN: Cornell Barman Primary Care Provider: [name] Other Clinician: Referring Provider: [name] Treating Provider/Extender: Tito Dine in Treatment: 1 History of Present Illness HPI Description: 03/24/18; patient is a 57 year old type II diabetic on oral agents. He tells me he has right foot drop secondary to previous back surgery had some years ago. He wears a brace on the right leg and walks with a quad cane. He had the brace adjusted in December/18 and apparently did not fit properly and he developed wounds predominantly on the posterior right calf where the brace rubbed his leg. The patient describes this as acute he noticed it right away and was able to have the brace readjusted. He subsequently psoas had some wounds on the anterior right leg which she says are tape abrasion injuries. His primary doctor is Dr. Rex Kras at [facility]. It looks as though Dr. Rex Kras saw this initially in January. Started him on cephalexin. Patient was seen again on 03/19/17 without much improvement. He has been using Neosporin to the wound areas. He was started on Augmentin on 03/19/18. The patient does not have a wound history. He has a history of a fractured left patella in 2017. Chronic foot drop on the right apparently related to back surgery. Hyperlipidemia and essential hypertension. 03/24/18; the patient still has a scattering of wounds on the anterior medial extending posteriorly in the right leg. The advertising as this is being secondary to abrasion injury although there is a lot of irritation here and one would have to wonder whether this represents chronic venous inflammation/stasis dermatitis. In any case of that's true it was  not well recognized. 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 He is completing Augmentin at this point I don't think he needs additional antibiotics. we applied silver alginate under 4-layer compression which the patient tolerated well Electronic Signature(s) Signed: 03/31/2018 5:09:03 PM By: Linton Ham MD Entered By: Linton Ham on 03/31/2018 11:10:18 [name] ([id number]) -------------------------------------------------------------------------------- Physical Exam Details Patient Name: [name] Date of Service: 03/31/2018 10:15 AM Medical Record Number: [id number] Patient Account Number: [id number] Date of Birth/Sex: [birth year] (57 y.o. M) Treating RN: Cornell Barman Primary Care Provider: [name] Other Clinician: Referring Provider: [name] Treating Provider/Extender: Tito Dine in Treatment: 1 Constitutional Patient is hypertensive.. Pulse regular and within target range for patient.Marland Kitchen Respirations regular, non-labored and within target range.. Temperature is normal and within the target range for the patient.Marland Kitchen appears in no distress. Eyes Conjunctivae clear. No discharge. Respiratory Respiratory effort is easy and symmetric bilaterally. Rate is normal at rest and on room air.. Cardiovascular Pedal pulses palpable and strong bilaterally.Marland Kitchen edema control is much better in the right leg under compression. Changes of chronic venous insufficiency and secondary lymphedema in the left leg. Lymphatic none palpable in the popliteal area bilaterally. Integumentary (Hair, Skin) an exacerbation probably has chronic venous inflammation in the area of the right medial lower calf. There is no tenderness here in the warmth I'm doubtful currently of infection. Psychiatric No evidence of depression, anxiety, or agitation. Calm, cooperative, and communicative. Appropriate interactions and affect.. Notes wound exam; the patient has  wounds on the right anterior leg last week a small cluster these appear better. Ears medially and posteriorly are about the same.  I think he has chronic venous inflammation with erythema and edema but without warmth or palpable tenderness. Not as much weeping edema fluid Electronic Signature(s) Signed: 03/31/2018 5:09:03 PM By: Linton Ham MD Entered By: Linton Ham on 03/31/2018 11:13:27 [name] ([id number]) -------------------------------------------------------------------------------- Physician Orders Details Patient Name: [name] Date of Service: 03/31/2018 10:15 AM Medical Record Number: [id number] Patient Account Number: [id number] Date of Birth/Sex: 10-Feb-1961 (57 y.o. M) Treating RN: Cornell Barman Primary Care Provider: [name] Other Clinician: Referring Provider: [name] Treating Provider/Extender: Tito Dine in Treatment: 1 Verbal / Phone Orders: No Diagnosis Coding Wound Cleansing Wound #1 Right,Posterior Lower Leg o Cleanse wound with mild soap and water Wound #2 Right,Anterior Lower Leg o Cleanse wound with mild soap and water Anesthetic (add to Medication List) Wound #1 Right,Posterior Lower Leg o Topical Lidocaine 4% cream applied to wound bed prior to debridement (In Clinic Only). Wound #2 Right,Anterior Lower Leg o Topical Lidocaine 4% cream applied to wound bed prior to debridement (In Clinic Only). Skin Barriers/Peri-Wound Care Wound #1 Right,Posterior Lower Leg o Triamcinolone Acetonide Ointment (TAC) Wound #2 Right,Anterior Lower Leg o Triamcinolone Acetonide Ointment (TAC) Primary Wound Dressing Wound #1 Right,Posterior Lower Leg o Silver Alginate Wound #2 Right,Anterior Lower Leg o Silver Alginate Secondary Dressing Wound #1 Right,Posterior Lower Leg o Other - Absorptive dressing Wound #2 Right,Anterior Lower Leg o Other - Absorptive dressing Dressing Change Frequency Wound #1  Right,Posterior Lower Leg o Change dressing every week Wound #2 Right,Anterior Lower Leg o Change dressing every week Follow-up Appointments ADE, STMARIE ([id number]) Wound #1 Right,Posterior Lower Leg o Return Appointment in 1 week. o Nurse Visit as needed Wound #2 Right,Anterior Lower Leg o Return Appointment in 1 week. o Nurse Visit as needed Edema Control Wound #1 Right,Posterior Lower Leg o 4-Layer Compression System - Right Lower Extremity o Elevate legs to the level of the heart and pump ankles as often as possible Wound #2 Right,Anterior Lower Leg o 4-Layer Compression System - Right Lower Extremity o Elevate legs to the level of the heart and pump ankles as often as possible Additional Orders / Instructions Wound #1 Right,Posterior Lower Leg o Activity as tolerated Wound #2 Right,Anterior Lower Leg o Activity as tolerated Electronic Signature(s) Signed: 03/31/2018 5:09:03 PM By: Linton Ham MD Signed: 03/31/2018 5:11:52 PM By: Gretta Cool, BSN, RN, CWS, Kim RN, BSN Entered By: Gretta Cool, BSN, RN, CWS, Kim on 03/31/2018 10:42:21 CYPRUS, KUANG ([id number]) -------------------------------------------------------------------------------- Problem List Details Patient Name: [name] Date of Service: 03/31/2018 10:15 AM Medical Record Number: [id number] Patient Account Number: [id number] Date of Birth/Sex: 06/23/1961 (57 y.o. 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 I87.331 Chronic venous hypertension (idiopathic) with ulcer and 03/24/2018 Yes inflammation of right lower extremity E11.622 Type 2 diabetes mellitus with  other skin ulcer 03/24/2018 Yes I89.0 Lymphedema, not elsewhere classified 03/24/2018 Yes Inactive Problems Resolved Problems Electronic Signature(s) Signed: 03/31/2018 5:09:03 PM By: Linton Ham MD Entered By: Linton Ham on 03/31/2018 11:08:25 [name] ([id number]) -------------------------------------------------------------------------------- Progress Note Details Patient Name: [name] Date of Service: 03/31/2018 10:15 AM Medical Record Number: [id number] Patient Account Number: [id number] Date of Birth/Sex: Jan 14, 1961 (57 y.o. M) Treating RN: Cornell Barman Primary Care Provider: [name]  Other Clinician: Referring Provider: [name] Treating Provider/Extender: Ricard Dillon Weeks in Treatment: 1 Subjective History of Present Illness (HPI) 03/24/18; patient is a 57 year old type II diabetic on oral agents. He tells me he has right foot drop secondary to previous back surgery had some years ago. He wears a brace on the right leg and walks with a quad cane. He had the brace adjusted in December/18 and apparently did not fit properly and he developed wounds predominantly on the posterior right calf where the brace rubbed his leg. The patient describes this as acute he noticed it right away and was able to have the brace readjusted. He subsequently psoas had some wounds on the anterior right leg which she says are tape abrasion injuries. His primary doctor is Dr. Rex Kras at [facility]. It looks as though Dr. Rex Kras saw this initially in January. Started him on cephalexin. Patient was seen again on 03/19/17 without much improvement. He has been using Neosporin to the wound areas. He was started on Augmentin on 03/19/18. The patient does not have a wound history. He has a history of a fractured left patella in 2017. Chronic foot drop on the right apparently related to back surgery. Hyperlipidemia and essential hypertension. 03/24/18; the patient still has a  scattering of wounds on the anterior medial extending posteriorly in the right leg. The advertising as this is being secondary to abrasion injury although there is a lot of irritation here and one would have to wonder whether this represents chronic venous inflammation/stasis dermatitis. In any case of that's true it was not well recognized. 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 He is completing Augmentin at this point I don't think he needs additional antibiotics. we applied silver alginate under 4-layer compression which the patient tolerated well Objective Constitutional Patient is hypertensive.. Pulse regular and within target range for patient.Marland Kitchen Respirations regular, non-labored and within target range.. Temperature is normal and within the target range for the patient.Marland Kitchen appears in no distress. Vitals Time Taken: 10:13 AM, Height: 75 in, Weight: 343 lbs, BMI: 42.9, Temperature: 98.1 F, Pulse: 70 bpm, Respiratory Rate: 18 breaths/min, Blood Pressure: 163/70 mmHg. General Notes: pt was crying, his wife passed a month ago. he states that is why his BP is elevated made dr. Dellia Nims aware Eyes Conjunctivae clear. No discharge. Respiratory Respiratory effort is easy and symmetric bilaterally. Rate is normal at rest and on room air.. Cardiovascular Pedal pulses palpable and strong bilaterally.Marland Kitchen edema control is much better in the right leg under compression. Changes of chronic venous insufficiency and secondary lymphedema in the left leg. DONYEL, NESTER K. ([id number]) Lymphatic none palpable in the popliteal area bilaterally. Psychiatric No evidence of depression, anxiety, or agitation. Calm, cooperative, and communicative. Appropriate interactions and affect.. General Notes: wound exam; the patient has wounds on the right anterior leg last week a small cluster these appear better. Ears medially and posteriorly are about the same. I think he has chronic venous  inflammation with erythema and edema but without warmth or palpable tenderness. Not as much weeping edema fluid Integumentary (Hair, Skin) an exacerbation probably has chronic venous inflammation in the area of the right medial lower calf. There is no tenderness here in the warmth I'm doubtful currently of infection. Wound #1 status is Open. Original cause of wound was Trauma. The wound is located on the Right,Posterior Lower Leg. The wound measures 9cm length x 6.5cm width x 0.1cm depth; 45.946cm^2 area and 4.595cm^3 volume. There is Fat Layer (  Subcutaneous Tissue) Exposed exposed. There is no tunneling or undermining noted. There is a large amount of serous drainage noted. The wound margin is flat and intact. There is medium (34-66%) red granulation within the wound bed. There is a medium (34-66%) amount of necrotic tissue within the wound bed including Adherent Slough. The periwound skin appearance exhibited: Excoriation, Hemosiderin Staining. The periwound skin appearance did not exhibit: Callus, Crepitus, Induration, Rash, Scarring, Dry/Scaly, Maceration, Atrophie Blanche, Cyanosis, Ecchymosis, Mottled, Pallor, Rubor, Erythema. Periwound temperature was noted as No Abnormality. Wound #2 status is Open. Original cause of wound was Trauma. The wound is located on the Right,Anterior Lower Leg. The wound measures 1.8cm length x 2cm width x 0.1cm depth; 2.827cm^2 area and 0.283cm^3 volume. There is no tunneling or undermining noted. There is a large amount of serous drainage noted. The wound margin is flat and intact. There is large (67- 100%) red granulation within the wound bed. There is a small (1-33%) amount of necrotic tissue within the wound bed including Adherent Slough. The periwound skin appearance exhibited: Excoriation, Hemosiderin Staining. The periwound skin appearance did not exhibit: Callus, Crepitus, Induration, Rash, Scarring, Dry/Scaly, Maceration, Atrophie Blanche,  Cyanosis, Ecchymosis, Mottled, Pallor, Rubor, Erythema. Periwound temperature was noted as No Abnormality. Assessment Active Problems ICD-10 L97.211 - Non-pressure chronic ulcer of right calf limited to breakdown of skin I87.331 - Chronic venous hypertension (idiopathic) with ulcer and inflammation of right lower extremity E11.622 - Type 2 diabetes mellitus with other skin ulcer I89.0 - Lymphedema, not elsewhere classified Procedures Wound #1 Pre-procedure diagnosis of Wound #1 is a Diabetic Wound/Ulcer of the Lower Extremity located on the Right,Posterior Lower Leg . There was a Four Layer Compression Therapy Procedure with a pre-treatment ABI of 1.1 by Cornell Barman, RN. Post procedure Diagnosis Wound #1: Same as Pre-Procedure ELESTER, APODACA ([id number]) Plan Wound Cleansing: Wound #1 Right,Posterior Lower Leg: Cleanse wound with mild soap and water Wound #2 Right,Anterior Lower Leg: Cleanse wound with mild soap and water Anesthetic (add to Medication List): Wound #1 Right,Posterior Lower Leg: Topical Lidocaine 4% cream applied to wound bed prior to debridement (In Clinic Only). Wound #2 Right,Anterior Lower Leg: Topical Lidocaine 4% cream applied to wound bed prior to debridement (In Clinic Only). Skin Barriers/Peri-Wound Care: Wound #1 Right,Posterior Lower Leg: Triamcinolone Acetonide Ointment (TAC) Wound #2 Right,Anterior Lower Leg: Triamcinolone Acetonide Ointment (TAC) Primary Wound Dressing: Wound #1 Right,Posterior Lower Leg: Silver Alginate Wound #2 Right,Anterior Lower Leg: Silver Alginate Secondary Dressing: Wound #1 Right,Posterior Lower Leg: Other - Absorptive dressing Wound #2 Right,Anterior Lower Leg: Other - Absorptive dressing Dressing Change Frequency: Wound #1 Right,Posterior Lower Leg: Change dressing every week Wound #2 Right,Anterior Lower Leg: Change dressing every week Follow-up Appointments: Wound #1 Right,Posterior Lower Leg: Return Appointment  in 1 week. Nurse Visit as needed Wound #2 Right,Anterior Lower Leg: Return Appointment in 1 week. Nurse Visit as needed Edema Control: Wound #1 Right,Posterior Lower Leg: 4-Layer Compression System - Right Lower Extremity Elevate legs to the level of the heart and pump ankles as often as possible Wound #2 Right,Anterior Lower Leg: 4-Layer Compression System - Right Lower Extremity Elevate legs to the level of the heart and pump ankles as often as possible Additional Orders / Instructions: Wound #1 Right,Posterior Lower Leg: Activity as tolerated Wound #2 Right,Anterior Lower Leg: Activity as tolerated #1 week continued with silver alginate/absorptive dressings over that under four layer compression Humble, Moiz K. ([id number]) #2 TCA to the inflamed skin medially #3 I don't think he needs  any additional antibiotics at this point #4 I'm continuing to consider venous reflux studies. If this area is not any better in terms of erythema next week I'll go ahead and order these. He gives the history that this was all a new brace-type injury I find myself doubting that. Electronic Signature(s) Signed: 03/31/2018 5:09:03 PM By: Linton Ham MD Entered By: Linton Ham on 03/31/2018 11:15:47 [name] ([id number]) -------------------------------------------------------------------------------- SuperBill Details Patient Name: [name] Date of Service: 03/31/2018 Medical Record Number: [id number] Patient Account Number: [id number] Date of Birth/Sex: 04/11/1961 (57 y.o. M) Treating RN: Cornell Barman Primary Care Provider: [name] Other Clinician: Referring Provider: [name] Treating Provider/Extender: Tito Dine in Treatment: 1 Diagnosis Coding ICD-10 Codes Code Description [phone] Non-pressure chronic ulcer of right calf limited to breakdown of skin I87.331 Chronic venous hypertension (idiopathic) with ulcer and inflammation of right lower  extremity E11.622 Type 2 diabetes mellitus with other skin ulcer I89.0 Lymphedema, not elsewhere classified Facility Procedures CPT4 Code Description: [id number] (Facility Use Only) [phone] - [place] [facility] LEG Modifier: Quantity: 1 Physician Procedures CPT4: Description Modifier Quantity Code [id number] - WC PHYS LEVEL 3 - EST PT 1 ICD-10 Diagnosis Description L97.211 Non-pressure chronic ulcer of right calf limited to breakdown of skin I87.331 Chronic venous hypertension (idiopathic) with ulcer and  inflammation of right lower extremity Electronic Signature(s) Signed: 03/31/2018 5:09:03 PM By: Linton Ham MD Entered By: Linton Ham on 03/31/2018 11:16:13

## 2018-04-06 NOTE — Progress Notes (Signed)
JAYANT, KRIZ ([id number]) Visit Report for 03/31/2018 Arrival Information Details Patient Name: LADON, VANDENBERGHE Date of Service: 03/31/2018 10:15 AM Medical Record Number: [id number] Patient Account Number: [id number] Date of Birth/Sex: 1961/08/04 (57 y.o. M) Treating RN: Ahmed Prima Primary Care Verlena Marlette: [name] Other Clinician: Referring Jeromey Kruer: [name] Treating Sabriah Hobbins/Extender: Tito Dine in Treatment: 1 Visit Information History Since Last Visit All ordered tests and consults were completed: No Patient Arrived: Cane Added or deleted any medications: No Arrival Time: 10:13 Any new allergies or adverse reactions: No Accompanied By: self Had a fall or experienced change in No Transfer Assistance: EasyPivot Patient activities of daily living that may affect Lift risk of falls: Patient Identification Verified: Yes Signs or symptoms of abuse/neglect since last visito No Secondary Verification Process Yes Hospitalized since last visit: No Completed: Implantable device outside of the clinic excluding No Patient Requires Transmission-Based No cellular tissue based products placed in the center Precautions: since last visit: Patient Has Alerts: Yes Has Dressing in Place as Prescribed: Yes Patient Alerts: DMII Has Compression in Place as Prescribed: Yes Pain Present Now: No Electronic Signature(s) Signed: 04/01/2018 5:13:37 PM By: Alric Quan Entered By: Alric Quan on 03/31/2018 10:13:23 [name] ([id number]) -------------------------------------------------------------------------------- Compression Therapy Details Patient Name: [name] Date of Service: 03/31/2018 10:15 AM Medical Record Number: [id number] Patient Account Number: [id number] Date of Birth/Sex: Nov 03, 1961 (57 y.o. M) Treating RN: Cornell Barman Primary Care Gavynn Duvall: [name] Other Clinician: Referring Alan Riles: [name] Treating Laylynn Campanella/Extender:  Tito Dine in Treatment: 1 Compression Therapy Performed for Wound Assessment: Wound #1 Right,Posterior Lower Leg Performed By: Clinician Cornell Barman, RN Compression Type: Four Layer Pre Treatment ABI: 1.1 Post Procedure Diagnosis Same as Pre-procedure Electronic Signature(s) Signed: 03/31/2018 5:11:52 PM By: Gretta Cool, BSN, RN, CWS, Kim RN, BSN Entered By: Gretta Cool, BSN, RN, CWS, Kim on 03/31/2018 10:40:54 [name] ([id number]) -------------------------------------------------------------------------------- Encounter Discharge Information Details Patient Name: [name] Date of Service: 03/31/2018 10:15 AM Medical Record Number: [id number] Patient Account Number: [id number] Date of Birth/Sex: [birth year] (57 y.o. M) Treating RN: Cornell Barman Primary Care Marena Witts: [name] Other Clinician: Referring Lalisa Kiehn: [name] Treating Nikolas Casher/Extender: Tito Dine in Treatment: 1 Encounter Discharge Information Items Discharge Pain Level: 0 Discharge Condition: Stable Ambulatory Status: Cane Discharge Destination: Home Transportation: Private Auto Accompanied By: son Schedule Follow-up Appointment: Yes Medication Reconciliation completed and No provided to Patient/Care Umair Rosiles: Provided on Clinical Summary of Care: 03/31/2018 Form Type Recipient Paper Patient BS Electronic Signature(s) Signed: 03/31/2018 11:06:54 AM By: Montey Hora Entered By: Montey Hora on 03/31/2018 11:06:54 [name] ([id number]) -------------------------------------------------------------------------------- Lower Extremity Assessment Details Patient Name: [name] Date of Service: 03/31/2018 10:15 AM Medical Record Number: [id number] Patient Account Number: [id number] Date of Birth/Sex: 28-Feb-1961 (57 y.o. M) Treating RN: Ahmed Prima Primary Care Americo Vallery: [name] Other Clinician: Referring Nyeisha Goodall: [name] Treating Marybel Alcott/Extender:  Tito Dine in Treatment: 1 Edema Assessment Assessed: [Left: No] [Right: No] [Left: Edema] [Right: :] Calf Left: Right: Point of Measurement: 36 cm From Medial Instep cm 49.6 cm Ankle Left: Right: Point of Measurement: 14 cm From Medial Instep cm 28.9 cm Vascular Assessment Pulses: Dorsalis Pedis Palpable: [Right:Yes] Posterior Tibial Extremity colors, hair growth, and conditions: Extremity Color: [Right:Red] Temperature of Extremity: [Right:Warm] Capillary Refill: [Right:< 3 seconds] Toe Nail Assessment Left: Right: Thick: No Discolored: Yes Deformed: No Improper Length and Hygiene: No Electronic Signature(s) Signed: 04/01/2018 5:13:37 PM By: Alric Quan Entered By: Carolyne Fiscal,  Debra on 03/31/2018 10:24:49 AKASHDEEP, CHUBA ([id number]) -------------------------------------------------------------------------------- Multi Wound Chart Details Patient Name: TREMANE, SPURGEON Date of Service: 03/31/2018 10:15 AM Medical Record Number: [id number] Patient Account Number: [id number] Date of Birth/Sex: 1961/10/28 (57 y.o. M) Treating RN: Cornell Barman Primary Care Cesilia Shinn: [name] Other Clinician: Referring Mitch Arquette: [name] Treating Kamarah Bilotta/Extender: Tito Dine in Treatment: 1 Vital Signs Height(in): 75 Pulse(bpm): 70 Weight(lbs): 343 Blood Pressure(mmHg): 163/70 Body Mass Index(BMI): 43 Temperature(F): 98.1 Respiratory Rate 18 (breaths/min): Photos: [1:No Photos] [2:No Photos] [N/A:N/A] Wound Location: [1:Right Lower Leg - Posterior] [2:Right Lower Leg - Anterior] [N/A:N/A] Wounding Event: [1:Trauma] [2:Trauma] [N/A:N/A] Primary Etiology: [1:Diabetic Wound/Ulcer of the Lower Extremity] [2:Diabetic Wound/Ulcer of the Lower Extremity] [N/A:N/A] Secondary Etiology: [1:Venous Leg Ulcer] [2:Venous Leg Ulcer] [N/A:N/A] Comorbid History: [1:Hypertension, Type II Diabetes, Neuropathy] [2:Hypertension, Type II Diabetes, Neuropathy]  [N/A:N/A] Date Acquired: [1:11/23/2017] [2:11/23/2017] [N/A:N/A] Weeks of Treatment: [1:1] [2:1] [N/A:N/A] Wound Status: [1:Open] [2:Open] [N/A:N/A] Measurements L x W x D [1:9x6.5x0.1] [2:1.8x2x0.1] [N/A:N/A] (cm) Area (cm) : [1:45.946] [2:2.827] [N/A:N/A] Volume (cm) : [1:4.595] [2:0.283] [N/A:N/A] % Reduction in Area: [1:-90.40%] [2:31.80%] [N/A:N/A] % Reduction in Volume: [1:-90.40%] [2:31.80%] [N/A:N/A] Classification: [1:Grade 1] [2:Grade 1] [N/A:N/A] Exudate Amount: [1:Large] [2:Large] [N/A:N/A] Exudate Type: [1:Serous] [2:Serous] [N/A:N/A] Exudate Color: [1:amber] [2:amber] [N/A:N/A] Wound Margin: [1:Flat and Intact] [2:Flat and Intact] [N/A:N/A] Granulation Amount: [1:Medium (34-66%)] [2:Large (67-100%)] [N/A:N/A] Granulation Quality: [1:Red] [2:Red] [N/A:N/A] Necrotic Amount: [1:Medium (34-66%)] [2:Small (1-33%)] [N/A:N/A] Exposed Structures: [1:Fat Layer (Subcutaneous Tissue) Exposed: Yes Fascia: No Tendon: No Muscle: No Joint: No Bone: No] [2:Fascia: No Fat Layer (Subcutaneous Tissue) Exposed: No Tendon: No Muscle: No Joint: No Bone: No] [N/A:N/A] Epithelialization: [1:Small (1-33%)] [2:Small (1-33%)] [N/A:N/A] Periwound Skin Texture: [1:Excoriation: Yes Induration: No Callus: No Crepitus: No] [2:Excoriation: Yes Induration: No Callus: No Crepitus: No] [N/A:N/A] Rash: No Rash: No Scarring: No Scarring: No Periwound Skin Moisture: Maceration: No Maceration: No N/A Dry/Scaly: No Dry/Scaly: No Periwound Skin Color: Hemosiderin Staining: Yes Hemosiderin Staining: Yes N/A Atrophie Blanche: No Atrophie Blanche: No Cyanosis: No Cyanosis: No Ecchymosis: No Ecchymosis: No Erythema: No Erythema: No Mottled: No Mottled: No Pallor: No Pallor: No Rubor: No Rubor: No Temperature: No Abnormality No Abnormality N/A Tenderness on Palpation: No No N/A Wound Preparation: Ulcer Cleansing: Ulcer Cleansing: N/A Rinsed/Irrigated with Saline, Rinsed/Irrigated with  Saline, Other: soap and water Other: soap and water Topical Anesthetic Applied: Topical Anesthetic Applied: Other: lidocaine 4% Other: lidocaine 4% Procedures Performed: Compression Therapy N/A N/A Treatment Notes Wound #1 (Right, Posterior Lower Leg) 1. Cleansed with: Clean wound with Normal Saline Cleanse wound with antibacterial soap and water 2. Anesthetic Topical Lidocaine 4% cream to wound bed prior to debridement 3. Peri-wound Care: Other peri-wound care (specify in notes) 4. Dressing Applied: Other dressing (specify in notes) 5. Secondary Dressing Applied ABD Pad 7. Secured with 4-Layer Compression System - Right Lower Extremity Notes tca cream, silvercell, xtrasorb, Wound #2 (Right, Anterior Lower Leg) 1. Cleansed with: Clean wound with Normal Saline Cleanse wound with antibacterial soap and water 2. Anesthetic Topical Lidocaine 4% cream to wound bed prior to debridement 3. Peri-wound Care: Other peri-wound care (specify in notes) 4. Dressing Applied: Other dressing (specify in notes) 5. Secondary Dressing Applied ABD Pad 7. Secured with 4-Layer Compression System - Right Lower Extremity BASILIO, MEADOW ([id number]) Notes tca cream, silvercell, xtrasorb, Electronic Signature(s) Signed: 03/31/2018 5:09:03 PM By: Linton Ham MD Entered By: Linton Ham on 03/31/2018 11:08:50 [name] ([id number]) -------------------------------------------------------------------------------- Multi-Disciplinary Care Plan Details Patient Name: [name] Date  of Service: 03/31/2018 10:15 AM Medical Record Number: [id number] Patient Account Number: [id number] Date of Birth/Sex: 1961-08-02 (57 y.o. M) Treating RN: Cornell Barman Primary Care Jakerra Floyd: [name] Other Clinician: Referring Nickey Kloepfer: [name] Treating Strider Vallance/Extender: Tito Dine in Treatment: 1 Active Inactive ` Orientation to the Wound Care Program Nursing  Diagnoses: Knowledge deficit related to the wound healing center program Goals: Patient/caregiver will verbalize understanding of the [facility] Date Initiated: 03/24/2018 Target Resolution Date: 04/23/2018 Goal Status: Active Interventions: Provide education on orientation to the wound center Notes: ` Soft Tissue Infection Nursing Diagnoses: Potential for infection: soft tissue Goals: Patient will remain free of wound infection Date Initiated: 03/24/2018 Target Resolution Date: 04/23/2018 Goal Status: Active Interventions: Assess signs and symptoms of infection every visit Treatment Activities: Systemic antibiotics : 03/24/2018 Notes: ` Wound/Skin Impairment Nursing Diagnoses: Impaired tissue integrity Goals: Ulcer/skin breakdown will have a volume reduction of 80% by week 12 Date Initiated: 03/24/2018 Target Resolution Date: 07/24/2018 KAESEN, RODRIGUEZ ([id number]) Goal Status: Active Ulcer/skin breakdown will heal within 14 weeks Date Initiated: 03/24/2018 Target Resolution Date: 07/24/2018 Goal Status: Active Interventions: Assess ulceration(s) every visit Treatment Activities: Referred to DME [facility] for dressing supplies : 03/24/2018 Topical wound management initiated : 03/24/2018 Notes: Electronic Signature(s) Signed: 03/31/2018 5:11:52 PM By: Gretta Cool, BSN, RN, CWS, Kim RN, BSN Entered By: Gretta Cool, BSN, RN, CWS, Kim on 03/31/2018 10:40:15 [name] ([id number]) -------------------------------------------------------------------------------- Pain Assessment Details Patient Name: [name] Date of Service: 03/31/2018 10:15 AM Medical Record Number: [id number] Patient Account Number: [id number] Date of Birth/Sex: [birth year] (57 y.o. M) Treating RN: Ahmed Prima Primary Care Refoel Palladino: [name] Other Clinician: Referring Carnell Casamento: [name] Treating Reneisha Stilley/Extender: Tito Dine in Treatment: 1 Active Problems Location of  Pain Severity and Description of Pain Patient Has Paino No Site Locations Pain Management and Medication Current Pain Management: Electronic Signature(s) Signed: 04/01/2018 5:13:37 PM By: Alric Quan Entered By: Alric Quan on 03/31/2018 10:13:29 [name] ([id number]) -------------------------------------------------------------------------------- Patient/Caregiver Education Details Patient Name: [name] Date of Service: 03/31/2018 10:15 AM Medical Record Number: [id number] Patient Account Number: [id number] Date of Birth/Gender: 06-28-1961 (57 y.o. M) Treating RN: Montey Hora Primary Care Physician: [name] Other Clinician: Referring Physician: [name] Treating Physician/Extender: Tito Dine in Treatment: 1 Education Assessment Education Provided To: Patient Education Topics Provided Venous: Handouts: Other: need for ongoing compression to both legs Methods: Explain/Verbal Responses: State content correctly Electronic Signature(s) Signed: 03/31/2018 4:39:21 PM By: Montey Hora Entered By: Montey Hora on 03/31/2018 11:07:22 [name] ([id number]) -------------------------------------------------------------------------------- Wound Assessment Details Patient Name: [name] Date of Service: 03/31/2018 10:15 AM Medical Record Number: [id number] Patient Account Number: [id number] Date of Birth/Sex: 1961/01/03 (57 y.o. M) Treating RN: Ahmed Prima Primary Care Kendria Halberg: [name] Other Clinician: Referring Claudina Oliphant: [name] Treating Srihari Shellhammer/Extender: Tito Dine in Treatment: 1 Wound Status Wound Number: 1 Primary Etiology: Diabetic Wound/Ulcer of the Lower Extremity Wound Location: Right Lower Leg - Posterior Secondary Venous Leg Ulcer Wounding Event: Trauma Etiology: Date Acquired: 11/23/2017 Wound Status: Open Weeks Of Treatment: 1 Comorbid History: Hypertension, Type II  Diabetes, Clustered Wound: No Neuropathy Photos Photo Uploaded By: Montey Hora on 03/31/2018 14:55:46 Wound Measurements Length: (cm) 9 Width: (cm) 6.5 Depth: (cm) 0.1 Area: (cm) 45.946 Volume: (cm) 4.595 % Reduction in Area: -90.4% % Reduction in Volume: -90.4% Epithelialization: Small (1-33%) Tunneling: No Undermining: No Wound Description Classification: Grade 1 Wound Margin: Flat and Intact Exudate Amount: Large  Exudate Type: Serous Exudate Color: amber Foul Odor After Cleansing: No Slough/Fibrino Yes Wound Bed Granulation Amount: Medium (34-66%) Exposed Structure Granulation Quality: Red Fascia Exposed: No Necrotic Amount: Medium (34-66%) Fat Layer (Subcutaneous Tissue) Exposed: Yes Necrotic Quality: Adherent Slough Tendon Exposed: No Muscle Exposed: No Joint Exposed: No Bone Exposed: No Periwound Skin Texture Zia, Zailen K. ([id number]) Texture Color No Abnormalities Noted: No No Abnormalities Noted: No Callus: No Atrophie Blanche: No Crepitus: No Cyanosis: No Excoriation: Yes Ecchymosis: No Induration: No Erythema: No Rash: No Hemosiderin Staining: Yes Scarring: No Mottled: No Pallor: No Moisture Rubor: No No Abnormalities Noted: No Dry / Scaly: No Temperature / Pain Maceration: No Temperature: No Abnormality Wound Preparation Ulcer Cleansing: Rinsed/Irrigated with Saline, Other: soap and water, Topical Anesthetic Applied: Other: lidocaine 4%, Treatment Notes Wound #1 (Right, Posterior Lower Leg) 1. Cleansed with: Clean wound with Normal Saline Cleanse wound with antibacterial soap and water 2. Anesthetic Topical Lidocaine 4% cream to wound bed prior to debridement 3. Peri-wound Care: Other peri-wound care (specify in notes) 4. Dressing Applied: Other dressing (specify in notes) 5. Secondary Dressing Applied ABD Pad 7. Secured with 4-Layer Compression System - Right Lower Extremity Notes tca cream, silvercell,  xtrasorb, Electronic Signature(s) Signed: 04/01/2018 5:13:37 PM By: Alric Quan Entered By: Alric Quan on 03/31/2018 10:31:19 [name] ([id number]) -------------------------------------------------------------------------------- Wound Assessment Details Patient Name: [name] Date of Service: 03/31/2018 10:15 AM Medical Record Number: [id number] Patient Account Number: [id number] Date of Birth/Sex: 09-Dec-1961 (57 y.o. M) Treating RN: Ahmed Prima Primary Care Perley Arthurs: [name] Other Clinician: Referring Brinklee Cisse: [name] Treating Shaune Westfall/Extender: Tito Dine in Treatment: 1 Wound Status Wound Number: 2 Primary Etiology: Diabetic Wound/Ulcer of the Lower Extremity Wound Location: Right Lower Leg - Anterior Secondary Venous Leg Ulcer Wounding Event: Trauma Etiology: Date Acquired: 11/23/2017 Wound Status: Open Weeks Of Treatment: 1 Comorbid History: Hypertension, Type II Diabetes, Clustered Wound: No Neuropathy Photos Photo Uploaded By: Montey Hora on 03/31/2018 14:56:02 Wound Measurements Length: (cm) 1.8 Width: (cm) 2 Depth: (cm) 0.1 Area: (cm) 2.827 Volume: (cm) 0.283 % Reduction in Area: 31.8% % Reduction in Volume: 31.8% Epithelialization: Small (1-33%) Tunneling: No Undermining: No Wound Description Classification: Grade 1 Wound Margin: Flat and Intact Exudate Amount: Large Exudate Type: Serous Exudate Color: amber Foul Odor After Cleansing: No Slough/Fibrino Yes Wound Bed Granulation Amount: Large (67-100%) Exposed Structure Granulation Quality: Red Fascia Exposed: No Necrotic Amount: Small (1-33%) Fat Layer (Subcutaneous Tissue) Exposed: No Necrotic Quality: Adherent Slough Tendon Exposed: No Muscle Exposed: No Joint Exposed: No Bone Exposed: No Periwound Skin Texture Coutts, Sayge K. ([id number]) Texture Color No Abnormalities Noted: No No Abnormalities Noted: No Callus: No Atrophie  Blanche: No Crepitus: No Cyanosis: No Excoriation: Yes Ecchymosis: No Induration: No Erythema: No Rash: No Hemosiderin Staining: Yes Scarring: No Mottled: No Pallor: No Moisture Rubor: No No Abnormalities Noted: No Dry / Scaly: No Temperature / Pain Maceration: No Temperature: No Abnormality Wound Preparation Ulcer Cleansing: Rinsed/Irrigated with Saline, Other: soap and water, Topical Anesthetic Applied: Other: lidocaine 4%, Treatment Notes Wound #2 (Right, Anterior Lower Leg) 1. Cleansed with: Clean wound with Normal Saline Cleanse wound with antibacterial soap and water 2. Anesthetic Topical Lidocaine 4% cream to wound bed prior to debridement 3. Peri-wound Care: Other peri-wound care (specify in notes) 4. Dressing Applied: Other dressing (specify in notes) 5. Secondary Dressing Applied ABD Pad 7. Secured with 4-Layer Compression System - Right Lower Extremity Notes tca cream, silvercell, xtrasorb, Electronic Signature(s) Signed: 04/01/2018 5:13:37 PM  By: Alric Quan Entered By: Alric Quan on 03/31/2018 10:29:43 [name] ([id number]) -------------------------------------------------------------------------------- Vitals Details Patient Name: [name] Date of Service: 03/31/2018 10:15 AM Medical Record Number: [id number] Patient Account Number: [id number] Date of Birth/Sex: [birth year] (57 y.o. M) Treating RN: Ahmed Prima Primary Care Jacklyn Branan: [name] Other Clinician: Referring Lajuane Leatham: [name] Treating Raahim Shartzer/Extender: Tito Dine in Treatment: 1 Vital Signs Time Taken: 10:13 Temperature (F): 98.1 Height (in): 75 Pulse (bpm): 70 Weight (lbs): 343 Respiratory Rate (breaths/min): 18 Body Mass Index (BMI): 42.9 Blood Pressure (mmHg): 163/70 Reference Range: 80 - 120 mg / dl Notes pt was crying, his wife passed a month ago. he states that is why his BP is elevated made dr. Dellia Nims aware Electronic  Signature(s) Signed: 04/01/2018 5:13:37 PM By: Alric Quan Entered By: Alric Quan on 03/31/2018 10:26:18

## 2018-04-07 ENCOUNTER — Encounter: Payer: Medicare HMO | Admitting: Internal Medicine

## 2018-04-07 DIAGNOSIS — Z6841 Body Mass Index (BMI) 40.0 and over, adult: Secondary | ICD-10-CM | POA: Diagnosis not present

## 2018-04-07 DIAGNOSIS — L97212 Non-pressure chronic ulcer of right calf with fat layer exposed: Secondary | ICD-10-CM | POA: Diagnosis not present

## 2018-04-07 DIAGNOSIS — I890 Lymphedema, not elsewhere classified: Secondary | ICD-10-CM | POA: Diagnosis not present

## 2018-04-07 DIAGNOSIS — E1140 Type 2 diabetes mellitus with diabetic neuropathy, unspecified: Secondary | ICD-10-CM | POA: Diagnosis not present

## 2018-04-07 DIAGNOSIS — S81801A Unspecified open wound, right lower leg, initial encounter: Secondary | ICD-10-CM | POA: Diagnosis not present

## 2018-04-07 DIAGNOSIS — I87331 Chronic venous hypertension (idiopathic) with ulcer and inflammation of right lower extremity: Secondary | ICD-10-CM | POA: Diagnosis not present

## 2018-04-07 DIAGNOSIS — L97211 Non-pressure chronic ulcer of right calf limited to breakdown of skin: Secondary | ICD-10-CM | POA: Diagnosis not present

## 2018-04-07 DIAGNOSIS — I10 Essential (primary) hypertension: Secondary | ICD-10-CM | POA: Diagnosis not present

## 2018-04-07 DIAGNOSIS — L309 Dermatitis, unspecified: Secondary | ICD-10-CM | POA: Diagnosis not present

## 2018-04-07 DIAGNOSIS — M21371 Foot drop, right foot: Secondary | ICD-10-CM | POA: Diagnosis not present

## 2018-04-07 DIAGNOSIS — I872 Venous insufficiency (chronic) (peripheral): Secondary | ICD-10-CM | POA: Diagnosis not present

## 2018-04-07 DIAGNOSIS — E11622 Type 2 diabetes mellitus with other skin ulcer: Secondary | ICD-10-CM | POA: Diagnosis not present

## 2018-04-10 ENCOUNTER — Other Ambulatory Visit (INDEPENDENT_AMBULATORY_CARE_PROVIDER_SITE_OTHER): Payer: Self-pay | Admitting: Orthopaedic Surgery

## 2018-04-11 NOTE — Progress Notes (Signed)
TERRIN, MEDDAUGH ([id number]) Visit Report for 04/07/2018 HPI Details Patient Name: [name], [name] Date of Service: 04/07/2018 11:15 AM Medical Record Number: [id number] Patient Account Number: [id number] Date of Birth/Sex: Dec 27, 1960 (57 y.o. M) Treating RN: Cornell Barman Primary Care Provider: [name] Other Clinician: Referring Provider: [name] Treating Provider/Extender: Tito Dine in Treatment: 2 History of Present Illness HPI Description: 03/24/18; patient is a 57 year old type II diabetic on oral agents. He tells me he has right foot drop secondary to previous back surgery had some years ago. He wears a brace on the right leg and walks with a quad cane. He had the brace adjusted in December/18 and apparently did not fit properly and he developed wounds predominantly on the posterior right calf where the brace rubbed his leg. The patient describes this as acute he noticed it right away and was able to have the brace readjusted. He subsequently psoas had some wounds on the anterior right leg which she says are tape abrasion injuries. His primary doctor is Dr. Rex Kras at [facility]. It looks as though Dr. Rex Kras saw this initially in January. Started him on cephalexin. Patient was seen again on 03/19/17 without much improvement. He has been using Neosporin to the wound areas. He was started on Augmentin on 03/19/18. The patient does not have a wound history. He has a history of a fractured left patella in 2017. Chronic foot drop on the right apparently related to back surgery. Hyperlipidemia and essential hypertension. 03/24/18; the patient still has a scattering of wounds on the anterior medial extending posteriorly in the right leg. The advertising as this is being secondary to abrasion injury although there is a lot of irritation here and one would have to wonder whether this represents chronic venous inflammation/stasis dermatitis. In any case of that's true it was  not well recognized. 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 He is completing Augmentin at this point I don't think he needs additional antibiotics. we applied silver alginate under 4-layer compression which the patient tolerated well 04/07/18; the patient's major wound areas on the medial right leg. This is in the setting of chronic venous insufficiency/stasis dermatitis and lymphedema. We applied silver alginate under for liver compression. He's been scratching at this through the stop under the stockings. He has superficial wounds just below the anterior stocking line. He also fell and the [employer] parking lot superficial "road burn" on the left knee and left elbow. We did not the define these areas Electronic Signature(s) Signed: 04/07/2018 5:40:08 PM By: Linton Ham MD Entered By: Linton Ham on 04/07/2018 13:04:57 [name] ([id number]) -------------------------------------------------------------------------------- Physical Exam Details Patient Name: [name] Date of Service: 04/07/2018 11:15 AM Medical Record Number: [id number] Patient Account Number: [id number] Date of Birth/Sex: 1961/03/21 (57 y.o. M) Treating RN: Cornell Barman Primary Care Provider: [name] Other Clinician: Referring Provider: [name] Treating Provider/Extender: Tito Dine in Treatment: 2 Constitutional Patient is hypertensive.. Pulse regular and within target range for patient.Marland Kitchen Respirations regular, non-labored and within target range.. Temperature is normal and within the target range for the patient.Marland Kitchen appears in no distress. Eyes Conjunctivae clear. No discharge. Respiratory Respiratory effort is easy and symmetric bilaterally. Rate is normal at rest and on room air.. Cardiovascular pedal pulses are palpable on the right. Edema present in both extremities.however this is much better controlled on the right. Integumentary (Hair, Skin) road burn/grass  on the right knee and right elbow. I think this can  be managed with topical antibiotics. Psychiatric No evidence of depression, anxiety, or agitation. Calm, cooperative, and communicative. Appropriate interactions and affect.. Notes would exam; the patient has wounds on the right anterior leg these appear a lot better. The cluster medially surrounded by venous inflammation also looks better packed. There is much less weeping edema fluid. Electronic Signature(s) Signed: 04/07/2018 5:40:08 PM By: Linton Ham MD Entered By: Linton Ham on 04/07/2018 13:06:27 [name] ([id number]) -------------------------------------------------------------------------------- Physician Orders Details Patient Name: [name] Date of Service: 04/07/2018 11:15 AM Medical Record Number: [id number] Patient Account Number: [id number] Date of Birth/Sex: 1961-08-11 (57 y.o. M) Treating RN: Cornell Barman Primary Care Provider: [name] Other Clinician: Referring Provider: [name] Treating Provider/Extender: Tito Dine in Treatment: 2 Verbal / Phone Orders: No Diagnosis Coding Wound Cleansing Wound #1 Right,Posterior Lower Leg o Cleanse wound with mild soap and water Wound #2 Right,Anterior Lower Leg o Cleanse wound with mild soap and water Anesthetic (add to Medication List) Wound #1 Right,Posterior Lower Leg o Topical Lidocaine 4% cream applied to wound bed prior to debridement (In Clinic Only). Wound #2 Right,Anterior Lower Leg o Topical Lidocaine 4% cream applied to wound bed prior to debridement (In Clinic Only). Skin Barriers/Peri-Wound Care Wound #1 Right,Posterior Lower Leg o Triamcinolone Acetonide Ointment (TAC) Wound #2 Right,Anterior Lower Leg o Triamcinolone Acetonide Ointment (TAC) Primary Wound Dressing Wound #1 Right,Posterior Lower Leg o Silver Alginate Wound #2 Right,Anterior Lower Leg o Silver Alginate Secondary Dressing Wound #1  Right,Posterior Lower Leg o Other - Absorptive dressing Wound #2 Right,Anterior Lower Leg o Other - Absorptive dressing Dressing Change Frequency Wound #1 Right,Posterior Lower Leg o Change dressing every week Wound #2 Right,Anterior Lower Leg o Change dressing every week Follow-up Appointments TIAN, MCMURTREY ([id number]) Wound #1 Right,Posterior Lower Leg o Return Appointment in 1 week. o Nurse Visit as needed Wound #2 Right,Anterior Lower Leg o Return Appointment in 1 week. o Nurse Visit as needed Edema Control Wound #1 Right,Posterior Lower Leg o 4-Layer Compression System - Right Lower Extremity o Elevate legs to the level of the heart and pump ankles as often as possible Wound #2 Right,Anterior Lower Leg o 4-Layer Compression System - Right Lower Extremity o Elevate legs to the level of the heart and pump ankles as often as possible Additional Orders / Instructions Wound #1 Right,Posterior Lower Leg o Activity as tolerated Wound #2 Right,Anterior Lower Leg o Activity as tolerated Services and Therapies o Arterial Studies- Bilateral Notes Triple antibiotic cream and cover dressing on knee and elbow. Electronic Signature(s) Signed: 04/07/2018 12:27:24 PM By: Gretta Cool, BSN, RN, CWS, Kim RN, BSN Signed: 04/07/2018 5:40:08 PM By: Linton Ham MD Entered By: Gretta Cool, BSN, RN, CWS, Kim on 04/07/2018 12:00:56 GLENNIE, RODDA ([id number]) -------------------------------------------------------------------------------- Problem List Details Patient Name: [name] Date of Service: 04/07/2018 11:15 AM Medical Record Number: [id number] Patient Account Number: [id number] Date of Birth/Sex: [birth year] (57 y.o. 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 I87.331 Chronic venous hypertension (idiopathic) with ulcer and 03/24/2018 Yes inflammation of right lower extremity E11.622 Type 2 diabetes mellitus with other skin ulcer 03/24/2018 Yes I89.0 Lymphedema, not elsewhere classified 03/24/2018 Yes Inactive Problems Resolved Problems Electronic Signature(s) Signed: 04/07/2018 5:40:08 PM By: Linton Ham  MD Entered By: Linton Ham on 04/07/2018 13:00:36 [name] ([id number]) -------------------------------------------------------------------------------- Progress Note Details Patient Name: [name] Date of Service: 04/07/2018 11:15 AM Medical Record Number: [id number] Patient Account Number: [id number] Date of Birth/Sex: 1961-02-03 (57 y.o. M) Treating RN: Cornell Barman Primary Care Provider: [name] Other Clinician: Referring Provider: [name] Treating Provider/Extender: Tito Dine in Treatment: 2 Subjective History of Present Illness (HPI) 03/24/18; patient is a 57 year old type II diabetic on oral agents. He tells me he has right foot drop secondary to previous back surgery had some years ago. He wears a brace on the right leg and walks with a quad cane. He had the brace adjusted in December/18 and apparently did not fit properly and he developed wounds predominantly on the posterior right calf where the brace rubbed his leg. The patient describes this as acute he noticed it right away and was able to have the brace readjusted. He subsequently psoas had some wounds on the anterior right leg which she says are tape abrasion injuries. His primary doctor is Dr. Rex Kras at [facility]. It looks as though Dr. Rex Kras saw this initially in January. Started him on cephalexin. Patient was seen again on 03/19/17 without much improvement. He has been using Neosporin to the  wound areas. He was started on Augmentin on 03/19/18. The patient does not have a wound history. He has a history of a fractured left patella in 2017. Chronic foot drop on the right apparently related to back surgery. Hyperlipidemia and essential hypertension. 03/24/18; the patient still has a scattering of wounds on the anterior medial extending posteriorly in the right leg. The advertising as this is being secondary to abrasion injury although there is a lot of irritation here and one would have to wonder whether this represents chronic venous inflammation/stasis dermatitis. In any case of that's true it was not well recognized. 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 He is completing Augmentin at this point I don't think he needs additional antibiotics. we applied silver alginate under 4-layer compression which the patient tolerated well 04/07/18; the patient's major wound areas on the medial right leg. This is in the setting of chronic venous insufficiency/stasis dermatitis and lymphedema. We applied silver alginate under for liver compression. He's been scratching at this through the stop under the stockings. He has superficial wounds just below the anterior stocking line. He also fell and the [employer] parking lot superficial "road burn" on the left knee and left elbow. We did not the define these areas Objective Constitutional Patient is hypertensive.. Pulse regular and within target range for patient.Marland Kitchen Respirations regular, non-labored and within target range.. Temperature is normal and within the target range for the patient.Marland Kitchen appears in no distress. Vitals Time Taken: 11:38 AM, Height: 75 in, Weight: 343 lbs, BMI: 42.9, Temperature: 98.0 F, Pulse: 93 bpm, Respiratory Rate: 18 breaths/min, Blood Pressure: 169/76 mmHg. Eyes Conjunctivae clear. No discharge. Respiratory Respiratory effort is easy and symmetric bilaterally. Rate is normal at rest and on room  air.. Cardiovascular Teaney, Sandor K. ([id number]) pedal pulses are palpable on the right. Edema present in both extremities.however this is much better controlled on the right. Psychiatric No evidence of depression, anxiety, or agitation. Calm, cooperative, and communicative. Appropriate interactions and affect.. General Notes: would exam; the patient has wounds on the right anterior leg these appear a lot better. The cluster medially surrounded by venous inflammation also looks better packed. There is much less weeping edema fluid. Integumentary (  Hair, Skin) road burn/grass on the right knee and right elbow. I think this can be managed with topical antibiotics. Wound #1 status is Open. Original cause of wound was Trauma. The wound is located on the Right,Posterior Lower Leg. The wound measures 6.5cm length x 6.9cm width x 0.1cm depth; 35.225cm^2 area and 3.523cm^3 volume. There is Fat Layer (Subcutaneous Tissue) Exposed exposed. There is no tunneling or undermining noted. There is a large amount of serous drainage noted. The wound margin is flat and intact. There is medium (34-66%) red granulation within the wound bed. There is a medium (34-66%) amount of necrotic tissue within the wound bed including Adherent Slough. The periwound skin appearance exhibited: Excoriation, Hemosiderin Staining. The periwound skin appearance did not exhibit: Callus, Crepitus, Induration, Rash, Scarring, Dry/Scaly, Maceration, Atrophie Blanche, Cyanosis, Ecchymosis, Mottled, Pallor, Rubor, Erythema. Periwound temperature was noted as No Abnormality. Wound #2 status is Open. Original cause of wound was Trauma. The wound is located on the Right,Anterior Lower Leg. The wound measures 0.3cm length x 1.5cm width x 0.1cm depth; 0.353cm^2 area and 0.035cm^3 volume. There is no tunneling or undermining noted. There is a large amount of serous drainage noted. The wound margin is flat and intact. There is large (67-100%) red  granulation within the wound bed. There is a small (1-33%) amount of necrotic tissue within the wound bed including Adherent Slough. The periwound skin appearance exhibited: Excoriation, Hemosiderin Staining. The periwound skin appearance did not exhibit: Callus, Crepitus, Induration, Rash, Scarring, Dry/Scaly, Maceration, Atrophie Blanche, Cyanosis, Ecchymosis, Mottled, Pallor, Rubor, Erythema. Periwound temperature was noted as No Abnormality. Assessment Active Problems ICD-10 L97.211 - Non-pressure chronic ulcer of right calf limited to breakdown of skin I87.331 - Chronic venous hypertension (idiopathic) with ulcer and inflammation of right lower extremity E11.622 - Type 2 diabetes mellitus with other skin ulcer I89.0 - Lymphedema, not elsewhere classified Procedures Wound #1 Pre-procedure diagnosis of Wound #1 is a Diabetic Wound/Ulcer of the Lower Extremity located on the Right,Posterior Lower Leg . There was a Four Layer Compression Therapy Procedure with a pre-treatment ABI of 1 by Cornell Barman, RN. Post procedure Diagnosis Wound #1: Same as Pre-Procedure HONG, MORING ([id number]) Plan Wound Cleansing: Wound #1 Right,Posterior Lower Leg: Cleanse wound with mild soap and water Wound #2 Right,Anterior Lower Leg: Cleanse wound with mild soap and water Anesthetic (add to Medication List): Wound #1 Right,Posterior Lower Leg: Topical Lidocaine 4% cream applied to wound bed prior to debridement (In Clinic Only). Wound #2 Right,Anterior Lower Leg: Topical Lidocaine 4% cream applied to wound bed prior to debridement (In Clinic Only). Skin Barriers/Peri-Wound Care: Wound #1 Right,Posterior Lower Leg: Triamcinolone Acetonide Ointment (TAC) Wound #2 Right,Anterior Lower Leg: Triamcinolone Acetonide Ointment (TAC) Primary Wound Dressing: Wound #1 Right,Posterior Lower Leg: Silver Alginate Wound #2 Right,Anterior Lower Leg: Silver Alginate Secondary Dressing: Wound #1 Right,Posterior  Lower Leg: Other - Absorptive dressing Wound #2 Right,Anterior Lower Leg: Other - Absorptive dressing Dressing Change Frequency: Wound #1 Right,Posterior Lower Leg: Change dressing every week Wound #2 Right,Anterior Lower Leg: Change dressing every week Follow-up Appointments: Wound #1 Right,Posterior Lower Leg: Return Appointment in 1 week. Nurse Visit as needed Wound #2 Right,Anterior Lower Leg: Return Appointment in 1 week. Nurse Visit as needed Edema Control: Wound #1 Right,Posterior Lower Leg: 4-Layer Compression System - Right Lower Extremity Elevate legs to the level of the heart and pump ankles as often as possible Wound #2 Right,Anterior Lower Leg: 4-Layer Compression System - Right Lower Extremity Elevate legs to the level of  the heart and pump ankles as often as possible Additional Orders / Instructions: Wound #1 Right,Posterior Lower Leg: Activity as tolerated Wound #2 Right,Anterior Lower Leg: Activity as tolerated Services and Therapies ordered were: Arterial Studies- Bilateral General Notes: Triple antibiotic cream and cover dressing on knee and elbow. KARRINGTON, STUDNICKA ([id number]) #1we are continuing with triamcinolone and silver alginate #21 when ahead and ordered venous reflux studies specifically looking at the great saphenous vein on the right #3 superficial injuries on the left knee left elbow can simply be managed with topical antibiotics Electronic Signature(s) Signed: 04/07/2018 5:40:08 PM By: Linton Ham MD Entered By: Linton Ham on 04/07/2018 13:07:51 [name] ([id number]) -------------------------------------------------------------------------------- SuperBill Details Patient Name: [name] Date of Service: 04/07/2018 Medical Record Number: [id number] Patient Account Number: [id number] Date of Birth/Sex: 1961/11/08 (57 y.o. M) Treating RN: Cornell Barman Primary Care Provider: [name] Other Clinician: Referring Provider:  [name] Treating Provider/Extender: Tito Dine in Treatment: 2 Diagnosis Coding ICD-10 Codes Code Description [phone] Non-pressure chronic ulcer of right calf limited to breakdown of skin I87.331 Chronic venous hypertension (idiopathic) with ulcer and inflammation of right lower extremity E11.622 Type 2 diabetes mellitus with other skin ulcer I89.0 Lymphedema, not elsewhere classified Facility Procedures CPT4 Code Description: [id number] (Facility Use Only) [phone] - [place] RT LEG Modifier: Quantity: 1 Physician Procedures CPT4: Description Modifier Quantity Code [id number] - WC PHYS LEVEL 3 - EST PT 1 ICD-10 Diagnosis Description L97.211 Non-pressure chronic ulcer of right calf limited to breakdown of skin I87.331 Chronic venous hypertension (idiopathic) with ulcer and  inflammation of right lower extremity I89.0 Lymphedema, not elsewhere classified Electronic Signature(s) Signed: 04/07/2018 5:40:08 PM By: Linton Ham MD Previous Signature: 04/07/2018 12:27:24 PM Version By: Gretta Cool, BSN, RN, CWS, Kim RN, BSN Entered By: Linton Ham on 04/07/2018 13:08:31

## 2018-04-12 NOTE — Telephone Encounter (Signed)
Ok for refill? 

## 2018-04-14 ENCOUNTER — Encounter: Payer: Medicare HMO | Attending: Internal Medicine | Admitting: Internal Medicine

## 2018-04-14 DIAGNOSIS — I87331 Chronic venous hypertension (idiopathic) with ulcer and inflammation of right lower extremity: Secondary | ICD-10-CM | POA: Diagnosis not present

## 2018-04-14 DIAGNOSIS — W19XXXA Unspecified fall, initial encounter: Secondary | ICD-10-CM | POA: Diagnosis not present

## 2018-04-14 DIAGNOSIS — E11622 Type 2 diabetes mellitus with other skin ulcer: Secondary | ICD-10-CM | POA: Insufficient documentation

## 2018-04-14 DIAGNOSIS — I890 Lymphedema, not elsewhere classified: Secondary | ICD-10-CM | POA: Diagnosis not present

## 2018-04-14 DIAGNOSIS — S81012A Laceration without foreign body, left knee, initial encounter: Secondary | ICD-10-CM | POA: Insufficient documentation

## 2018-04-14 DIAGNOSIS — L97819 Non-pressure chronic ulcer of other part of right lower leg with unspecified severity: Secondary | ICD-10-CM | POA: Insufficient documentation

## 2018-04-14 DIAGNOSIS — L97211 Non-pressure chronic ulcer of right calf limited to breakdown of skin: Secondary | ICD-10-CM | POA: Diagnosis not present

## 2018-04-14 DIAGNOSIS — L97212 Non-pressure chronic ulcer of right calf with fat layer exposed: Secondary | ICD-10-CM | POA: Diagnosis not present

## 2018-04-14 NOTE — Progress Notes (Signed)
OSIEL, STICK ([id number]) Visit Report for 04/07/2018 Arrival Information Details Patient Name: [name], [name] Date of Service: 04/07/2018 11:15 AM Medical Record Number: [id number] Patient Account Number: [id number] Date of Birth/Sex: March 31, 1961 (57 y.o. M) Treating RN: Roger Shelter Primary Care Srinivas Lippman: [name] Other Clinician: Referring Kenley Rettinger: [name] Treating Tauna Macfarlane/Extender: Tito Dine in Treatment: 2 Visit Information History Since Last Visit All ordered tests and consults were completed: No Patient Arrived: Cane Added or deleted any medications: No Arrival Time: 11:37 Any new allergies or adverse reactions: No Accompanied By: daughter Had a fall or experienced change in Yes Transfer Assistance: None activities of daily living that may affect Patient Identification Verified: Yes risk of falls: Secondary Verification Process Completed: Yes Signs or symptoms of abuse/neglect since last visito No Patient Requires Transmission-Based Precautions: No Hospitalized since last visit: No Patient Has Alerts: Yes Implantable device outside of the clinic excluding No Patient Alerts: DMII cellular tissue based products placed in the center since last visit: Pain Present Now: No Electronic Signature(s) Signed: 04/07/2018 4:28:16 PM By: Roger Shelter Entered By: Roger Shelter on 04/07/2018 11:38:07 [name] ([id number]) -------------------------------------------------------------------------------- Compression Therapy Details Patient Name: [name] Date of Service: 04/07/2018 11:15 AM Medical Record Number: [id number] Patient Account Number: [id number] Date of Birth/Sex: 08/24/1961 (57 y.o. M) Treating RN: Cornell Barman Primary Care Kamran Coker: [name] Other Clinician: Referring Kita Neace: [name] Treating Ameenah Prosser/Extender: Tito Dine in Treatment: 2 Compression Therapy Performed for Wound Assessment: Wound  #1 Right,Posterior Lower Leg Performed By: Clinician Cornell Barman, RN Compression Type: Four Layer Pre Treatment ABI: 1 Post Procedure Diagnosis Same as Pre-procedure Electronic Signature(s) Signed: 04/07/2018 12:27:24 PM By: Gretta Cool, BSN, RN, CWS, Kim RN, BSN Entered By: Gretta Cool, BSN, RN, CWS, Kim on 04/07/2018 12:02:05 [name] ([id number]) -------------------------------------------------------------------------------- Encounter Discharge Information Details Patient Name: [name] Date of Service: 04/07/2018 11:15 AM Medical Record Number: [id number] Patient Account Number: [id number] Date of Birth/Sex: May 03, 1961 (57 y.o. M) Treating RN: Cornell Barman Primary Care Aniesa Boback: [name] Other Clinician: Referring Peni Rupard: [name] Treating Jereline Ticer/Extender: Tito Dine in Treatment: 2 Encounter Discharge Information Items Discharge Pain Level: 0 Discharge Condition: Stable Ambulatory Status: Ambulatory Discharge Destination: Home Transportation: Private Auto Accompanied By: daughter Schedule Follow-up Appointment: Yes Medication Reconciliation completed and No provided to Patient/Care Taleya Whitcher: Patient Clinical Summary of Care: Declined Electronic Signature(s) Signed: 04/12/2018 10:08:32 AM By: Ruthine Dose Previous Signature: 04/07/2018 12:27:24 PM Version By: Gretta Cool, BSN, RN, CWS, Kim RN, BSN Entered By: Ruthine Dose on 04/07/2018 12:44:46 [name] ([id number]) -------------------------------------------------------------------------------- Lower Extremity Assessment Details Patient Name: [name] Date of Service: 04/07/2018 11:15 AM Medical Record Number: [id number] Patient Account Number: [id number] Date of Birth/Sex: [birth year] (57 y.o. M) Treating RN: Roger Shelter Primary Care Kaylon Hitz: [name] Other Clinician: Referring Baylen Buckner: [name] Treating Raizel Wesolowski/Extender: Tito Dine in Treatment: 2 Edema  Assessment Assessed: [Left: No] [Right: No] [Left: Edema] [Right: :] Calf Left: Right: Point of Measurement: 36 cm From Medial Instep cm 52.8 cm Ankle Left: Right: Point of Measurement: 14 cm From Medial Instep cm 28.7 cm Vascular Assessment Claudication: Claudication Assessment [Right:None] Pulses: Dorsalis Pedis Palpable: [Right:Yes] Posterior Tibial Extremity colors, hair growth, and conditions: Extremity Color: [Right:Hyperpigmented] Hair Growth on Extremity: [Right:Yes] Temperature of Extremity: [Right:Warm] Capillary Refill: [Right:< 3 seconds] Toe Nail Assessment Left: Right: Thick: Yes Discolored: Yes Deformed: No Improper Length and Hygiene: No Electronic Signature(s) Signed: 04/07/2018 4:28:16 PM By: Roger Shelter Entered By: Claudina Lick  Cheryl on 04/07/2018 11:52:13 TRES, GRZYWACZ ([id number]) -------------------------------------------------------------------------------- Multi Wound Chart Details Patient Name: Hines, Christopher Date of Service: 04/07/2018 11:15 AM Medical Record Number: [id number] Patient Account Number: [id number] Date of Birth/Sex: 07/14/1961 (57 y.o. M) Treating RN: Cornell Barman Primary Care Anias Bartol: [name] Other Clinician: Referring Kiamesha Samet: [name] Treating Pairlee Sawtell/Extender: Tito Dine in Treatment: 2 Vital Signs Height(in): 75 Pulse(bpm): 93 Weight(lbs): 343 Blood Pressure(mmHg): 169/76 Body Mass Index(BMI): 43 Temperature(F): 98.0 Respiratory Rate 18 (breaths/min): Photos: [1:No Photos] [2:No Photos] [N/A:N/A] Wound Location: [1:Right Lower Leg - Posterior] [2:Right Lower Leg - Anterior] [N/A:N/A] Wounding Event: [1:Trauma] [2:Trauma] [N/A:N/A] Primary Etiology: [1:Diabetic Wound/Ulcer of the Lower Extremity] [2:Diabetic Wound/Ulcer of the Lower Extremity] [N/A:N/A] Secondary Etiology: [1:Venous Leg Ulcer] [2:Venous Leg Ulcer] [N/A:N/A] Comorbid History: [1:Hypertension, Type II Diabetes, Neuropathy]  [2:Hypertension, Type II Diabetes, Neuropathy] [N/A:N/A] Date Acquired: [1:11/23/2017] [2:11/23/2017] [N/A:N/A] Weeks of Treatment: [1:2] [2:2] [N/A:N/A] Wound Status: [1:Open] [2:Open] [N/A:N/A] Measurements L x W x D [1:6.5x6.9x0.1] [2:0.3x1.5x0.1] [N/A:N/A] (cm) Area (cm) : [1:35.225] [2:0.353] [N/A:N/A] Volume (cm) : [1:3.523] [2:0.035] [N/A:N/A] % Reduction in Area: [1:-46.00%] [2:91.50%] [N/A:N/A] % Reduction in Volume: [1:-46.00%] [2:91.60%] [N/A:N/A] Classification: [1:Grade 1] [2:Grade 1] [N/A:N/A] Exudate Amount: [1:Large] [2:Large] [N/A:N/A] Exudate Type: [1:Serous] [2:Serous] [N/A:N/A] Exudate Color: [1:amber] [2:amber] [N/A:N/A] Wound Margin: [1:Flat and Intact] [2:Flat and Intact] [N/A:N/A] Granulation Amount: [1:Medium (34-66%)] [2:Large (67-100%)] [N/A:N/A] Granulation Quality: [1:Red] [2:Red] [N/A:N/A] Necrotic Amount: [1:Medium (34-66%)] [2:Small (1-33%)] [N/A:N/A] Exposed Structures: [1:Fat Layer (Subcutaneous Tissue) Exposed: Yes Fascia: No Tendon: No Muscle: No Joint: No Bone: No] [2:Fascia: No Fat Layer (Subcutaneous Tissue) Exposed: No Tendon: No Muscle: No Joint: No Bone: No] [N/A:N/A] Epithelialization: [1:Small (1-33%)] [2:Small (1-33%)] [N/A:N/A] Periwound Skin Texture: [1:Excoriation: Yes Induration: No Callus: No Crepitus: No] [2:Excoriation: Yes Induration: No Callus: No Crepitus: No] [N/A:N/A] Rash: No Rash: No Scarring: No Scarring: No Periwound Skin Moisture: Maceration: No Maceration: No N/A Dry/Scaly: No Dry/Scaly: No Periwound Skin Color: Hemosiderin Staining: Yes Hemosiderin Staining: Yes N/A Atrophie Blanche: No Atrophie Blanche: No Cyanosis: No Cyanosis: No Ecchymosis: No Ecchymosis: No Erythema: No Erythema: No Mottled: No Mottled: No Pallor: No Pallor: No Rubor: No Rubor: No Temperature: No Abnormality No Abnormality N/A Tenderness on Palpation: No No N/A Wound Preparation: Ulcer Cleansing: Ulcer Cleansing:  N/A Rinsed/Irrigated with Saline, Rinsed/Irrigated with Saline, Other: soap and water Other: soap and water Topical Anesthetic Applied: Topical Anesthetic Applied: Other: lidocaine 4% Other: lidocaine 4% Procedures Performed: Compression Therapy N/A N/A Treatment Notes Wound #1 (Right, Posterior Lower Leg) 1. Cleansed with: Clean wound with Normal Saline Cleanse wound with antibacterial soap and water 2. Anesthetic Topical Lidocaine 4% cream to wound bed prior to debridement 3. Peri-wound Care: Other peri-wound care (specify in notes) 4. Dressing Applied: Other dressing (specify in notes) 5. Secondary Dressing Applied ABD Pad 7. Secured with 4-Layer Compression System - Right Lower Extremity Notes tca cream, silvercell, xtrasorb, Wound #2 (Right, Anterior Lower Leg) 1. Cleansed with: Clean wound with Normal Saline Cleanse wound with antibacterial soap and water 2. Anesthetic Topical Lidocaine 4% cream to wound bed prior to debridement 3. Peri-wound Care: Other peri-wound care (specify in notes) 4. Dressing Applied: Other dressing (specify in notes) 5. Secondary Dressing Applied ABD Pad 7. Secured with 4-Layer Compression System - Right Lower Extremity MARKIAN, GLOCKNER ([id number]) Notes tca cream, silvercell, xtrasorb, Electronic Signature(s) Signed: 04/07/2018 5:40:08 PM By: Linton Ham MD Previous Signature: 04/07/2018 12:27:24 PM Version By: Gretta Cool, BSN, RN, CWS, Kim RN, BSN Entered By: Linton Ham on 04/07/2018 13:03:22 Rodenberg,  NAS WAFER ([id number]) -------------------------------------------------------------------------------- Multi-Disciplinary Care Plan Details Patient Name: SHAKEEM, STERN Date of Service: 04/07/2018 11:15 AM Medical Record Number: [id number] Patient Account Number: [id number] Date of Birth/Sex: May 31, 1961 (57 y.o. M) Treating RN: Cornell Barman Primary Care Davontay Watlington: [name] Other Clinician: Referring Branna Cortina: [name] Treating  Barnie Sopko/Extender: Tito Dine in Treatment: 2 Active Inactive ` Orientation to the Wound Care Program Nursing Diagnoses: Knowledge deficit related to the wound healing center program Goals: Patient/caregiver will verbalize understanding of the [place] Program Date Initiated: 03/24/2018 Target Resolution Date: 04/23/2018 Goal Status: Active Interventions: Provide education on orientation to the wound center Notes: ` Soft Tissue Infection Nursing Diagnoses: Potential for infection: soft tissue Goals: Patient will remain free of wound infection Date Initiated: 03/24/2018 Target Resolution Date: 04/23/2018 Goal Status: Active Interventions: Assess signs and symptoms of infection every visit Treatment Activities: Systemic antibiotics : 03/24/2018 Notes: ` Wound/Skin Impairment Nursing Diagnoses: Impaired tissue integrity Goals: Ulcer/skin breakdown will have a volume reduction of 80% by week 12 Date Initiated: 03/24/2018 Target Resolution Date: 07/24/2018 TAIJON, VINK ([id number]) Goal Status: Active Ulcer/skin breakdown will heal within 14 weeks Date Initiated: 03/24/2018 Target Resolution Date: 07/24/2018 Goal Status: Active Interventions: Assess ulceration(s) every visit Treatment Activities: Referred to DME Eman Rynders for dressing supplies : 03/24/2018 Topical wound management initiated : 03/24/2018 Notes: Electronic Signature(s) Signed: 04/07/2018 12:27:24 PM By: Gretta Cool, BSN, RN, CWS, Kim RN, BSN Entered By: Gretta Cool, BSN, RN, CWS, Kim on 04/07/2018 11:57:27 [name] ([id number]) -------------------------------------------------------------------------------- Pain Assessment Details Patient Name: [name] Date of Service: 04/07/2018 11:15 AM Medical Record Number: [id number] Patient Account Number: [id number] Date of Birth/Sex: [birth year] (57 y.o. M) Treating RN: Roger Shelter Primary Care Kelsy Polack: [name] Other  Clinician: Referring Billye Nydam: [name] Treating Tawania Daponte/Extender: Tito Dine in Treatment: 2 Active Problems Location of Pain Severity and Description of Pain Patient Has Paino No Site Locations Pain Management and Medication Current Pain Management: Electronic Signature(s) Signed: 04/07/2018 4:28:16 PM By: Roger Shelter Entered By: Roger Shelter on 04/07/2018 11:38:16 [name] ([id number]) -------------------------------------------------------------------------------- Patient/Caregiver Education Details Patient Name: [name] Date of Service: 04/07/2018 11:15 AM Medical Record Number: [id number] Patient Account Number: [id number] Date of Birth/Gender: 31-Aug-1961 (57 y.o. M) Treating RN: Cornell Barman Primary Care Physician: [name] Other Clinician: Referring Physician: [name] Treating Physician/Extender: Tito Dine in Treatment: 2 Education Assessment Education Provided To: Patient Education Topics Provided Venous: Handouts: Controlling Swelling with Compression Stockings Methods: Demonstration, Explain/Verbal Responses: State content correctly Wound/Skin Impairment: Handouts: Caring for Your Ulcer Methods: Demonstration Responses: State content correctly Electronic Signature(s) Signed: 04/07/2018 12:27:24 PM By: Gretta Cool, BSN, RN, CWS, Kim RN, BSN Entered By: Gretta Cool, BSN, RN, CWS, Kim on 04/07/2018 12:19:10 [name] ([id number]) -------------------------------------------------------------------------------- Wound Assessment Details Patient Name: [name] Date of Service: 04/07/2018 11:15 AM Medical Record Number: [id number] Patient Account Number: [id number] Date of Birth/Sex: 1961-07-08 (57 y.o. M) Treating RN: Roger Shelter Primary Care Shinika Estelle: [name] Other Clinician: Referring Shequila Neglia: [name] Treating Cypress Hinkson/Extender: Tito Dine in Treatment: 2 Wound  Status Wound Number: 1 Primary Etiology: Diabetic Wound/Ulcer of the Lower Extremity Wound Location: Right Lower Leg - Posterior Secondary Venous Leg Ulcer Wounding Event: Trauma Etiology: Date Acquired: 11/23/2017 Wound Status: Open Weeks Of Treatment: 2 Comorbid History: Hypertension, Type II Diabetes, Clustered Wound: No Neuropathy Photos Photo Uploaded By: Roger Shelter on 04/07/2018 17:02:29 Wound Measurements Length: (cm) 6.5 Width: (cm) 6.9 Depth: (cm) 0.1 Area: (cm) 35.225  Volume: (cm) 3.523 % Reduction in Area: -46% % Reduction in Volume: -46% Epithelialization: Small (1-33%) Tunneling: No Undermining: No Wound Description Classification: Grade 1 Wound Margin: Flat and Intact Exudate Amount: Large Exudate Type: Serous Exudate Color: amber Foul Odor After Cleansing: No Slough/Fibrino Yes Wound Bed Granulation Amount: Medium (34-66%) Exposed Structure Granulation Quality: Red Fascia Exposed: No Necrotic Amount: Medium (34-66%) Fat Layer (Subcutaneous Tissue) Exposed: Yes Necrotic Quality: Adherent Slough Tendon Exposed: No Muscle Exposed: No Joint Exposed: No Bone Exposed: No Periwound Skin Texture Rowley, Neiman K. ([id number]) Texture Color No Abnormalities Noted: No No Abnormalities Noted: No Callus: No Atrophie Blanche: No Crepitus: No Cyanosis: No Excoriation: Yes Ecchymosis: No Induration: No Erythema: No Rash: No Hemosiderin Staining: Yes Scarring: No Mottled: No Pallor: No Moisture Rubor: No No Abnormalities Noted: No Dry / Scaly: No Temperature / Pain Maceration: No Temperature: No Abnormality Wound Preparation Ulcer Cleansing: Rinsed/Irrigated with Saline, Other: soap and water, Topical Anesthetic Applied: Other: lidocaine 4%, Treatment Notes Wound #1 (Right, Posterior Lower Leg) 1. Cleansed with: Clean wound with Normal Saline Cleanse wound with antibacterial soap and water 2. Anesthetic Topical Lidocaine 4% cream  to wound bed prior to debridement 3. Peri-wound Care: Other peri-wound care (specify in notes) 4. Dressing Applied: Other dressing (specify in notes) 5. Secondary Dressing Applied ABD Pad 7. Secured with 4-Layer Compression System - Right Lower Extremity Notes tca cream, silvercell, xtrasorb, Electronic Signature(s) Signed: 04/07/2018 4:28:16 PM By: Roger Shelter Entered By: Roger Shelter on 04/07/2018 11:49:48 [name] ([id number]) -------------------------------------------------------------------------------- Wound Assessment Details Patient Name: [name] Date of Service: 04/07/2018 11:15 AM Medical Record Number: [id number] Patient Account Number: [id number] Date of Birth/Sex: 09-04-1961 (57 y.o. M) Treating RN: Roger Shelter Primary Care Tavie Haseman: [name] Other Clinician: Referring Treesa Mccully: [name] Treating Toshiye Kever/Extender: Tito Dine in Treatment: 2 Wound Status Wound Number: 2 Primary Etiology: Diabetic Wound/Ulcer of the Lower Extremity Wound Location: Right Lower Leg - Anterior Secondary Venous Leg Ulcer Wounding Event: Trauma Etiology: Date Acquired: 11/23/2017 Wound Status: Open Weeks Of Treatment: 2 Comorbid History: Hypertension, Type II Diabetes, Clustered Wound: No Neuropathy Photos Photo Uploaded By: Roger Shelter on 04/07/2018 17:02:30 Wound Measurements Length: (cm) 0.3 Width: (cm) 1.5 Depth: (cm) 0.1 Area: (cm) 0.353 Volume: (cm) 0.035 % Reduction in Area: 91.5% % Reduction in Volume: 91.6% Epithelialization: Small (1-33%) Tunneling: No Undermining: No Wound Description Classification: Grade 1 Wound Margin: Flat and Intact Exudate Amount: Large Exudate Type: Serous Exudate Color: amber Foul Odor After Cleansing: No Slough/Fibrino Yes Wound Bed Granulation Amount: Large (67-100%) Exposed Structure Granulation Quality: Red Fascia Exposed: No Necrotic Amount: Small (1-33%) Fat Layer  (Subcutaneous Tissue) Exposed: No Necrotic Quality: Adherent Slough Tendon Exposed: No Muscle Exposed: No Joint Exposed: No Bone Exposed: No Periwound Skin Texture Wetherbee, Slyvester K. ([id number]) Texture Color No Abnormalities Noted: No No Abnormalities Noted: No Callus: No Atrophie Blanche: No Crepitus: No Cyanosis: No Excoriation: Yes Ecchymosis: No Induration: No Erythema: No Rash: No Hemosiderin Staining: Yes Scarring: No Mottled: No Pallor: No Moisture Rubor: No No Abnormalities Noted: No Dry / Scaly: No Temperature / Pain Maceration: No Temperature: No Abnormality Wound Preparation Ulcer Cleansing: Rinsed/Irrigated with Saline, Other: soap and water, Topical Anesthetic Applied: Other: lidocaine 4%, Treatment Notes Wound #2 (Right, Anterior Lower Leg) 1. Cleansed with: Clean wound with Normal Saline Cleanse wound with antibacterial soap and water 2. Anesthetic Topical Lidocaine 4% cream to wound bed prior to debridement 3. Peri-wound Care: Other peri-wound care (specify in notes) 4. Dressing  Applied: Other dressing (specify in notes) 5. Secondary Dressing Applied ABD Pad 7. Secured with 4-Layer Compression System - Right Lower Extremity Notes tca cream, silvercell, xtrasorb, Electronic Signature(s) Signed: 04/07/2018 4:28:16 PM By: Roger Shelter Entered By: Roger Shelter on 04/07/2018 11:52:42 [name] ([id number]) -------------------------------------------------------------------------------- Vitals Details Patient Name: [name] Date of Service: 04/07/2018 11:15 AM Medical Record Number: [id number] Patient Account Number: [id number] Date of Birth/Sex: Apr 18, 1961 (57 y.o. M) Treating RN: Roger Shelter Primary Care Atwell Mcdanel: [name] Other Clinician: Referring Airelle Everding: [name] Treating Chenoa Luddy/Extender: Tito Dine in Treatment: 2 Vital Signs Time Taken: 11:38 Temperature (F): 98.0 Height (in):  75 Pulse (bpm): 93 Weight (lbs): 343 Respiratory Rate (breaths/min): 18 Body Mass Index (BMI): 42.9 Blood Pressure (mmHg): 169/76 Reference Range: 80 - 120 mg / dl Electronic Signature(s) Signed: 04/07/2018 4:28:16 PM By: Roger Shelter Entered By: Roger Shelter on 04/07/2018 11:40:26

## 2018-04-17 NOTE — Progress Notes (Signed)
Christopher, MATSUO ([id number]) Visit Report for 04/14/2018 Debridement Details Patient Name: [name], [name] Date of Service: 04/14/2018 9:00 AM Medical Record Number: [id number] Patient Account Number: [id number] Date of Birth/Sex: 1961-11-30 (57 y.o. M) Treating RN: Montey Hora Primary Care Provider: [name] Other Clinician: Referring Provider: [name] Treating Provider/Extender: Tito Dine in Treatment: 3 Debridement Performed for Wound #1 Right,Posterior Lower Leg Assessment: Performed By: Physician Ricard Dillon, MD Debridement Type: Debridement Severity of Tissue Pre Fat layer exposed Debridement: Pre-procedure Verification/Time Yes - 09:38 Out Taken: Start Time: 09:38 Pain Control: Lidocaine 4% Topical Solution Total Area Debrided (L x W): 3 (cm) x 3 (cm) = 9 (cm) Tissue and other material Viable, Non-Viable, Slough, Subcutaneous, Biofilm, Fibrin/Exudate, Slough debrided: Level: Skin/Subcutaneous Tissue Debridement Description: Excisional Instrument: Curette Bleeding: Minimum Hemostasis Achieved: Pressure End Time: 09:42 Procedural Pain: 0 Post Procedural Pain: 0 Response to Treatment: Procedure was tolerated well Post Debridement Measurements of Total Wound Length: (cm) 6.5 Width: (cm) 6.5 Depth: (cm) 0.2 Volume: (cm) 6.637 Character of Wound/Ulcer Post Debridement: Improved Severity of Tissue Post Debridement: Fat layer exposed Post Procedure Diagnosis Same as Pre-procedure Electronic Signature(s) Signed: 04/14/2018 4:28:35 PM By: Linton Ham MD Signed: 04/14/2018 4:48:19 PM By: Montey Hora Entered By: Linton Ham on 04/14/2018 10:18:17 [name] ([id number]) -------------------------------------------------------------------------------- HPI Details Patient Name: [name] Date of Service: 04/14/2018 9:00 AM Medical Record Number: [id number] Patient Account Number: [id number] Date of Birth/Sex: 15-Apr-1961 (57  y.o. M) Treating RN: Montey Hora Primary Care Provider: [name] Other Clinician: Referring Provider: [name] Treating Provider/Extender: Tito Dine in Treatment: 3 History of Present Illness HPI Description: 03/24/18; patient is a 57 year old type II diabetic on oral agents. He tells me he has right foot drop secondary to previous back surgery had some years ago. He wears a brace on the right leg and walks with a quad cane. He had the brace adjusted in December/18 and apparently did not fit properly and he developed wounds predominantly on the posterior right calf where the brace rubbed his leg. The patient describes this as acute he noticed it right away and was able to have the brace readjusted. He subsequently psoas had some wounds on the anterior right leg which she says are tape abrasion injuries. His primary doctor is Dr. Rex Kras at [facility]. It looks as though Dr. Rex Kras saw this initially in January. Started him on cephalexin. Patient was seen again on 03/19/17 without much improvement. He has been using Neosporin to the wound areas. He was started on Augmentin on 03/19/18. The patient does not have a wound history. He has a history of a fractured left patella in 2017. Chronic foot drop on the right apparently related to back surgery. Hyperlipidemia and essential hypertension. 03/24/18; the patient still has a scattering of wounds on the anterior medial extending posteriorly in the right leg. The advertising as this is being secondary to abrasion injury although there is a lot of irritation here and one would have to wonder whether this represents chronic venous inflammation/stasis dermatitis. In any case of that's true it was not well recognized. 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 He is completing Augmentin at this point I don't think he needs additional antibiotics. we applied silver alginate under 4-layer compression which the  patient tolerated well 04/07/18; the patient's major wound areas on the medial right leg. This is in the setting of chronic venous insufficiency/stasis dermatitis and lymphedema. We applied silver alginate  under for layer compression. He's been scratching at this through the stop under the stockings. He has superficial wounds just below the anterior stocking line. He also fell and the [employer] parking lot superficial "road burn" on the left knee and left elbow. We did not the define these areas 04/14/18; since wounds on the right leg look better. He still requires debridement especially on the lower medial calf area we've been using silver alginate under compressionat 4 layer. One of the excoriations from the fall last week had a greenish drainage per her intake nurse. This is on the left knee. Electronic Signature(s) Signed: 04/14/2018 4:28:35 PM By: Linton Ham MD Entered By: Linton Ham on 04/14/2018 10:10:22 [name] ([id number]) -------------------------------------------------------------------------------- Physical Exam Details Patient Name: [name] Date of Service: 04/14/2018 9:00 AM Medical Record Number: [id number] Patient Account Number: [id number] Date of Birth/Sex: Oct 22, 1961 (57 y.o. M) Treating RN: Montey Hora Primary Care Provider: [name] Other Clinician: Referring Provider: [name] Treating Provider/Extender: Tito Dine in Treatment: 3 Constitutional Sitting or standing Blood Pressure is within target range for patient.. Pulse regular and within target range for patient.Marland Kitchen Respirations regular, non-labored and within target range.. Temperature is normal and within the target range for the patient.Marland Kitchen appears in no distress. Notes wound exam; the area on the right anterior leg looks just about healed. The cluster medially surrounded by venous insufficiency and lymphedema also looks better. 2 areas still required debridement with a #5  curette to remove eschar and nonviable subcutaneous tissue. the wound bed looks stable here. oOn the left anterior knee he has a superficial excoriated/road rash type injury. There is no evidence of infection here. Electronic Signature(s) Signed: 04/14/2018 4:28:35 PM By: Linton Ham MD Entered By: Linton Ham on 04/14/2018 10:14:01 [name] ([id number]) -------------------------------------------------------------------------------- Physician Orders Details Patient Name: [name] Date of Service: 04/14/2018 9:00 AM Medical Record Number: [id number] Patient Account Number: [id number] Date of Birth/Sex: 03-24-1961 (57 y.o. M) Treating RN: Montey Hora Primary Care Provider: [name] Other Clinician: Referring Provider: [name] Treating Provider/Extender: Tito Dine in Treatment: 3 Verbal / Phone Orders: No Diagnosis Coding Wound Cleansing Wound #1 Right,Posterior Lower Leg o Cleanse wound with mild soap and water Wound #2 Right,Anterior Lower Leg o Cleanse wound with mild soap and water Wound #3 Right,Proximal,Anterior Lower Leg o Cleanse wound with mild soap and water Wound #4 Left Knee o Cleanse wound with mild soap and water Anesthetic (add to Medication List) Wound #1 Right,Posterior Lower Leg o Topical Lidocaine 4% cream applied to wound bed prior to debridement (In Clinic Only). Wound #2 Right,Anterior Lower Leg o Topical Lidocaine 4% cream applied to wound bed prior to debridement (In Clinic Only). Wound #3 Right,Proximal,Anterior Lower Leg o Topical Lidocaine 4% cream applied to wound bed prior to debridement (In Clinic Only). Wound #4 Left Knee o Topical Lidocaine 4% cream applied to wound bed prior to debridement (In Clinic Only). Skin Barriers/Peri-Wound Care Wound #1 Right,Posterior Lower Leg o Triamcinolone Acetonide Ointment (TAC) Wound #2 Right,Anterior Lower Leg o Triamcinolone Acetonide Ointment  (TAC) Wound #3 Right,Proximal,Anterior Lower Leg o Triamcinolone Acetonide Ointment (TAC) Primary Wound Dressing Wound #1 Right,Posterior Lower Leg o Silver Alginate Wound #2 Right,Anterior Lower Leg o Silver Alginate Wound #3 Right,Proximal,Anterior Lower Leg THEDFORD, BUNTON. ([id number]) o Silver Alginate Wound #4 Left Knee o Silver Alginate Secondary Dressing Wound #1 Right,Posterior Lower Leg o ABD pad o Other - Absorptive dressing Wound #2 Right,Anterior Lower Leg o  ABD pad o Other - Absorptive dressing Wound #3 Right,Proximal,Anterior Lower Leg o ABD pad o Other - Absorptive dressing Wound #4 Left Knee o Boardered Foam Dressing Dressing Change Frequency Wound #1 Right,Posterior Lower Leg o Change dressing every week Wound #2 Right,Anterior Lower Leg o Change dressing every week Wound #3 Right,Proximal,Anterior Lower Leg o Change dressing every week Wound #4 Left Knee o Change dressing every day. Follow-up Appointments Wound #1 Right,Posterior Lower Leg o Return Appointment in 1 week. o Nurse Visit as needed Wound #2 Right,Anterior Lower Leg o Return Appointment in 1 week. o Nurse Visit as needed Wound #3 Right,Proximal,Anterior Lower Leg o Return Appointment in 1 week. o Nurse Visit as needed Wound #4 Left Knee o Return Appointment in 1 week. o Nurse Visit as needed Edema Control Wound #1 Right,Posterior Lower Leg o 4-Layer Compression System - Right Lower Extremity o Elevate legs to the level of the heart and pump ankles as often as possible FOY, VANDUYNE. ([id number]) Wound #2 Right,Anterior Lower Leg o 4-Layer Compression System - Right Lower Extremity o Elevate legs to the level of the heart and pump ankles as often as possible Wound #3 Right,Proximal,Anterior Lower Leg o 4-Layer Compression System - Right Lower Extremity o Elevate legs to the level of the heart and pump ankles as often as  possible Wound #4 Left Knee o Patient to wear own compression stockings o Elevate legs to the level of the heart and pump ankles as often as possible Additional Orders / Instructions Wound #1 Right,Posterior Lower Leg o Activity as tolerated Wound #2 Right,Anterior Lower Leg o Activity as tolerated Wound #3 Right,Proximal,Anterior Lower Leg o Activity as tolerated Wound #4 Left Knee o Activity as tolerated Electronic Signature(s) Signed: 04/14/2018 4:28:35 PM By: Linton Ham MD Signed: 04/14/2018 4:48:19 PM By: Montey Hora Entered By: Montey Hora on 04/14/2018 09:46:26 [name] ([id number]) -------------------------------------------------------------------------------- Problem List Details Patient Name: [name] Date of Service: 04/14/2018 9:00 AM Medical Record Number: [id number] Patient Account Number: [id number] Date of Birth/Sex: 02/04/1961 (57 y.o. 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 I87.331 Chronic venous hypertension (idiopathic) with ulcer and 03/24/2018 Yes inflammation of right lower extremity E11.622 Type 2 diabetes mellitus with other skin ulcer 03/24/2018 Yes I89.0 Lymphedema, not elsewhere classified 03/24/2018 Yes S81.012D Laceration without foreign body, left knee, subsequent 04/14/2018 Yes encounter Inactive Problems Resolved Problems Electronic Signature(s) Signed: 04/14/2018 4:28:35 PM By: Linton Ham MD Entered By: Linton Ham on 04/14/2018 10:11:07 [name] ([id number]) -------------------------------------------------------------------------------- Progress Note Details Patient  Name: [name] Date of Service: 04/14/2018 9:00 AM Medical Record Number: [id number] Patient Account Number: [id number] Date of Birth/Sex: September 17, 1961 (57 y.o. M) Treating RN: Montey Hora Primary Care Provider: [name] Other Clinician: Referring Provider: [name] Treating Provider/Extender: Tito Dine in Treatment: 3 Subjective History of Present Illness (HPI) 03/24/18; patient is a 57 year old type II diabetic on oral agents. He tells me he has right foot drop secondary to previous back surgery had some years ago. He wears a brace on the right leg and walks with a quad cane. He had the brace adjusted in December/18 and apparently did not fit properly and he developed wounds predominantly on the posterior right calf where the  brace rubbed his leg. The patient describes this as acute he noticed it right away and was able to have the brace readjusted. He subsequently psoas had some wounds on the anterior right leg which she says are tape abrasion injuries. His primary doctor is Dr. Rex Kras at [facility]. It looks as though Dr. Rex Kras saw this initially in January. Started him on cephalexin. Patient was seen again on 03/19/17 without much improvement. He has been using Neosporin to the wound areas. He was started on Augmentin on 03/19/18. The patient does not have a wound history. He has a history of a fractured left patella in 2017. Chronic foot drop on the right apparently related to back surgery. Hyperlipidemia and essential hypertension. 03/24/18; the patient still has a scattering of wounds on the anterior medial extending posteriorly in the right leg. The advertising as this is being secondary to abrasion injury although there is a lot of irritation here and one would have to wonder whether this represents chronic venous inflammation/stasis dermatitis. In any case of that's true it was not well recognized. He does have chronic venous changes and secondary  lymphedema. He is completing Augmentin at this point I don't think he needs additional antibiotics. we applied silver alginate under 4-layer compression which the patient tolerated well 04/07/18; the patient's major wound areas on the medial right leg. This is in the setting of chronic venous insufficiency/stasis dermatitis and lymphedema. We applied silver alginate under for layer compression. He's been scratching at this through the stop under the stockings. He has superficial wounds just below the anterior stocking line. He also fell and the [employer] parking lot superficial "road burn" on the left knee and left elbow. We did not the define these areas 04/14/18; since wounds on the right leg look better. He still requires debridement especially on the lower medial calf area we've been using silver alginate under compressionat 4 layer. One of the excoriations from the fall last week had a greenish drainage per her intake nurse. This is on the left knee. Objective Constitutional Sitting or standing Blood Pressure is within target range for patient.. Pulse regular and within target range for patient.Marland Kitchen Respirations regular, non-labored and within target range.. Temperature is normal and within the target range for the patient.Marland Kitchen appears in no distress. Vitals Time Taken: 9:15 AM, Height: 75 in, Weight: 343 lbs, BMI: 42.9, Temperature: 98.1 F, Pulse: 72 bpm, Respiratory Rate: 18 breaths/min, Blood Pressure: 139/82 mmHg. General Notes: wound exam; the area on the right anterior leg looks just about healed. The cluster medially surrounded by venous insufficiency and lymphedema also looks better. 2 areas still required debridement with a #5 curette to remove eschar and nonviable subcutaneous tissue. the wound bed looks stable here. On the left anterior knee he has a superficial Jans, Eliazar K. ([id number]) excoriated/road rash type injury. There is no evidence of infection here. Integumentary (Hair,  Skin) Wound #1 status is Open. Original cause of wound was Trauma. The wound is located on the Right,Posterior Lower Leg. The wound measures 6.5cm length x 6.5cm width x 0.1cm depth; 33.183cm^2 area and 3.318cm^3 volume. There is Fat Layer (Subcutaneous Tissue) Exposed exposed. There is no tunneling or undermining noted. There is a large amount of serosanguineous drainage noted. The wound margin is flat and intact. There is small (1-33%) red granulation within the wound bed. There is a large (67-100%) amount of necrotic tissue within the wound bed including Eschar and Adherent Slough. The periwound skin appearance exhibited: Excoriation, Hemosiderin Staining.  The periwound skin appearance did not exhibit: Callus, Crepitus, Induration, Rash, Scarring, Dry/Scaly, Maceration, Atrophie Blanche, Cyanosis, Ecchymosis, Mottled, Pallor, Rubor, Erythema. Periwound temperature was noted as No Abnormality. Wound #2 status is Open. Original cause of wound was Trauma. The wound is located on the Right,Anterior Lower Leg. The wound measures 0.5cm length x 1.5cm width x 0.1cm depth; 0.589cm^2 area and 0.059cm^3 volume. There is no tunneling or undermining noted. There is a large amount of serous drainage noted. The wound margin is flat and intact. There is no granulation within the wound bed. There is a large (67-100%) amount of necrotic tissue within the wound bed including Eschar. The periwound skin appearance exhibited: Hemosiderin Staining. The periwound skin appearance did not exhibit: Callus, Crepitus, Excoriation, Induration, Rash, Scarring, Dry/Scaly, Maceration, Atrophie Blanche, Cyanosis, Ecchymosis, Mottled, Pallor, Rubor, Erythema. Periwound temperature was noted as No Abnormality. Wound #3 status is Open. Original cause of wound was Gradually Appeared. The wound is located on the Right,Proximal,Anterior Lower Leg. The wound measures 0.5cm length x 0.5cm width x 0.1cm depth; 0.196cm^2 area  and 0.02cm^3 volume. There is no tunneling or undermining noted. There is a large amount of serosanguineous drainage noted. The wound margin is distinct with the outline attached to the wound base. There is no granulation within the wound bed. There is a large (67-100%) amount of necrotic tissue within the wound bed including Eschar. Periwound temperature was noted as No Abnormality. The periwound has tenderness on palpation. Wound #4 status is Open. Original cause of wound was Trauma. The wound is located on the Left Knee. The wound measures 5cm length x 4.5cm width x 0.1cm depth; 17.671cm^2 area and 1.767cm^3 volume. There is no tunneling or undermining noted. There is a large amount of purulent drainage noted. The wound margin is distinct with the outline attached to the wound base. There is medium (34-66%) red, pink granulation within the wound bed. There is a medium (34-66%) amount of necrotic tissue within the wound bed including Eschar and Adherent Slough. The periwound skin appearance exhibited: Maceration. Periwound temperature was noted as No Abnormality. The periwound has tenderness on palpation. Assessment Active Problems ICD-10 L97.211 - Non-pressure chronic ulcer of right calf limited to breakdown of skin I87.331 - Chronic venous hypertension (idiopathic) with ulcer and inflammation of right lower extremity E11.622 - Type 2 diabetes mellitus with other skin ulcer I89.0 - Lymphedema, not elsewhere classified S81.012D - Laceration without foreign body, left knee, subsequent encounter Procedures Wound #1 Pre-procedure diagnosis of Wound #1 is a Diabetic Wound/Ulcer of the Lower Extremity located on the Right,Posterior Lower Leg .Severity of Tissue Pre Debridement is: Fat layer exposed. There was a Excisional Skin/Subcutaneous Tissue Wrisley, Rony K. ([id number]) Debridement with a total area of 9 sq cm performed by Ricard Dillon, MD. With the following instrument(s): Curette. to  remove Viable and Non-Viable tissue/material Material removed includes Subcutaneous Tissue, and Slough, Biofilm, Fibrin/Exudate, and Slough after achieving pain control using Lidocaine 4% Topical Solution. No specimens were taken. A time out was conducted at 09:38, prior to the start of the procedure. A Minimum amount of bleeding was controlled with Pressure. The procedure was tolerated well with a pain level of 0 throughout and a pain level of 0 following the procedure. Post Debridement Measurements: 6.5cm length x 6.5cm width x 0.2cm depth; 6.637cm^3 volume. Character of Wound/Ulcer Post Debridement is improved. Severity of Tissue Post Debridement is: Fat layer exposed. Post procedure Diagnosis Wound #1: Same as Pre-Procedure Plan Wound Cleansing: Wound #1 Right,Posterior Lower  Leg: Cleanse wound with mild soap and water Wound #2 Right,Anterior Lower Leg: Cleanse wound with mild soap and water Wound #3 Right,Proximal,Anterior Lower Leg: Cleanse wound with mild soap and water Wound #4 Left Knee: Cleanse wound with mild soap and water Anesthetic (add to Medication List): Wound #1 Right,Posterior Lower Leg: Topical Lidocaine 4% cream applied to wound bed prior to debridement (In Clinic Only). Wound #2 Right,Anterior Lower Leg: Topical Lidocaine 4% cream applied to wound bed prior to debridement (In Clinic Only). Wound #3 Right,Proximal,Anterior Lower Leg: Topical Lidocaine 4% cream applied to wound bed prior to debridement (In Clinic Only). Wound #4 Left Knee: Topical Lidocaine 4% cream applied to wound bed prior to debridement (In Clinic Only). Skin Barriers/Peri-Wound Care: Wound #1 Right,Posterior Lower Leg: Triamcinolone Acetonide Ointment (TAC) Wound #2 Right,Anterior Lower Leg: Triamcinolone Acetonide Ointment (TAC) Wound #3 Right,Proximal,Anterior Lower Leg: Triamcinolone Acetonide Ointment (TAC) Primary Wound Dressing: Wound #1 Right,Posterior Lower Leg: Silver  Alginate Wound #2 Right,Anterior Lower Leg: Silver Alginate Wound #3 Right,Proximal,Anterior Lower Leg: Silver Alginate Wound #4 Left Knee: Silver Alginate Secondary Dressing: Wound #1 Right,Posterior Lower Leg: ABD pad Other - Absorptive dressing Wound #2 Right,Anterior Lower Leg: ABD pad Other - Absorptive dressing Wound #3 Right,Proximal,Anterior Lower Leg: ABD pad Mcphatter, Sinan K. ([id number]) Other - Absorptive dressing Wound #4 Left Knee: Boardered Foam Dressing Dressing Change Frequency: Wound #1 Right,Posterior Lower Leg: Change dressing every week Wound #2 Right,Anterior Lower Leg: Change dressing every week Wound #3 Right,Proximal,Anterior Lower Leg: Change dressing every week Wound #4 Left Knee: Change dressing every day. Follow-up Appointments: Wound #1 Right,Posterior Lower Leg: Return Appointment in 1 week. Nurse Visit as needed Wound #2 Right,Anterior Lower Leg: Return Appointment in 1 week. Nurse Visit as needed Wound #3 Right,Proximal,Anterior Lower Leg: Return Appointment in 1 week. Nurse Visit as needed Wound #4 Left Knee: Return Appointment in 1 week. Nurse Visit as needed Edema Control: Wound #1 Right,Posterior Lower Leg: 4-Layer Compression System - Right Lower Extremity Elevate legs to the level of the heart and pump ankles as often as possible Wound #2 Right,Anterior Lower Leg: 4-Layer Compression System - Right Lower Extremity Elevate legs to the level of the heart and pump ankles as often as possible Wound #3 Right,Proximal,Anterior Lower Leg: 4-Layer Compression System - Right Lower Extremity Elevate legs to the level of the heart and pump ankles as often as possible Wound #4 Left Knee: Patient to wear own compression stockings Elevate legs to the level of the heart and pump ankles as often as possible Additional Orders / Instructions: Wound #1 Right,Posterior Lower Leg: Activity as tolerated Wound #2 Right,Anterior Lower  Leg: Activity as tolerated Wound #3 Right,Proximal,Anterior Lower Leg: Activity as tolerated Wound #4 Left Knee: Activity as tolerated #1 I think we can continue with silver alginate to the areas on the right leg. The right anterior leg wound is just about healed and the collection of wounds he has on the right medial calf are a lot better and her about healed as well. #2 we are applying TCA to the surrounding venous inflammation especially on the right medial leg. #3 we put the right leg and compression. The left knee will require silver alginate and a covering wound dressing which can be washed daily and changed #4 will need to get him compression stockings for both legs as this appears close to healing KENNEDY, BOHANON ([id number]) Electronic Signature(s) Signed: 04/14/2018 10:18:53 AM By: Linton Ham MD Entered By: Linton Ham on 04/14/2018 10:18:53 Hollinsworth, Ivaan K. (  [id number]) -------------------------------------------------------------------------------- SuperBill Details Patient Name: EZIO, WIECK Date of Service: 04/14/2018 Medical Record Number: [id number] Patient Account Number: [id number] Date of Birth/Sex: June 24, 1961 (57 y.o. 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) with ulcer and inflammation of right lower extremity E11.622 Type 2 diabetes mellitus with other skin ulcer I89.0 Lymphedema, not elsewhere classified S81.012D Laceration without foreign body, left knee, subsequent encounter Facility Procedures CPT4 Code: [id number] Description: 74081 - DEB SUBQ TISSUE 20 SQ CM/< ICD-10 Diagnosis Description L97.211 Non-pressure chronic ulcer of right calf  limited to breakdow Modifier: n of skin Quantity: 1 Physician Procedures CPT4 Code: [id number] Description: 31497 - WC PHYS SUBQ TISS 20 SQ CM ICD-10 Diagnosis Description L97.211 Non-pressure chronic ulcer of right calf limited to breakdow Modifier: n of skin Quantity: 1 Electronic Signature(s) Signed: 04/14/2018 4:28:35 PM By: Linton Ham MD Entered By: Linton Ham on 04/14/2018 10:19:22

## 2018-04-17 NOTE — Progress Notes (Signed)
FUMIO, VANDAM ([id number]) Visit Report for 04/14/2018 Arrival Information Details Patient Name: [name], [name] Date of Service: 04/14/2018 9:00 AM Medical Record Number: [id number] Patient Account Number: [id number] Date of Birth/Sex: 1961-11-03 (57 y.o. M) Treating RN: Ahmed Prima Primary Care Robinson Brinkley: [name] Other Clinician: Referring Gurneet Matarese: [name] Treating Paublo Warshawsky/Extender: Tito Dine in Treatment: 3 Visit Information History Since Last Visit All ordered tests and consults were completed: No Patient Arrived: Cane Added or deleted any medications: No Arrival Time: 09:13 Any new allergies or adverse reactions: No Accompanied By: son Had a fall or experienced change in No Transfer Assistance: EasyPivot Patient activities of daily living that may affect Lift risk of falls: Patient Identification Verified: Yes Signs or symptoms of abuse/neglect since last visito No Secondary Verification Process Yes Hospitalized since last visit: No Completed: Implantable device outside of the clinic excluding No Patient Requires Transmission-Based No cellular tissue based products placed in the center Precautions: since last visit: Patient Has Alerts: Yes Has Dressing in Place as Prescribed: Yes Patient Alerts: DMII Has Compression in Place as Prescribed: Yes Pain Present Now: No Electronic Signature(s) Signed: 04/14/2018 4:27:21 PM By: Alric Quan Entered By: Alric Quan on 04/14/2018 09:15:03 [name] ([id number]) -------------------------------------------------------------------------------- Encounter Discharge Information Details Patient Name: [name] Date of Service: 04/14/2018 9:00 AM Medical Record Number: [id number] Patient Account Number: [id number] Date of Birth/Sex: October 18, 1961 (57 y.o. M) Treating RN: Montey Hora Primary Care Kaylin Marcon: [name] Other Clinician: Referring Kalyssa Anker: [name] Treating  Briannie Gutierrez/Extender: Tito Dine in Treatment: 3 Encounter Discharge Information Items Discharge Pain Level: 0 Discharge Condition: Stable Ambulatory Status: Cane Discharge Destination: Home Transportation: Private Auto Accompanied By: son Schedule Follow-up Appointment: Yes Medication Reconciliation completed and No provided to Patient/Care Azavier Creson: Provided on Clinical Summary of Care: 04/14/2018 Form Type Recipient Paper Patient BS Electronic Signature(s) Signed: 04/14/2018 4:21:17 PM By: Roger Shelter Entered By: Roger Shelter on 04/14/2018 10:13:48 [name] ([id number]) -------------------------------------------------------------------------------- Lower Extremity Assessment Details Patient Name: [name] Date of Service: 04/14/2018 9:00 AM Medical Record Number: [id number] Patient Account Number: [id number] Date of Birth/Sex: 03/08/1961 (57 y.o. M) Treating RN: Ahmed Prima Primary Care Nailani Full: [name] Other Clinician: Referring Zephan Beauchaine: [name] Treating Angeletta Goelz/Extender: Tito Dine in Treatment: 3 Edema Assessment Assessed: [Left: No] [Right: No] [Left: Edema] [Right: :] Calf Left: Right: Point of Measurement: 36 cm From Medial Instep cm 48 cm Ankle Left: Right: Point of Measurement: 14 cm From Medial Instep cm 29 cm Vascular Assessment Pulses: Dorsalis Pedis Palpable: [Right:Yes] Posterior Tibial Extremity colors, hair growth, and conditions: Extremity Color: [Right:Hyperpigmented] Temperature of Extremity: [Right:Warm] Capillary Refill: [Right:< 3 seconds] Toe Nail Assessment Left: Right: Thick: Yes Discolored: Yes Deformed: No Improper Length and Hygiene: No Electronic Signature(s) Signed: 04/14/2018 4:27:21 PM By: Alric Quan Entered By: Alric Quan on 04/14/2018 09:33:27 [name]  ([id number]) -------------------------------------------------------------------------------- Multi Wound Chart Details Patient Name: [name] Date of Service: 04/14/2018 9:00 AM Medical Record Number: [id number] Patient Account Number: [id number] Date of Birth/Sex: 1961/04/21 (57 y.o. M) Treating RN: Montey Hora Primary Care Genevia Bouldin: [name] Other Clinician: Referring Kumiko Fishman: [name] Treating Niambi Smoak/Extender: Tito Dine in Treatment: 3 Vital Signs Height(in): 75 Pulse(bpm): 72 Weight(lbs): 343 Blood Pressure(mmHg): 139/82 Body Mass Index(BMI): 43 Temperature(F): 98.1 Respiratory Rate 18 (breaths/min): Photos: [1:No Photos] [2:No Photos] [3:No Photos] Wound Location: [1:Right Lower Leg - Posterior] [2:Right Lower Leg - Anterior] [3:Right Lower Leg - Anterior, Proximal] Wounding Event: [1:Trauma] [2:Trauma] [  3:Gradually Appeared] Primary Etiology: [1:Diabetic Wound/Ulcer of the Lower Extremity] [2:Diabetic Wound/Ulcer of the Lower Extremity] [3:Diabetic Wound/Ulcer of the Lower Extremity] Secondary Etiology: [1:Venous Leg Ulcer] [2:Venous Leg Ulcer] [3:N/A] Comorbid History: [1:Hypertension, Type II Diabetes, Neuropathy] [2:Hypertension, Type II Diabetes, Neuropathy] [3:Hypertension, Type II Diabetes, Neuropathy] Date Acquired: [1:11/23/2017] [2:11/23/2017] [3:04/14/2018] Weeks of Treatment: [1:3] [2:3] [3:0] Wound Status: [1:Open] [2:Open] [3:Open] Measurements L x W x D [1:6.5x6.5x0.1] [2:0.5x1.5x0.1] [3:0.5x0.5x0.1] (cm) Area (cm) : [1:33.183] [2:0.589] [3:0.196] Volume (cm) : [1:3.318] [2:0.059] [3:0.02] % Reduction in Area: [1:-37.50%] [2:85.80%] [3:N/A] % Reduction in Volume: [1:-37.50%] [2:85.80%] [3:N/A] Classification: [1:Grade 1] [2:Grade 1] [3:Grade 1] Exudate Amount: [1:Large] [2:Large] [3:Large] Exudate Type: [1:Serosanguineous] [2:Serous] [3:Serosanguineous] Exudate Color: [1:red, brown] [2:amber] [3:red, brown] Wound  Margin: [1:Flat and Intact] [2:Flat and Intact] [3:Distinct, outline attached] Granulation Amount: [1:Small (1-33%)] [2:None Present (0%)] [3:None Present (0%)] Granulation Quality: [1:Red] [2:N/A] [3:N/A] Necrotic Amount: [1:Large (67-100%)] [2:Large (67-100%)] [3:Large (67-100%)] Necrotic Tissue: [1:Eschar, Adherent Slough] [2:Eschar] [3:Eschar] Exposed Structures: [1:Fat Layer (Subcutaneous Tissue) Exposed: Yes Fascia: No Tendon: No Muscle: No Joint: No Bone: No] [2:Fascia: No Fat Layer (Subcutaneous Tissue) Exposed: No Tendon: No Muscle: No Joint: No Bone: No] [3:Fascia: No Fat Layer (Subcutaneous Tissue)  Exposed: No Tendon: No Muscle: No Joint: No Bone: No] Epithelialization: [1:Small (1-33%)] [2:Small (1-33%)] [3:None] Debridement: [1:Debridement - Excisional 09:38] [2:N/A N/A] [3:N/A N/A] Pre-procedure Verification/Time Out Taken: Pain Control: Lidocaine 4% Topical Solution N/A N/A Tissue Debrided: Subcutaneous, Slough N/A N/A Level: Skin/Subcutaneous Tissue N/A N/A Debridement Area (sq cm): 42.25 N/A N/A Instrument: Curette N/A N/A Bleeding: Minimum N/A N/A Hemostasis Achieved: Pressure N/A N/A Procedural Pain: 0 N/A N/A Post Procedural Pain: 0 N/A N/A Debridement Treatment Procedure was tolerated well N/A N/A Response: Post Debridement 6.5x6.5x0.2 N/A N/A Measurements L x W x D (cm) Post Debridement Volume: 6.637 N/A N/A (cm) Periwound Skin Texture: Excoriation: Yes Excoriation: No No Abnormalities Noted Induration: No Induration: No Callus: No Callus: No Crepitus: No Crepitus: No Rash: No Rash: No Scarring: No Scarring: No Periwound Skin Moisture: Maceration: No Maceration: No No Abnormalities Noted Dry/Scaly: No Dry/Scaly: No Periwound Skin Color: Hemosiderin Staining: Yes Hemosiderin Staining: Yes No Abnormalities Noted Atrophie Blanche: No Atrophie Blanche: No Cyanosis: No Cyanosis: No Ecchymosis: No Ecchymosis: No Erythema: No Erythema:  No Mottled: No Mottled: No Pallor: No Pallor: No Rubor: No Rubor: No Temperature: No Abnormality No Abnormality No Abnormality Tenderness on Palpation: No No Yes Wound Preparation: Ulcer Cleansing: Ulcer Cleansing: Ulcer Cleansing: Rinsed/Irrigated with Saline, Rinsed/Irrigated with Saline, Rinsed/Irrigated with Saline, Other: soap and water Other: soap and water Other: soap and water Topical Anesthetic Applied: Topical Anesthetic Applied: Topical Anesthetic Applied: Other: lidocaine 4% Other: lidocaine 4% Other: lidocaine 4% Procedures Performed: Debridement N/A N/A Wound Number: 4 N/A N/A Photos: No Photos N/A N/A Wound Location: Left Knee N/A N/A Wounding Event: Trauma N/A N/A Primary Etiology: Trauma, Other N/A N/A Secondary Etiology: N/A N/A N/A Comorbid History: Hypertension, Type II N/A N/A Diabetes, Neuropathy Date Acquired: 04/07/2018 N/A N/A Weeks of Treatment: 0 N/A N/A Wound Status: Open N/A N/A Measurements L x W x D 5x4.5x0.1 N/A N/A (cm) Gruenwald, Lonza K. ([id number]) Area (cm) : 17.671 N/A N/A Volume (cm) : 1.767 N/A N/A % Reduction in Area: N/A N/A N/A % Reduction in Volume: N/A N/A N/A Classification: Full Thickness Without N/A N/A Exposed Support Structures Exudate Amount: Large N/A N/A Exudate Type: Purulent N/A N/A Exudate Color: yellow, brown, green N/A N/A Wound Margin: Distinct, outline attached N/A N/A Granulation Amount: Medium (34-66%)  N/A N/A Granulation Quality: Red, Pink N/A N/A Necrotic Amount: Medium (34-66%) N/A N/A Necrotic Tissue: Eschar, Adherent Slough N/A N/A Exposed Structures: Fascia: No N/A N/A Fat Layer (Subcutaneous Tissue) Exposed: No Tendon: No Muscle: No Joint: No Bone: No Epithelialization: None N/A N/A Debridement: N/A N/A N/A Pain Control: N/A N/A N/A Tissue Debrided: N/A N/A N/A Level: N/A N/A N/A Debridement Area (sq cm): N/A N/A N/A Instrument: N/A N/A N/A Bleeding: N/A N/A N/A Hemostasis Achieved:  N/A N/A N/A Procedural Pain: N/A N/A N/A Post Procedural Pain: N/A N/A N/A Debridement Treatment N/A N/A N/A Response: Post Debridement N/A N/A N/A Measurements L x W x D (cm) Post Debridement Volume: N/A N/A N/A (cm) Periwound Skin Texture: No Abnormalities Noted N/A N/A Periwound Skin Moisture: Maceration: Yes N/A N/A Periwound Skin Color: No Abnormalities Noted N/A N/A Temperature: No Abnormality N/A N/A Tenderness on Palpation: Yes N/A N/A Wound Preparation: Ulcer Cleansing: N/A N/A Rinsed/Irrigated with Saline Topical Anesthetic Applied: Other: lidocaine 4% Procedures Performed: N/A N/A N/A Treatment Notes Electronic Signature(s) Signed: 04/14/2018 4:28:35 PM By: Linton Ham MD GRAISON, LEINBERGER ([id number]) Entered By: Linton Ham on 04/14/2018 10:11:34 [name] ([id number]) -------------------------------------------------------------------------------- Multi-Disciplinary Care Plan Details Patient Name: [name] Date of Service: 04/14/2018 9:00 AM Medical Record Number: [id number] Patient Account Number: [id number] Date of Birth/Sex: 02/22/1961 (57 y.o. M) Treating RN: Montey Hora Primary Care Hughes Wyndham: [name] Other Clinician: Referring Briston Lax: [name] Treating Amiaya Mcneeley/Extender: Tito Dine in Treatment: 3 Active Inactive ` Orientation to the Wound Care Program Nursing Diagnoses: Knowledge deficit related to the wound healing center program Goals: Patient/caregiver will verbalize understanding of the [place] [place] Program Date Initiated: 03/24/2018 Target Resolution Date: 04/23/2018 Goal Status: Active Interventions: Provide education on orientation to the wound center Notes: ` Soft Tissue Infection Nursing Diagnoses: Potential for infection: soft tissue Goals: Patient will remain free of wound infection Date Initiated: 03/24/2018 Target Resolution Date: 04/23/2018 Goal Status:  Active Interventions: Assess signs and symptoms of infection every visit Treatment Activities: Systemic antibiotics : 03/24/2018 Notes: ` Wound/Skin Impairment Nursing Diagnoses: Impaired tissue integrity Goals: Ulcer/skin breakdown will have a volume reduction of 80% by week 12 Date Initiated: 03/24/2018 Target Resolution Date: 07/24/2018 ATANACIO, MELNYK ([id number]) Goal Status: Active Ulcer/skin breakdown will heal within 14 weeks Date Initiated: 03/24/2018 Target Resolution Date: 07/24/2018 Goal Status: Active Interventions: Assess ulceration(s) every visit Treatment Activities: Referred to DME Otila Starn for dressing supplies : 03/24/2018 Topical wound management initiated : 03/24/2018 Notes: Electronic Signature(s) Signed: 04/14/2018 4:48:19 PM By: Montey Hora Entered By: Montey Hora on 04/14/2018 09:40:16 [name] ([id number]) -------------------------------------------------------------------------------- Pain Assessment Details Patient Name: [name] Date of Service: 04/14/2018 9:00 AM Medical Record Number: [id number] Patient Account Number: [id number] Date of Birth/Sex: Oct 06, 1961 (57 y.o. M) Treating RN: Ahmed Prima Primary Care Arieon Corcoran: [name] Other Clinician: Referring Madisin Hasan: [name] Treating Marjorie Lussier/Extender: Tito Dine in Treatment: 3 Active Problems Location of Pain Severity and Description of Pain Patient Has Paino No Site Locations Pain Management and Medication Current Pain Management: Electronic Signature(s) Signed: 04/14/2018 4:27:21 PM By: Alric Quan Entered By: Alric Quan on 04/14/2018 09:15:12 [name] ([id number]) -------------------------------------------------------------------------------- Patient/Caregiver Education Details Patient Name: [name] Date of Service: 04/14/2018 9:00 AM Medical Record Number: [id number] Patient Account Number: [id number] Date of Birth/Gender:  June 09, 1961 (57 y.o. M) Treating RN: Roger Shelter Primary Care Physician: [name] Other Clinician: Referring Physician: [name] Treating Physician/Extender: Tito Dine in Treatment: 3  Education Assessment Education Provided To: Patient Education Topics Provided Wound Debridement: Handouts: Wound Debridement Methods: Explain/Verbal Responses: State content correctly Wound/Skin Impairment: Handouts: Caring for Your Ulcer Methods: Explain/Verbal Responses: State content correctly Electronic Signature(s) Signed: 04/14/2018 4:21:17 PM By: Roger Shelter Entered By: Roger Shelter on 04/14/2018 10:14:05 [name] ([id number]) -------------------------------------------------------------------------------- Wound Assessment Details Patient Name: [name] Date of Service: 04/14/2018 9:00 AM Medical Record Number: [id number] Patient Account Number: [id number] Date of Birth/Sex: March 30, 1961 (57 y.o. M) Treating RN: Ahmed Prima Primary Care Kaimen Peine: [name] Other Clinician: Referring Kaenan Jake: [name] Treating Shelia Kingsberry/Extender: Tito Dine in Treatment: 3 Wound Status Wound Number: 1 Primary Etiology: Diabetic Wound/Ulcer of the Lower Extremity Wound Location: Right Lower Leg - Posterior Secondary Venous Leg Ulcer Wounding Event: Trauma Etiology: Date Acquired: 11/23/2017 Wound Status: Open Weeks Of Treatment: 3 Comorbid History: Hypertension, Type II Diabetes, Clustered Wound: No Neuropathy Photos Photo Uploaded By: Alric Quan on 04/14/2018 16:24:06 Wound Measurements Length: (cm) 6.5 Width: (cm) 6.5 Depth: (cm) 0.1 Area: (cm) 33.183 Volume: (cm) 3.318 % Reduction in Area: -37.5% % Reduction in Volume: -37.5% Epithelialization: Small (1-33%) Tunneling: No Undermining: No Wound Description Classification: Grade 1 Wound Margin: Flat and Intact Exudate Amount: Large Exudate Type:  Serosanguineous Exudate Color: red, brown Foul Odor After Cleansing: No Slough/Fibrino Yes Wound Bed Granulation Amount: Small (1-33%) Exposed Structure Granulation Quality: Red Fascia Exposed: No Necrotic Amount: Large (67-100%) Fat Layer (Subcutaneous Tissue) Exposed: Yes Necrotic Quality: Eschar, Adherent Slough Tendon Exposed: No Muscle Exposed: No Joint Exposed: No Bone Exposed: No Periwound Skin Texture Texture Color No Abnormalities Noted: No No Abnormalities Noted: No Dalton, Shawntez K. ([id number]) Callus: No Atrophie Blanche: No Crepitus: No Cyanosis: No Excoriation: Yes Ecchymosis: No Induration: No Erythema: No Rash: No Hemosiderin Staining: Yes Scarring: No Mottled: No Pallor: No Moisture Rubor: No No Abnormalities Noted: No Dry / Scaly: No Temperature / Pain Maceration: No Temperature: No Abnormality Wound Preparation Ulcer Cleansing: Rinsed/Irrigated with Saline, Other: soap and water, Topical Anesthetic Applied: Other: lidocaine 4%, Treatment Notes Wound #1 (Right, Posterior Lower Leg) 1. Cleansed with: Clean wound with Normal Saline 2. Anesthetic Topical Lidocaine 4% cream to wound bed prior to debridement 4. Dressing Applied: Other dressing (specify in notes) 5. Secondary Dressing Applied ABD Pad 7. Secured with 4-Layer Compression System - Right Lower Extremity Notes silvercell Electronic Signature(s) Signed: 04/14/2018 4:27:21 PM By: Alric Quan Entered By: Alric Quan on 04/14/2018 09:26:54 [name] ([id number]) -------------------------------------------------------------------------------- Wound Assessment Details Patient Name: [name] Date of Service: 04/14/2018 9:00 AM Medical Record Number: [id number] Patient Account Number: [id number] Date of Birth/Sex: Jul 24, 1961 (57 y.o. M) Treating RN: Ahmed Prima Primary Care Shuntavia Yerby: [name] Other Clinician: Referring Sargun Rummell: [name] Treating  Kelseigh Diver/Extender: Tito Dine in Treatment: 3 Wound Status Wound Number: 2 Primary Etiology: Diabetic Wound/Ulcer of the Lower Extremity Wound Location: Right Lower Leg - Anterior Secondary Venous Leg Ulcer Wounding Event: Trauma Etiology: Date Acquired: 11/23/2017 Wound Status: Open Weeks Of Treatment: 3 Comorbid History: Hypertension, Type II Diabetes, Clustered Wound: No Neuropathy Photos Photo Uploaded By: Alric Quan on 04/14/2018 16:24:06 Wound Measurements Length: (cm) 0.5 Width: (cm) 1.5 Depth: (cm) 0.1 Area: (cm) 0.589 Volume: (cm) 0.059 % Reduction in Area: 85.8% % Reduction in Volume: 85.8% Epithelialization: Small (1-33%) Tunneling: No Undermining: No Wound Description Classification: Grade 1 Wound Margin: Flat and Intact Exudate Amount: Large Exudate Type: Serous Exudate Color: amber Foul Odor After Cleansing: No Slough/Fibrino Yes Wound Bed Granulation Amount: None Present (  0%) Exposed Structure Necrotic Amount: Large (67-100%) Fascia Exposed: No Necrotic Quality: Eschar Fat Layer (Subcutaneous Tissue) Exposed: No Tendon Exposed: No Muscle Exposed: No Joint Exposed: No Bone Exposed: No Periwound Skin Texture Texture Color No Abnormalities Noted: No No Abnormalities Noted: No EILAM, [place]. ([id number]) Callus: No Atrophie Blanche: No Crepitus: No Cyanosis: No Excoriation: No Ecchymosis: No Induration: No Erythema: No Rash: No Hemosiderin Staining: Yes Scarring: No Mottled: No Pallor: No Moisture Rubor: No No Abnormalities Noted: No Dry / Scaly: No Temperature / Pain Maceration: No Temperature: No Abnormality Wound Preparation Ulcer Cleansing: Rinsed/Irrigated with Saline, Other: soap and water, Topical Anesthetic Applied: Other: lidocaine 4%, Treatment Notes Wound #2 (Right, Anterior Lower Leg) 1. Cleansed with: Clean wound with Normal Saline 2. Anesthetic Topical Lidocaine 4% cream to wound bed prior  to debridement 4. Dressing Applied: Other dressing (specify in notes) 5. Secondary Dressing Applied ABD Pad 7. Secured with 4-Layer Compression System - Right Lower Extremity Notes silvercell Electronic Signature(s) Signed: 04/14/2018 4:27:21 PM By: Alric Quan Entered By: Alric Quan on 04/14/2018 09:25:47 [name] ([id number]) -------------------------------------------------------------------------------- Wound Assessment Details Patient Name: [name] Date of Service: 04/14/2018 9:00 AM Medical Record Number: [id number] Patient Account Number: [id number] Date of Birth/Sex: 1961-10-19 (57 y.o. M) Treating RN: Ahmed Prima Primary Care Datha Kissinger: [name] Other Clinician: Referring Bailee Thall: [name] Treating Jem Castro/Extender: Tito Dine in Treatment: 3 Wound Status Wound Number: 3 Primary Etiology: Diabetic Wound/Ulcer of the Lower Extremity Wound Location: Right Lower Leg - Anterior, Proximal Wound Status: Open Wounding Event: Gradually Appeared Comorbid Hypertension, Type II Diabetes, Date Acquired: 04/14/2018 History: Neuropathy Weeks Of Treatment: 0 Clustered Wound: No Photos Photo Uploaded By: Alric Quan on 04/14/2018 16:25:05 Wound Measurements Length: (cm) 0.5 Width: (cm) 0.5 Depth: (cm) 0.1 Area: (cm) 0.196 Volume: (cm) 0.02 % Reduction in Area: % Reduction in Volume: Epithelialization: None Tunneling: No Undermining: No Wound Description Classification: Grade 1 Wound Margin: Distinct, outline attached Exudate Amount: Large Exudate Type: Serosanguineous Exudate Color: red, brown Foul Odor After Cleansing: No Slough/Fibrino Yes Wound Bed Granulation Amount: None Present (0%) Exposed Structure Necrotic Amount: Large (67-100%) Fascia Exposed: No Necrotic Quality: Eschar Fat Layer (Subcutaneous Tissue) Exposed: No Tendon Exposed: No Muscle Exposed: No Joint Exposed: No Bone Exposed:  No Periwound Skin Texture Texture Color No Abnormalities Noted: No No Abnormalities Noted: No Moisture Temperature / Pain Reifschneider, Rajvir K. ([id number]) No Abnormalities Noted: No Temperature: No Abnormality Tenderness on Palpation: Yes Wound Preparation Ulcer Cleansing: Rinsed/Irrigated with Saline, Other: soap and water, Topical Anesthetic Applied: Other: lidocaine 4%, Treatment Notes Wound #3 (Right, Proximal, Anterior Lower Leg) 1. Cleansed with: Clean wound with Normal Saline 2. Anesthetic Topical Lidocaine 4% cream to wound bed prior to debridement 4. Dressing Applied: Other dressing (specify in notes) 5. Secondary Dressing Applied ABD Pad 7. Secured with 4-Layer Compression System - Right Lower Extremity Notes silvercell Electronic Signature(s) Signed: 04/14/2018 4:27:21 PM By: Alric Quan Entered By: Alric Quan on 04/14/2018 09:29:36 [name] ([id number]) -------------------------------------------------------------------------------- Wound Assessment Details Patient Name: [name] Date of Service: 04/14/2018 9:00 AM Medical Record Number: [id number] Patient Account Number: [id number] Date of Birth/Sex: 1961-11-02 (57 y.o. M) Treating RN: Ahmed Prima Primary Care Maxey Ransom: [name] Other Clinician: Referring Quinterrius Errington: [name] Treating Aeden Matranga/Extender: Tito Dine in Treatment: 3 Wound Status Wound Number: 4 Primary Etiology: Trauma, Other Wound Location: Left Knee Wound Status: Open Wounding Event: Trauma Comorbid Hypertension, Type II Diabetes, History: Neuropathy Date Acquired: 04/07/2018 Weeks Of  Treatment: 0 Clustered Wound: No Photos Photo Uploaded By: Alric Quan on 04/14/2018 16:25:05 Wound Measurements Length: (cm) 5 Width: (cm) 4.5 Depth: (cm) 0.1 Area: (cm) 17.671 Volume: (cm) 1.767 % Reduction in Area: % Reduction in Volume: Epithelialization: None Tunneling: No Undermining:  No Wound Description Full Thickness Without Exposed Support Foul Classification: Structures Slou Wound Margin: Distinct, outline attached Exudate Large Amount: Exudate Type: Purulent Exudate Color: yellow, brown, green Odor After Cleansing: No gh/Fibrino Yes Wound Bed Granulation Amount: Medium (34-66%) Exposed Structure Granulation Quality: Red, Pink Fascia Exposed: No Necrotic Amount: Medium (34-66%) Fat Layer (Subcutaneous Tissue) Exposed: No Necrotic Quality: Eschar, Adherent Slough Tendon Exposed: No Muscle Exposed: No Joint Exposed: No Bone Exposed: No Periwound Skin Texture Texture Color Pressly, Chozen K. ([id number]) No Abnormalities Noted: No No Abnormalities Noted: No Moisture Temperature / Pain No Abnormalities Noted: No Temperature: No Abnormality Maceration: Yes Tenderness on Palpation: Yes Wound Preparation Ulcer Cleansing: Rinsed/Irrigated with Saline Topical Anesthetic Applied: Other: lidocaine 4%, Treatment Notes Wound #4 (Left Knee) 1. Cleansed with: Clean wound with Normal Saline 2. Anesthetic Topical Lidocaine 4% cream to wound bed prior to debridement 4. Dressing Applied: Other dressing (specify in notes) 5. Secondary Dressing Applied Non-Adherent pad Notes silvercell Electronic Signature(s) Signed: 04/14/2018 4:27:21 PM By: Alric Quan Entered By: Alric Quan on 04/14/2018 09:32:02 [name] ([id number]) -------------------------------------------------------------------------------- Vitals Details Patient Name: [name] Date of Service: 04/14/2018 9:00 AM Medical Record Number: [id number] Patient Account Number: [id number] Date of Birth/Sex: November 11, 1961 (57 y.o. M) Treating RN: Ahmed Prima Primary Care Amaira Safley: [name] Other Clinician: Referring Mistie Adney: [name] Treating Jeiden Daughtridge/Extender: Tito Dine in Treatment: 3 Vital Signs Time Taken: 09:15 Temperature (F): 98.1 Height (in):  75 Pulse (bpm): 72 Weight (lbs): 343 Respiratory Rate (breaths/min): 18 Body Mass Index (BMI): 42.9 Blood Pressure (mmHg): 139/82 Reference Range: 80 - 120 mg / dl Electronic Signature(s) Signed: 04/14/2018 4:27:21 PM By: Alric Quan Entered By: Alric Quan on 04/14/2018 09:18:09

## 2018-04-20 ENCOUNTER — Other Ambulatory Visit (INDEPENDENT_AMBULATORY_CARE_PROVIDER_SITE_OTHER): Payer: Self-pay | Admitting: Family Medicine

## 2018-04-20 ENCOUNTER — Ambulatory Visit (INDEPENDENT_AMBULATORY_CARE_PROVIDER_SITE_OTHER): Payer: Medicare HMO

## 2018-04-20 DIAGNOSIS — I872 Venous insufficiency (chronic) (peripheral): Secondary | ICD-10-CM

## 2018-04-21 ENCOUNTER — Encounter: Payer: Medicare HMO | Admitting: Internal Medicine

## 2018-04-21 DIAGNOSIS — S81012A Laceration without foreign body, left knee, initial encounter: Secondary | ICD-10-CM | POA: Diagnosis not present

## 2018-04-21 DIAGNOSIS — E11622 Type 2 diabetes mellitus with other skin ulcer: Secondary | ICD-10-CM | POA: Diagnosis not present

## 2018-04-21 DIAGNOSIS — I890 Lymphedema, not elsewhere classified: Secondary | ICD-10-CM | POA: Diagnosis not present

## 2018-04-21 DIAGNOSIS — L97211 Non-pressure chronic ulcer of right calf limited to breakdown of skin: Secondary | ICD-10-CM | POA: Diagnosis not present

## 2018-04-21 DIAGNOSIS — I87331 Chronic venous hypertension (idiopathic) with ulcer and inflammation of right lower extremity: Secondary | ICD-10-CM | POA: Diagnosis not present

## 2018-04-21 DIAGNOSIS — W19XXXA Unspecified fall, initial encounter: Secondary | ICD-10-CM | POA: Diagnosis not present

## 2018-04-21 DIAGNOSIS — L97819 Non-pressure chronic ulcer of other part of right lower leg with unspecified severity: Secondary | ICD-10-CM | POA: Diagnosis not present

## 2018-04-21 DIAGNOSIS — S81002A Unspecified open wound, left knee, initial encounter: Secondary | ICD-10-CM | POA: Diagnosis not present

## 2018-04-23 ENCOUNTER — Ambulatory Visit: Payer: Medicare HMO | Admitting: Podiatry

## 2018-04-23 ENCOUNTER — Encounter: Payer: Self-pay | Admitting: Podiatry

## 2018-04-23 VITALS — BP 161/101 | HR 72

## 2018-04-23 DIAGNOSIS — E1159 Type 2 diabetes mellitus with other circulatory complications: Secondary | ICD-10-CM

## 2018-04-23 DIAGNOSIS — B351 Tinea unguium: Secondary | ICD-10-CM

## 2018-04-23 DIAGNOSIS — M79675 Pain in left toe(s): Secondary | ICD-10-CM

## 2018-04-23 DIAGNOSIS — M21371 Foot drop, right foot: Secondary | ICD-10-CM | POA: Diagnosis not present

## 2018-04-23 DIAGNOSIS — M79674 Pain in right toe(s): Secondary | ICD-10-CM

## 2018-04-23 NOTE — Progress Notes (Signed)
His patient presents to the office for an evaluation of his diabetic feet.  He was referred to this office by [facility].  Marland Kitchen He presents to the office and I watched him walk towards the room.  He walked very slowly with an antalgic gait.  He was wearing a brace on his right foot and leg.  Marland Kitchen He has a history of diabetes for which she is taking medicine by mouth.   He gives a history of having back surgery which has led to the right foot drop.  He says he was initially wearing an AFO brace in his shoe but now he is wearing a brace for his foot and leg.  He says this brace  broke and caused multiple wounds on his right leg.  Marland Kitchen He has been under treatment for the wound Center and says he has only one more dressing change and he will be dismissed.He presents the office for preventative foot care services   In addition to an evaluation of this brace.  General Appearance  Alert, conversant and in no acute stress.  Vascular  Dorsalis pedis and posterior tibial  pulses are not  palpable  bilaterally.  Capillary return is within normal limits  bilaterally. Temperature is within normal limits  bilaterally.  Neurologic  Senn-Weinstein monofilament wire test within normal limits  Left.  LOPS absent right foot.. Muscle power within normal limits left.  Right drop foot.  Nails Thick disfigured discolored nails with subungual debris  from hallux to fifth toes bilaterally. No evidence of bacterial infection or drainage bilaterally.  Orthopedic  No limitations of motion of motion feet .  No crepitus or effusions noted.  No bony pathology or digital deformities noted.  Skin  normotropic skin with no porokeratosis noted bilaterally.  No signs of infections or ulcers noted. Skin necrosis on distal aspect right hallux which is healing.Marland Kitchen  He says he scraped fis feet walking.    Onychomycosis  B/L  Diabetes with vascular disease.  Foot drop right foot/leg.  IE  Debride nails  X 10.  Investigated his brace and talked with Dawn.   He is not eligible for another brace for another 2 years.  . I suggested he be evaluated by our pedorthist tfor both the AFO and foot/leg brace.  Marland Kitchen He is to call [facility]  in 2 weeks for follow up with the pedorthist.  RTC V6 months per the patient for preventative foot care services.   Gardiner Barefoot DPM

## 2018-04-24 NOTE — Progress Notes (Signed)
[name], [name] ([id number]) Visit Report for 04/21/2018 Arrival Information Details Patient Name: [name] Date of Service: 04/21/2018 11:15 AM Medical Record Number: [id number] Patient Account Number: [id number] Date of Birth/Sex: Oct 30, 1961 (57 y.o. M) Treating RN: Roger Shelter Primary Care Esker Dever: [name] Other Clinician: Referring Dhillon Comunale: [name] Treating Tedra Coppernoll/Extender: Tito Dine in Treatment: 4 Visit Information History Since Last Visit All ordered tests and consults were completed: No Patient Arrived: Cane Added or deleted any medications: No Arrival Time: 11:16 Any new allergies or adverse reactions: No Accompanied By: son Had a fall or experienced change in No Transfer Assistance: None activities of daily living that may affect Patient Identification Verified: Yes risk of falls: Secondary Verification Process Completed: Yes Signs or symptoms of abuse/neglect since last visito No Patient Requires Transmission-Based Precautions: No Hospitalized since last visit: No Patient Has Alerts: Yes Implantable device outside of the clinic excluding No Patient Alerts: DMII cellular tissue based products placed in the center since last visit: Pain Present Now: No Electronic Signature(s) Signed: 04/21/2018 4:46:18 PM By: Roger Shelter Entered By: Roger Shelter on 04/21/2018 11:16:23 [name] ([id number]) -------------------------------------------------------------------------------- Encounter Discharge Information Details Patient Name: [name] Date of Service: 04/21/2018 11:15 AM Medical Record Number: [id number] Patient Account Number: [id number] Date of Birth/Sex: 1961-05-30 (57 y.o. M) Treating RN: Montey Hora Primary Care Alissia Lory: [name] Other Clinician: Referring Sophronia Varney: [name] Treating Camia Dipinto/Extender: Tito Dine in Treatment: 4 Encounter Discharge Information Items Discharge  Condition: Stable Ambulatory Status: Ambulatory Discharge Destination: Home Transportation: Private Auto Accompanied By: son Schedule Follow-up Appointment: Yes Clinical Summary of Care: Electronic Signature(s) Signed: 04/21/2018 1:11:31 PM By: Montey Hora Entered By: Montey Hora on 04/21/2018 13:11:31 [name] ([id number]) -------------------------------------------------------------------------------- Lower Extremity Assessment Details Patient Name: [name] Date of Service: 04/21/2018 11:15 AM Medical Record Number: [id number] Patient Account Number: [id number] Date of Birth/Sex: 1960/12/28 (57 y.o. M) Treating RN: Roger Shelter Primary Care Colden Samaras: [name] Other Clinician: Referring Aailyah Dunbar: [name] Treating Desirie Minteer/Extender: Tito Dine in Treatment: 4 Edema Assessment Assessed: [Left: No] [Right: No] [Left: Edema] [Right: :] Calf Left: Right: Point of Measurement: 36 cm From Medial Instep cm 48.7 cm Ankle Left: Right: Point of Measurement: 14 cm From Medial Instep cm 28 cm Vascular Assessment Claudication: Claudication Assessment [Right:None] Pulses: Dorsalis Pedis Palpable: [Right:Yes] Posterior Tibial Extremity colors, hair growth, and conditions: Extremity Color: [Right:Normal] Hair Growth on Extremity: [Right:Yes] Temperature of Extremity: [Right:Warm] Capillary Refill: [Right:< 3 seconds] Toe Nail Assessment Left: Right: Thick: Yes Discolored: Yes Deformed: Yes Improper Length and Hygiene: Yes Electronic Signature(s) Signed: 04/21/2018 4:46:18 PM By: Roger Shelter Entered By: Roger Shelter on 04/21/2018 11:25:44 [name] ([id number]) -------------------------------------------------------------------------------- Multi Wound Chart Details Patient Name: [name] Date of Service: 04/21/2018 11:15 AM Medical Record Number: [id number] Patient Account Number: [id number] Date of Birth/Sex:  [birth year] (57 y.o. M) Treating RN: Cornell Barman Primary Care Arnitra Sokoloski: [name] Other Clinician: Referring Harris Penton: [name] Treating Tareva Leske/Extender: Tito Dine in Treatment: 4 Vital Signs Height(in): 75 Pulse(bpm): 61 Weight(lbs): 343 Blood Pressure(mmHg): 165/86 Body Mass Index(BMI): 43 Temperature(F): 98.2 Respiratory Rate 18 (breaths/min): Photos: [1:No Photos] [2:No Photos] [3:No Photos] Wound Location: [1:Right Lower Leg - Posterior] [2:Right, Anterior Lower Leg] [3:Right Lower Leg - Anterior, Proximal] Wounding Event: [1:Trauma] [2:Trauma] [3:Gradually Appeared] Primary Etiology: [1:Diabetic Wound/Ulcer of the Lower Extremity] [2:Diabetic Wound/Ulcer of the Lower Extremity] [3:Diabetic Wound/Ulcer of the Lower Extremity] Secondary Etiology: [1:Venous Leg Ulcer] [2:Venous Leg Ulcer] [  3:N/A] Comorbid History: [1:Hypertension, Type II Diabetes, Neuropathy] [2:Hypertension, Type II Diabetes, Neuropathy] [3:Hypertension, Type II Diabetes, Neuropathy] Date Acquired: [1:11/23/2017] [2:11/23/2017] [3:04/14/2018] Weeks of Treatment: [1:4] [2:4] [3:1] Wound Status: [1:Healed - Epithelialized] [2:Healed - Epithelialized] [3:Open] Measurements L x W x D [1:0x0x0] [2:0x0x0] [3:0.5x0.5x0.1] (cm) Area (cm) : [1:0] [2:0] [3:0.196] Volume (cm) : [1:0] [2:0] [3:0.02] % Reduction in Area: [1:100.00%] [2:100.00%] [3:0.00%] % Reduction in Volume: [1:100.00%] [2:100.00%] [3:0.00%] Classification: [1:Grade 1] [2:Grade 1] [3:Grade 1] Exudate Amount: [1:Large] [2:Small] [3:Small] Exudate Type: [1:Serosanguineous] [2:Serosanguineous] [3:Serosanguineous] Exudate Color: [1:red, brown] [2:red, brown] [3:red, brown] Wound Margin: [1:Flat and Intact] [2:Flat and Intact] [3:Distinct, outline attached] Granulation Amount: [1:Small (1-33%)] [2:Large (67-100%)] [3:None Present (0%)] Granulation Quality: [1:Red] [2:Red] [3:N/A] Necrotic Amount: [1:Large (67-100%)] [2:None  Present (0%)] [3:Large (67-100%)] Necrotic Tissue: [1:Eschar, Adherent Slough] [2:N/A] [3:Eschar] Exposed Structures: [1:Fat Layer (Subcutaneous Tissue) Exposed: Yes Fascia: No Tendon: No Muscle: No Joint: No Bone: No] [2:Fascia: No Fat Layer (Subcutaneous Tissue) Exposed: No Tendon: No Muscle: No Joint: No Bone: No] [3:Fascia: No Fat Layer (Subcutaneous Tissue)  Exposed: No Tendon: No Muscle: No Joint: No Bone: No] Epithelialization: [1:Small (1-33%)] [2:Small (1-33%)] [3:None] Debridement: [1:N/A] [2:N/A] [3:N/A] Pain Control: [1:N/A] [2:N/A] [3:N/A] Tissue Debrided: N/A N/A N/A Level: N/A N/A N/A Debridement Area (sq cm): N/A N/A N/A Instrument: N/A N/A N/A Bleeding: N/A N/A N/A Hemostasis Achieved: N/A N/A N/A Procedural Pain: N/A N/A N/A Post Procedural Pain: N/A N/A N/A Debridement Treatment N/A N/A N/A Response: Post Debridement N/A N/A N/A Measurements L x W x D (cm) Post Debridement Volume: N/A N/A N/A (cm) Periwound Skin Texture: Excoriation: Yes Excoriation: No No Abnormalities Noted Induration: No Induration: No Callus: No Callus: No Crepitus: No Crepitus: No Rash: No Rash: No Scarring: No Scarring: No Periwound Skin Moisture: Maceration: No Maceration: No No Abnormalities Noted Dry/Scaly: No Dry/Scaly: No Periwound Skin Color: Hemosiderin Staining: Yes Hemosiderin Staining: Yes No Abnormalities Noted Atrophie Blanche: No Atrophie Blanche: No Cyanosis: No Cyanosis: No Ecchymosis: No Ecchymosis: No Erythema: No Erythema: No Mottled: No Mottled: No Pallor: No Pallor: No Rubor: No Rubor: No Temperature: No Abnormality No Abnormality No Abnormality Tenderness on Palpation: No No Yes Wound Preparation: Ulcer Cleansing: Ulcer Cleansing: Ulcer Cleansing: Rinsed/Irrigated with Saline, Rinsed/Irrigated with Saline, Rinsed/Irrigated with Saline, Other: soap and water Other: soap and water Other: soap and water Topical Anesthetic Applied: Topical  Anesthetic Applied: Topical Anesthetic Applied: Other: lidocaine 4% Other: lidocaine 4% Other: lidocaine 4% Procedures Performed: N/A N/A N/A Wound Number: 4 N/A N/A Photos: No Photos N/A N/A Wound Location: Left Knee N/A N/A Wounding Event: Trauma N/A N/A Primary Etiology: Trauma, Other N/A N/A Secondary Etiology: N/A N/A N/A Comorbid History: Hypertension, Type II N/A N/A Diabetes, Neuropathy Date Acquired: 04/07/2018 N/A N/A Weeks of Treatment: 1 N/A N/A Wound Status: Open N/A N/A Measurements L x W x D 3.7x2.5x0.1 N/A N/A (cm) Area (cm) : 7.265 N/A N/A Volume (cm) : 0.726 N/A N/A % Reduction in Area: 58.90% N/A N/A Treu, Tashon K. ([id number]) % Reduction in Volume: 58.90% N/A N/A Classification: Full Thickness Without N/A N/A Exposed Support Structures Exudate Amount: Large N/A N/A Exudate Type: Purulent N/A N/A Exudate Color: yellow, brown, green N/A N/A Wound Margin: Distinct, outline attached N/A N/A Granulation Amount: None Present (0%) N/A N/A Granulation Quality: N/A N/A N/A Necrotic Amount: Large (67-100%) N/A N/A Necrotic Tissue: Eschar N/A N/A Exposed Structures: Fascia: No N/A N/A Fat Layer (Subcutaneous Tissue) Exposed: No Tendon: No Muscle: No Joint: No Bone: No Epithelialization: Large (67-100%)  N/A N/A Debridement: Debridement - Selective/Open N/A N/A Wound Pre-procedure 11:53 N/A N/A Verification/Time Out Taken: Pain Control: Other N/A N/A Tissue Debrided: Necrotic/Eschar N/A N/A Level: Non-Viable Tissue N/A N/A Debridement Area (sq cm): 2.25 N/A N/A Instrument: Curette N/A N/A Bleeding: None N/A N/A Hemostasis Achieved: Pressure N/A N/A Procedural Pain: 0 N/A N/A Post Procedural Pain: 0 N/A N/A Debridement Treatment Procedure was tolerated well N/A N/A Response: Post Debridement 3.7x2.5x0.1 N/A N/A Measurements L x W x D (cm) Post Debridement Volume: 0.726 N/A N/A (cm) Periwound Skin Texture: Excoriation: No N/A N/A Induration:  No Callus: No Crepitus: No Rash: No Scarring: No Periwound Skin Moisture: Maceration: No N/A N/A Dry/Scaly: No Periwound Skin Color: Atrophie Blanche: No N/A N/A Cyanosis: No Ecchymosis: No Erythema: No Hemosiderin Staining: No Mottled: No Pallor: No Rubor: No Temperature: No Abnormality N/A N/A Tenderness on Palpation: Yes N/A N/A Wound Preparation: N/A N/A ERASTUS, BARTOLOMEI ([id number]) Ulcer Cleansing: Rinsed/Irrigated with Saline Topical Anesthetic Applied: Other: lidocaine 4% Procedures Performed: Debridement N/A N/A Treatment Notes Wound #3 (Right, Proximal, Anterior Lower Leg) 1. Cleansed with: Cleanse wound with antibacterial soap and water 3. Peri-wound Care: Moisturizing lotion 4. Dressing Applied: Other dressing (specify in notes) 5. Secondary Dressing Applied ABD Pad 7. Secured with 4-Layer Compression System - Right Lower Extremity Notes [facility] Wound #4 (Left Knee) 1. Cleansed with: Clean wound with Normal Saline 4. Dressing Applied: Other dressing (specify in notes) 5. Secondary Dressing Applied Bordered Foam Dressing Notes [facility] Electronic Signature(s) Signed: 04/22/2018 1:16:01 PM By: Linton Ham MD Previous Signature: 04/21/2018 5:47:31 PM Version By: Gretta Cool, BSN, RN, CWS, Kim RN, BSN Entered By: Linton Ham on 04/22/2018 08:00:34 [name] ([id number]) -------------------------------------------------------------------------------- Multi-Disciplinary Care Plan Details Patient Name: [name] Date of Service: 04/21/2018 11:15 AM Medical Record Number: [id number] Patient Account Number: [id number] Date of Birth/Sex: 03-12-1961 (57 y.o. M) Treating RN: Cornell Barman Primary Care Kirstine Jacquin: [name] Other Clinician: Referring Viki Carrera: [name] Treating Dayshon Roback/Extender: Tito Dine in Treatment: 4 Active Inactive ` Orientation to the Wound Care Program Nursing Diagnoses: Knowledge deficit related to  the wound healing center program Goals: Patient/caregiver will verbalize understanding of the [facility] Date Initiated: 03/24/2018 Target Resolution Date: 04/23/2018 Goal Status: Active Interventions: Provide education on orientation to the wound center Notes: ` Soft Tissue Infection Nursing Diagnoses: Potential for infection: soft tissue Goals: Patient will remain free of wound infection Date Initiated: 03/24/2018 Target Resolution Date: 04/23/2018 Goal Status: Active Interventions: Assess signs and symptoms of infection every visit Treatment Activities: Systemic antibiotics : 03/24/2018 Notes: ` Wound/Skin Impairment Nursing Diagnoses: Impaired tissue integrity Goals: Ulcer/skin breakdown will have a volume reduction of 80% by week 12 Date Initiated: 03/24/2018 Target Resolution Date: 07/24/2018 SANUEL, LADNIER ([id number]) Goal Status: Active Ulcer/skin breakdown will heal within 14 weeks Date Initiated: 03/24/2018 Target Resolution Date: 07/24/2018 Goal Status: Active Interventions: Assess ulceration(s) every visit Treatment Activities: Referred to DME Ronzell Laban for dressing supplies : 03/24/2018 Topical wound management initiated : 03/24/2018 Notes: Electronic Signature(s) Signed: 04/21/2018 5:47:31 PM By: Gretta Cool, BSN, RN, CWS, Kim RN, BSN Entered By: Gretta Cool, BSN, RN, CWS, Kim on 04/21/2018 11:49:26 [name] ([id number]) -------------------------------------------------------------------------------- Pain Assessment Details Patient Name: [name] Date of Service: 04/21/2018 11:15 AM Medical Record Number: [id number] Patient Account Number: [id number] Date of Birth/Sex: 1961-03-08 (57 y.o. M) Treating RN: Roger Shelter Primary Care Kenyon Eshleman: [name] Other Clinician: Referring Janneth Krasner: [name] Treating Roper Tolson/Extender: Tito Dine in Treatment: 4 Active Problems  Location of Pain Severity and Description of  Pain Patient Has Paino No Site Locations Pain Management and Medication Current Pain Management: Electronic Signature(s) Signed: 04/21/2018 4:46:18 PM By: Roger Shelter Entered By: Roger Shelter on 04/21/2018 11:16:29 [name] ([id number]) -------------------------------------------------------------------------------- Patient/Caregiver Education Details Patient Name: [name] Date of Service: 04/21/2018 11:15 AM Medical Record Number: [id number] Patient Account Number: [id number] Date of Birth/Gender: 1961-06-15 (57 y.o. M) Treating RN: Montey Hora Primary Care Physician: [name] Other Clinician: Referring Physician: [name] Treating Physician/Extender: Tito Dine in Treatment: 4 Education Assessment Education Provided To: Patient Education Topics Provided Venous: Handouts: Controlling Swelling with Compression Stockings Methods: Explain/Verbal Responses: State content correctly Electronic Signature(s) Signed: 04/22/2018 4:34:09 PM By: Montey Hora Entered By: Montey Hora on 04/21/2018 13:12:13 [name] ([id number]) -------------------------------------------------------------------------------- Wound Assessment Details Patient Name: [name] Date of Service: 04/21/2018 11:15 AM Medical Record Number: [id number] Patient Account Number: [id number] Date of Birth/Sex: [birth year] (57 y.o. M) Treating RN: Cornell Barman Primary Care Journi Moffa: [name] Other Clinician: Referring Gerrianne Aydelott: [name] Treating Shonia Skilling/Extender: Tito Dine in Treatment: 4 Wound Status Wound Number: 1 Primary Etiology: Diabetic Wound/Ulcer of the Lower Extremity Wound Location: Right Lower Leg - Posterior Secondary Venous Leg Ulcer Wounding Event: Trauma Etiology: Date Acquired: 11/23/2017 Wound Status: Healed - Epithelialized Weeks Of Treatment: 4 Comorbid History: Hypertension, Type II Diabetes, Clustered Wound:  No Neuropathy Photos Photo Uploaded By: Gretta Cool, BSN, RN, CWS, Kim on 04/22/2018 15:49:35 Wound Measurements Length: (cm) 0 % Redu Width: (cm) 0 % Redu Depth: (cm) 0 Epithe Area: (cm) 0 Volume: (cm) 0 ction in Area: 100% ction in Volume: 100% lialization: Small (1-33%) Wound Description Classification: Grade 1 Wound Margin: Flat and Intact Exudate Amount: Large Exudate Type: Serosanguineous Exudate Color: red, brown Foul Odor After Cleansing: No Slough/Fibrino Yes Wound Bed Granulation Amount: Small (1-33%) Exposed Structure Granulation Quality: Red Fascia Exposed: No Necrotic Amount: Large (67-100%) Fat Layer (Subcutaneous Tissue) Exposed: Yes Necrotic Quality: Eschar, Adherent Slough Tendon Exposed: No Muscle Exposed: No Joint Exposed: No Bone Exposed: No Periwound Skin Texture Schleich, Fabien K. ([id number]) Texture Color No Abnormalities Noted: No No Abnormalities Noted: No Callus: No Atrophie Blanche: No Crepitus: No Cyanosis: No Excoriation: Yes Ecchymosis: No Induration: No Erythema: No Rash: No Hemosiderin Staining: Yes Scarring: No Mottled: No Pallor: No Moisture Rubor: No No Abnormalities Noted: No Dry / Scaly: No Temperature / Pain Maceration: No Temperature: No Abnormality Wound Preparation Ulcer Cleansing: Rinsed/Irrigated with Saline, Other: soap and water, Topical Anesthetic Applied: Other: lidocaine 4%, Electronic Signature(s) Signed: 04/21/2018 5:47:31 PM By: Gretta Cool, BSN, RN, CWS, Kim RN, BSN Entered By: Gretta Cool, BSN, RN, CWS, Kim on 04/21/2018 11:58:56 [name] ([id number]) -------------------------------------------------------------------------------- Wound Assessment Details Patient Name: [name] Date of Service: 04/21/2018 11:15 AM Medical Record Number: [id number] Patient Account Number: [id number] Date of Birth/Sex: [birth year] (57 y.o. M) Treating RN: Cornell Barman Primary Care Sallee Hogrefe: [name] Other  Clinician: Referring Leeandra Ellerson: [name] Treating Hollace Michelli/Extender: Tito Dine in Treatment: 4 Wound Status Wound Number: 2 Primary Etiology: Diabetic Wound/Ulcer of the Lower Extremity Wound Location: Right, Anterior Lower Leg Secondary Venous Leg Ulcer Wounding Event: Trauma Etiology: Date Acquired: 11/23/2017 Wound Status: Healed - Epithelialized Weeks Of Treatment: 4 Comorbid History: Hypertension, Type II Diabetes, Clustered Wound: No Neuropathy Photos Photo Uploaded By: Gretta Cool, BSN, RN, CWS, Kim on 04/22/2018 15:50:45 Wound Measurements Length: (cm) 0 % Reduct Width: (cm) 0 % Reduct Depth: (cm) 0 Epitheli Area: (cm) 0  Tunneli Volume: (cm) 0 Undermi ion in Area: 100% ion in Volume: 100% alization: Small (1-33%) ng: No ning: No Wound Description Classification: Grade 1 Foul Od Wound Margin: Flat and Intact Slough/ Exudate Amount: Small Exudate Type: Serosanguineous Exudate Color: red, brown or After Cleansing: No Fibrino Yes Wound Bed Granulation Amount: Large (67-100%) Exposed Structure Granulation Quality: Red Fascia Exposed: No Necrotic Amount: None Present (0%) Fat Layer (Subcutaneous Tissue) Exposed: No Tendon Exposed: No Muscle Exposed: No Joint Exposed: No Bone Exposed: No Periwound Skin Texture Hargens, Destan K. ([id number]) Texture Color No Abnormalities Noted: No No Abnormalities Noted: No Callus: No Atrophie Blanche: No Crepitus: No Cyanosis: No Excoriation: No Ecchymosis: No Induration: No Erythema: No Rash: No Hemosiderin Staining: Yes Scarring: No Mottled: No Pallor: No Moisture Rubor: No No Abnormalities Noted: No Dry / Scaly: No Temperature / Pain Maceration: No Temperature: No Abnormality Wound Preparation Ulcer Cleansing: Rinsed/Irrigated with Saline, Other: soap and water, Topical Anesthetic Applied: Other: lidocaine 4%, Electronic Signature(s) Signed: 04/21/2018 5:47:31 PM By: Gretta Cool, BSN, RN, CWS,  Kim RN, BSN Entered By: Gretta Cool, BSN, RN, CWS, Kim on 04/21/2018 11:57:50 [name] ([id number]) -------------------------------------------------------------------------------- Wound Assessment Details Patient Name: [name] Date of Service: 04/21/2018 11:15 AM Medical Record Number: [id number] Patient Account Number: [id number] Date of Birth/Sex: March 31, 1961 (57 y.o. M) Treating RN: Roger Shelter Primary Care Faylynn Stamos: [name] Other Clinician: Referring Mutasim Tuckey: [name] Treating Anushree Dorsi/Extender: Tito Dine in Treatment: 4 Wound Status Wound Number: 3 Primary Etiology: Diabetic Wound/Ulcer of the Lower Extremity Wound Location: Right Lower Leg - Anterior, Proximal Wound Status: Open Wounding Event: Gradually Appeared Comorbid Hypertension, Type II Diabetes, Date Acquired: 04/14/2018 History: Neuropathy Weeks Of Treatment: 1 Clustered Wound: No Photos Photo Uploaded By: Gretta Cool, BSN, RN, CWS, Kim on 04/22/2018 15:50:46 Wound Measurements Length: (cm) 0.5 Width: (cm) 0.5 Depth: (cm) 0.1 Area: (cm) 0.196 Volume: (cm) 0.02 % Reduction in Area: 0% % Reduction in Volume: 0% Epithelialization: None Tunneling: No Undermining: No Wound Description Classification: Grade 1 Wound Margin: Distinct, outline attached Exudate Amount: Small Exudate Type: Serosanguineous Exudate Color: red, brown Foul Odor After Cleansing: No Slough/Fibrino Yes Wound Bed Granulation Amount: None Present (0%) Exposed Structure Necrotic Amount: Large (67-100%) Fascia Exposed: No Necrotic Quality: Eschar Fat Layer (Subcutaneous Tissue) Exposed: No Tendon Exposed: No Muscle Exposed: No Joint Exposed: No Bone Exposed: No Periwound Skin Texture Mella, Fremon K. ([id number]) Texture Color No Abnormalities Noted: No No Abnormalities Noted: No Moisture Temperature / Pain No Abnormalities Noted: No Temperature: No Abnormality Tenderness on Palpation: Yes Wound  Preparation Ulcer Cleansing: Rinsed/Irrigated with Saline, Other: soap and water, Topical Anesthetic Applied: Other: lidocaine 4%, Treatment Notes Wound #3 (Right, Proximal, Anterior Lower Leg) 1. Cleansed with: Cleanse wound with antibacterial soap and water 3. Peri-wound Care: Moisturizing lotion 4. Dressing Applied: Other dressing (specify in notes) 5. Secondary Dressing Applied ABD Pad 7. Secured with 4-Layer Compression System - Right Lower Extremity Notes [facility] Electronic Signature(s) Signed: 04/21/2018 4:46:18 PM By: Roger Shelter Entered By: Roger Shelter on 04/21/2018 11:29:28 [name] ([id number]) -------------------------------------------------------------------------------- Wound Assessment Details Patient Name: [name] Date of Service: 04/21/2018 11:15 AM Medical Record Number: [id number] Patient Account Number: [id number] Date of Birth/Sex: [birth year] (57 y.o. M) Treating RN: Roger Shelter Primary Care Myia Bergh: [name] Other Clinician: Referring Katelynne Revak: [name] Treating Younis Mathey/Extender: Tito Dine in Treatment: 4 Wound Status Wound Number: 4 Primary Etiology: Trauma, Other Wound Location: Left Knee Wound Status: Open Wounding Event: Trauma Comorbid Hypertension, Type  II Diabetes, History: Neuropathy Date Acquired: 04/07/2018 Weeks Of Treatment: 1 Clustered Wound: No Photos Photo Uploaded By: Gretta Cool, BSN, RN, CWS, Kim on 04/22/2018 15:51:17 Wound Measurements Length: (cm) 3.7 Width: (cm) 2.5 Depth: (cm) 0.1 Area: (cm) 7.265 Volume: (cm) 0.726 % Reduction in Area: 58.9% % Reduction in Volume: 58.9% Epithelialization: Large (67-100%) Tunneling: No Undermining: No Wound Description Full Thickness Without Exposed Support Foul Classification: Structures Sloug Wound Margin: Distinct, outline attached Exudate Large Amount: Exudate Type: Purulent Exudate Color: yellow, brown, green Odor  After Cleansing: No h/Fibrino Yes Wound Bed Granulation Amount: None Present (0%) Exposed Structure Necrotic Amount: Large (67-100%) Fascia Exposed: No Necrotic Quality: Eschar Fat Layer (Subcutaneous Tissue) Exposed: No Tendon Exposed: No Muscle Exposed: No Joint Exposed: No Bone Exposed: No Lenz, Constant K. ([id number]) Periwound Skin Texture Texture Color No Abnormalities Noted: No No Abnormalities Noted: No Callus: No Atrophie Blanche: No Crepitus: No Cyanosis: No Excoriation: No Ecchymosis: No Induration: No Erythema: No Rash: No Hemosiderin Staining: No Scarring: No Mottled: No Pallor: No Moisture Rubor: No No Abnormalities Noted: No Dry / Scaly: No Temperature / Pain Maceration: No Temperature: No Abnormality Tenderness on Palpation: Yes Wound Preparation Ulcer Cleansing: Rinsed/Irrigated with Saline Topical Anesthetic Applied: Other: lidocaine 4%, Treatment Notes Wound #4 (Left Knee) 1. Cleansed with: Clean wound with Normal Saline 4. Dressing Applied: Other dressing (specify in notes) 5. Secondary Dressing Applied Bordered Foam Dressing Notes [facility] Electronic Signature(s) Signed: 04/21/2018 4:46:18 PM By: Roger Shelter Entered By: Roger Shelter on 04/21/2018 11:26:43 [name] ([id number]) -------------------------------------------------------------------------------- Vitals Details Patient Name: [name] Date of Service: 04/21/2018 11:15 AM Medical Record Number: [id number] Patient Account Number: [id number] Date of Birth/Sex: [birth year] (57 y.o. M) Treating RN: Roger Shelter Primary Care Cleaster Shiffer: [name] Other Clinician: Referring Robbin Escher: [name] Treating Neal Trulson/Extender: Tito Dine in Treatment: 4 Vital Signs Time Taken: 11:16 Temperature (F): 98.2 Height (in): 75 Pulse (bpm): 59 Weight (lbs): 343 Respiratory Rate (breaths/min): 18 Body Mass Index (BMI): 42.9 Blood Pressure (mmHg):  165/86 Reference Range: 80 - 120 mg / dl Electronic Signature(s) Signed: 04/21/2018 4:46:18 PM By: Roger Shelter Entered By: Roger Shelter on 04/21/2018 11:17:58

## 2018-04-25 NOTE — Progress Notes (Signed)
GJON, LETARTE ([id number]) Visit Report for 04/21/2018 Debridement Details Patient Name: [name], [name] Date of Service: 04/21/2018 11:15 AM Medical Record Number: [id number] Patient Account Number: [id number] Date of Birth/Sex: [birth year] (57 y.o. M) Treating RN: Cornell Barman Primary Care Provider: [name] Other Clinician: Referring Provider: [name] Treating Provider/Extender: Tito Dine in Treatment: 4 Debridement Performed for Wound #4 Left Knee Assessment: Performed By: Physician Ricard Dillon, MD Debridement Type: Debridement Pre-procedure Verification/Time Yes - 11:53 Out Taken: Start Time: 11:53 Pain Control: Other : lidocaine 4% Total Area Debrided (L x W): 1.5 (cm) x 1.5 (cm) = 2.25 (cm) Tissue and other material Non-Viable, Eschar debrided: Level: Non-Viable Tissue Debridement Description: Selective/Open Wound Instrument: Curette Bleeding: None Hemostasis Achieved: Pressure End Time: 11:55 Procedural Pain: 0 Post Procedural Pain: 0 Response to Treatment: Procedure was tolerated well Level of Consciousness: Awake and Alert Post Procedure Vitals: Temperature: 98.2 Pulse: 59 Respiratory Rate: 18 Blood Pressure: Systolic Blood Pressure: 294 Diastolic Blood Pressure: 86 Post Debridement Measurements of Total Wound Length: (cm) 3.7 Width: (cm) 2.5 Depth: (cm) 0.1 Volume: (cm) 0.726 Character of Wound/Ulcer Post Debridement: Stable Post Procedure Diagnosis Same as Pre-procedure Electronic Signature(s) Signed: 04/22/2018 1:16:01 PM By: Linton Ham MD Signed: 04/22/2018 5:41:36 PM By: Gretta Cool BSN, RN, CWS, Kim RN, BSN Farrior, [name] ([id number]) Previous Signature: 04/21/2018 5:47:31 PM Version By: Gretta Cool, BSN, RN, CWS, Kim RN, BSN Entered By: Linton Ham on 04/22/2018 08:00:45 [name] ([id number]) -------------------------------------------------------------------------------- HPI Details Patient Name: [name] Date of Service: 04/21/2018 11:15 AM Medical Record Number: [id number] Patient Account Number: [id number] Date of Birth/Sex: 03-13-1961 (57 y.o. M) Treating RN: Cornell Barman Primary Care Provider: [name] Other Clinician: Referring Provider: [name] Treating Provider/Extender: Tito Dine in Treatment: 4 History of Present Illness HPI Description: 03/24/18; patient is a 57 year old type II diabetic on oral agents. He tells me he has right foot drop secondary to previous back surgery had some years ago. He wears a brace on the right leg and walks with a quad cane. He had the brace adjusted in December/18 and apparently did not fit properly and he developed wounds predominantly on the posterior right calf where the brace rubbed his leg. The patient describes this as acute he noticed it right away and was able to have the brace readjusted. He subsequently psoas had some wounds on the anterior right leg which she says are tape abrasion injuries. His primary doctor is Dr. Rex Kras at [facility]. It looks as though Dr. Rex Kras saw this initially in January. Started him on cephalexin. Patient was seen again on 03/19/17 without much improvement. He has been using Neosporin to the wound areas. He was started on Augmentin on 03/19/18. The patient does not have a wound history. He has a history of a fractured left patella in 2017. Chronic foot drop on the right apparently related to back surgery. Hyperlipidemia and essential hypertension. 03/24/18; the patient still has a scattering of wounds on the anterior medial extending posteriorly in the right leg. The advertising as this is being secondary to abrasion injury although there is a lot of irritation here and one would have to wonder whether this represents chronic venous inflammation/stasis dermatitis. In any case of that's true it was not well recognized. 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 He is completing  Augmentin at this point I don't think he needs additional antibiotics. we applied silver alginate under 4-layer compression which the patient tolerated  well 04/07/18; the patient's major wound areas on the medial right leg. This is in the setting of chronic venous insufficiency/stasis dermatitis and lymphedema. We applied silver alginate under for layer compression. He's been scratching at this through the stop under the stockings. He has superficial wounds just below the anterior stocking line. He also fell and the [employer] parking lot superficial "road burn" on the left knee and left elbow. We did not the define these areas 04/14/18; since wounds on the right leg look better. He still requires debridement especially on the lower medial calf area we've been using silver alginate under compressionat 4 layer. One of the excoriations from the fall last week had a greenish drainage per her intake nurse. This is on the left knee. 04/21/18; wounds on his right leg continued to improve some of them are closed. No debridement is required on the right. The large area of excoriation over the left patella from a fall last week has thick surface eschar which required debridement today but I think is on his way to healing. The patient had venous reflux studies. He does not have either deep or superficial thrombosis in either lower extremity. His reflux evaluation on the right did not comment on any major reflux in the right thigh or right distal superficial veins. He had more abnormalities on the left. These reports are getting more difficult to interpret they didn't comment on anything that would require an ablation in the superficial veins below the knee. I think he will need ongoing compression. Electronic Signature(s) Signed: 04/22/2018 1:16:01 PM By: Linton Ham MD Entered By: Linton Ham on 04/22/2018 08:03:59 [name]  ([id number]) -------------------------------------------------------------------------------- Physical Exam Details Patient Name: [name] Date of Service: 04/21/2018 11:15 AM Medical Record Number: [id number] Patient Account Number: [id number] Date of Birth/Sex: 1961-10-03 (57 y.o. M) Treating RN: Cornell Barman Primary Care Provider: [name] Other Clinician: Referring Provider: [name] Treating Provider/Extender: Tito Dine in Treatment: 4 Constitutional Patient is hypertensive.. Pulse regular and within target range for patient.Marland Kitchen Respirations regular, non-labored and within target range.. Temperature is normal and within the target range for the patient.Marland Kitchen appears in no distress. Notes Wound exam; the areas on his right anterior leg are closed. The cluster medially on the right medial lower calf and ankle also are mostly closed. There is still considerable inflammation in this area. Nevertheless his reflux studies did not point out right superficial greater saphenous vein reflux. oOn the left anterior knee he has considerable eschar here. I remove most of this area the wounds look healthy and should progress towards closure Electronic Signature(s) Signed: 04/22/2018 1:16:01 PM By: Linton Ham MD Entered By: Linton Ham on 04/22/2018 08:05:12 [name] ([id number]) -------------------------------------------------------------------------------- Physician Orders Details Patient Name: [name] Date of Service: 04/21/2018 11:15 AM Medical Record Number: [id number] Patient Account Number: [id number] Date of Birth/Sex: June 05, 1961 (57 y.o. M) Treating RN: Cornell Barman Primary Care Provider: [name] Other Clinician: Referring Provider: [name] Treating Provider/Extender: Tito Dine in Treatment: 4 Verbal / Phone Orders: No Diagnosis Coding Wound Cleansing Wound #3 Right,Proximal,Anterior Lower Leg o Cleanse wound with  mild soap and water Wound #4 Left Knee o Cleanse wound with mild soap and water Anesthetic (add to Medication List) Wound #3 Right,Proximal,Anterior Lower Leg o Topical Lidocaine 4% cream applied to wound bed prior to debridement (In Clinic Only). Wound #4 Left Knee o Topical Lidocaine 4% cream applied to wound bed prior to debridement (In Clinic Only).  Skin Barriers/Peri-Wound Care Wound #3 Right,Proximal,Anterior Lower Leg o Triamcinolone Acetonide Ointment (TAC) Primary Wound Dressing Wound #3 Right,Proximal,Anterior Lower Leg o Silver Alginate Wound #4 Left Knee o Silver Alginate Secondary Dressing Wound #3 Right,Proximal,Anterior Lower Leg o ABD pad o Other - Absorptive dressing Wound #4 Left Knee o Boardered Foam Dressing Dressing Change Frequency Wound #3 Right,Proximal,Anterior Lower Leg o Change dressing every week Wound #4 Left Knee o Change dressing every week Follow-up Appointments Wound #3 Right,Proximal,Anterior Lower Leg JAMMIE, CLINK. ([id number]) o Return Appointment in 1 week. o Nurse Visit as needed Wound #4 Left Knee o Return Appointment in 1 week. o Nurse Visit as needed Edema Control Wound #3 Right,Proximal,Anterior Lower Leg o 4-Layer Compression System - Right Lower Extremity o Elevate legs to the level of the heart and pump ankles as often as possible Additional Orders / Instructions Wound #3 Right,Proximal,Anterior Lower Leg o Activity as tolerated Wound #4 Left Knee o Activity as tolerated Electronic Signature(s) Signed: 04/21/2018 5:47:31 PM By: Gretta Cool, BSN, RN, CWS, Kim RN, BSN Signed: 04/22/2018 1:16:01 PM By: Linton Ham MD Entered By: Gretta Cool, BSN, RN, CWS, Kim on 04/21/2018 12:01:36 [name], [name] ([id number]) -------------------------------------------------------------------------------- Problem List Details Patient Name: [name] Date of Service: 04/21/2018 11:15 AM Medical Record Number:  [id number] Patient Account Number: [id number] Date of Birth/Sex: 02-14-1961 (57 y.o. 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 I87.331 Chronic venous hypertension (idiopathic) with ulcer and 03/24/2018 Yes inflammation of right lower extremity E11.622 Type 2 diabetes mellitus with other skin ulcer 03/24/2018 Yes I89.0 Lymphedema, not elsewhere classified 03/24/2018 Yes S81.012D Laceration without foreign body, left knee, subsequent 04/14/2018 Yes encounter Inactive Problems Resolved Problems Electronic Signature(s) Signed: 04/22/2018 1:16:01 PM By: Linton Ham MD Entered By: Linton Ham on 04/22/2018 08:00:07 [name] ([id number]) -------------------------------------------------------------------------------- Progress Note Details Patient Name: [name] Date of Service: 04/21/2018 11:15 AM Medical Record Number: [id number] Patient Account Number: [id number] Date of Birth/Sex: 05-Jan-1961 (57 y.o. M) Treating RN: Cornell Barman Primary Care Provider: [name] Other Clinician: Referring Provider: [name] Treating Provider/Extender: Tito Dine in Treatment: 4 Subjective History of Present Illness (HPI) 03/24/18; patient is a 57 year old type II diabetic on oral agents. He tells me he has right foot drop secondary to previous back surgery had some years ago. He wears a brace on the right leg and walks with a quad cane. He had the brace adjusted in December/18 and apparently did not fit properly and he developed wounds predominantly on the posterior right calf where the brace rubbed his leg. The patient describes this as acute he  noticed it right away and was able to have the brace readjusted. He subsequently psoas had some wounds on the anterior right leg which she says are tape abrasion injuries. His primary doctor is Dr. Rex Kras at [facility]. It looks as though Dr. Rex Kras saw this initially in January. Started him on cephalexin. Patient was seen again on 03/19/17 without much improvement. He has been using Neosporin to the wound areas. He was started on Augmentin on 03/19/18. The patient does not have a wound history. He has a history of a fractured left patella in 2017. Chronic foot drop on the right apparently related to back surgery. Hyperlipidemia  and essential hypertension. 03/24/18; the patient still has a scattering of wounds on the anterior medial extending posteriorly in the right leg. The advertising as this is being secondary to abrasion injury although there is a lot of irritation here and one would have to wonder whether this represents chronic venous inflammation/stasis dermatitis. In any case of that's true it was not well recognized. 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 He is completing Augmentin at this point I don't think he needs additional antibiotics. we applied silver alginate under 4-layer compression which the patient tolerated well 04/07/18; the patient's major wound areas on the medial right leg. This is in the setting of chronic venous insufficiency/stasis dermatitis and lymphedema. We applied silver alginate under for layer compression. He's been scratching at this through the stop under the stockings. He has superficial wounds just below the anterior stocking line. He also fell and the [employer] parking lot superficial "road burn" on the left knee and left elbow. We did not the define these areas 04/14/18; since wounds on the right leg look better. He still requires debridement especially on the lower medial calf area we've been using silver alginate under compressionat 4  layer. One of the excoriations from the fall last week had a greenish drainage per her intake nurse. This is on the left knee. 04/21/18; wounds on his right leg continued to improve some of them are closed. No debridement is required on the right. The large area of excoriation over the left patella from a fall last week has thick surface eschar which required debridement today but I think is on his way to healing. The patient had venous reflux studies. He does not have either deep or superficial thrombosis in either lower extremity. His reflux evaluation on the right did not comment on any major reflux in the right thigh or right distal superficial veins. He had more abnormalities on the left. These reports are getting more difficult to interpret they didn't comment on anything that would require an ablation in the superficial veins below the knee. I think he will need ongoing compression. Objective Constitutional Patient is hypertensive.. Pulse regular and within target range for patient.Marland Kitchen Respirations regular, non-labored and within target range.. Temperature is normal and within the target range for the patient.Marland Kitchen appears in no distress. ALSON, MCPHEETERS ([id number]) Vitals Time Taken: 11:16 AM, Height: 75 in, Weight: 343 lbs, BMI: 42.9, Temperature: 98.2 F, Pulse: 59 bpm, Respiratory Rate: 18 breaths/min, Blood Pressure: 165/86 mmHg. General Notes: Wound exam; the areas on his right anterior leg are closed. The cluster medially on the right medial lower calf and ankle also are mostly closed. There is still considerable inflammation in this area. Nevertheless his reflux studies did not point out right superficial greater saphenous vein reflux. On the left anterior knee he has considerable eschar here. I remove most of this area the wounds look healthy and should progress towards closure Integumentary (Hair, Skin) Wound #1 status is Healed - Epithelialized. Original cause of wound was Trauma. The  wound is located on the Right,Posterior Lower Leg. The wound measures 0cm length x 0cm width x 0cm depth; 0cm^2 area and 0cm^3 volume. There is Fat Layer (Subcutaneous Tissue) Exposed exposed. There is a large amount of serosanguineous drainage noted. The wound margin is flat and intact. There is small (1-33%) red granulation within the wound bed. There is a large (67-100%) amount of necrotic tissue within the wound bed including Eschar and Adherent Slough. The periwound skin appearance  exhibited: Excoriation, Hemosiderin Staining. The periwound skin appearance did not exhibit: Callus, Crepitus, Induration, Rash, Scarring, Dry/Scaly, Maceration, Atrophie Blanche, Cyanosis, Ecchymosis, Mottled, Pallor, Rubor, Erythema. Periwound temperature was noted as No Abnormality. Wound #2 status is Healed - Epithelialized. Original cause of wound was Trauma. The wound is located on the Right,Anterior Lower Leg. The wound measures 0cm length x 0cm width x 0cm depth; 0cm^2 area and 0cm^3 volume. There is no tunneling or undermining noted. There is a small amount of serosanguineous drainage noted. The wound margin is flat and intact. There is large (67-100%) red granulation within the wound bed. There is no necrotic tissue within the wound bed. The periwound skin appearance exhibited: Hemosiderin Staining. The periwound skin appearance did not exhibit: Callus, Crepitus, Excoriation, Induration, Rash, Scarring, Dry/Scaly, Maceration, Atrophie Blanche, Cyanosis, Ecchymosis, Mottled, Pallor, Rubor, Erythema. Periwound temperature was noted as No Abnormality. Wound #3 status is Open. Original cause of wound was Gradually Appeared. The wound is located on the Right,Proximal,Anterior Lower Leg. The wound measures 0.5cm length x 0.5cm width x 0.1cm depth; 0.196cm^2 area and 0.02cm^3 volume. There is no tunneling or undermining noted. There is a small amount of serosanguineous drainage noted. The wound margin is distinct  with the outline attached to the wound base. There is no granulation within the wound bed. There is a large (67-100%) amount of necrotic tissue within the wound bed including Eschar. Periwound temperature was noted as No Abnormality. The periwound has tenderness on palpation. Wound #4 status is Open. Original cause of wound was Trauma. The wound is located on the Left Knee. The wound measures 3.7cm length x 2.5cm width x 0.1cm depth; 7.265cm^2 area and 0.726cm^3 volume. There is no tunneling or undermining noted. There is a large amount of purulent drainage noted. The wound margin is distinct with the outline attached to the wound base. There is no granulation within the wound bed. There is a large (67-100%) amount of necrotic tissue within the wound bed including Eschar. The periwound skin appearance did not exhibit: Callus, Crepitus, Excoriation, Induration, Rash, Scarring, Dry/Scaly, Maceration, Atrophie Blanche, Cyanosis, Ecchymosis, Hemosiderin Staining, Mottled, Pallor, Rubor, Erythema. Periwound temperature was noted as No Abnormality. The periwound has tenderness on palpation. 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 ([id number]) Procedures Wound #4 Pre-procedure diagnosis of Wound #4 is a Trauma, Other located on the Left Knee . There was a Selective/Open Wound Non-Viable Tissue Debridement with a total area of 2.25 sq cm performed by Ricard Dillon, MD. With the following instrument(s): Curette. to remove Non-Viable tissue/material Material removed includes Eschar after achieving pain control using Other (lidocaine  4%). No specimens were taken. A time out was conducted at 11:53, prior to the start of the procedure. There was no bleeding. The procedure was tolerated well with a pain level of 0 throughout and a pain level of 0 following the procedure. Patient s Level of Consciousness post procedure was recorded as Awake and Alert and post-procedure vitals were taken including Temperature: 98.2 F, Pulse: 59 bpm, Respiratory Rate: 18 breaths/min, Blood Pressure: (165)/(86) mmHg. Post Debridement Measurements: 3.7cm length x 2.5cm width x 0.1cm depth; 0.726cm^3 volume. Character of Wound/Ulcer Post Debridement is stable. Post procedure Diagnosis  Wound #4: Same as Pre-Procedure Plan Wound Cleansing: Wound #3 Right,Proximal,Anterior Lower Leg: Cleanse wound with mild soap and water Wound #4 Left Knee: Cleanse wound with mild soap and water Anesthetic (add to Medication List): Wound #3 Right,Proximal,Anterior Lower Leg: Topical Lidocaine 4% cream applied to wound bed prior to debridement (In Clinic Only). Wound #4 Left Knee: Topical Lidocaine 4% cream applied to wound bed prior to debridement (In Clinic Only). Skin Barriers/Peri-Wound Care: Wound #3 Right,Proximal,Anterior Lower Leg: Triamcinolone Acetonide Ointment (TAC) Primary Wound Dressing: Wound #3 Right,Proximal,Anterior Lower Leg: Silver Alginate Wound #4 Left Knee: Silver Alginate Secondary Dressing: Wound #3 Right,Proximal,Anterior Lower Leg: ABD pad Other - Absorptive dressing Wound #4 Left Knee: Boardered Foam Dressing Dressing Change Frequency: Wound #3 Right,Proximal,Anterior Lower Leg: Change dressing every week Wound #4 Left Knee: Change dressing every week Follow-up Appointments: Wound #3 Right,Proximal,Anterior Lower Leg: Return Appointment in 1 week. Nurse Visit as needed Wound #4 Left Knee: KAMARION, ZAGAMI ([id number]) Return Appointment in 1 week. Nurse Visit as needed Edema Control: Wound #3 Right,Proximal,Anterior  Lower Leg: 4-Layer Compression System - Right Lower Extremity Elevate legs to the level of the heart and pump ankles as often as possible Additional Orders / Instructions: Wound #3 Right,Proximal,Anterior Lower Leg: Activity as tolerated Wound #4 Left Knee: Activity as tolerated #1Changed to silver alginate to the left kneeWith border foam #2Continue Triamcinolone to the right leg. To the small open areas silver alginate. 4 layer compression on the Right leg #3 the patient to get stockings. He may be dischargeable next week Electronic Signature(s) Signed: 04/22/2018 1:16:01 PM By: Linton Ham MD Entered By: Linton Ham on 04/22/2018 08:07:18 [name] ([id number]) -------------------------------------------------------------------------------- SuperBill Details Patient Name: [name] Date of Service: 04/21/2018 Medical Record Number: [id number] Patient Account Number: [id number] Date of Birth/Sex: April 13, 1961 (57 y.o. M) Treating RN: Cornell Barman Primary Care Provider: [name] Other Clinician: Referring Provider: [name] Treating Provider/Extender: Tito Dine in Treatment: 4 Diagnosis Coding ICD-10 Codes Code Description [phone] Non-pressure chronic ulcer of right calf limited to breakdown of skin I87.331 Chronic venous hypertension (idiopathic) with ulcer and inflammation of right lower extremity E11.622 Type 2 diabetes mellitus with other skin ulcer I89.0 Lymphedema, not elsewhere classified S81.012D Laceration without foreign body, left knee, subsequent encounter Facility Procedures CPT4: Description Modifier Quantity Code [id number] - DEBRIDE WOUND 1ST 20 SQ CM OR < 1 ICD-10 Diagnosis Description L97.211 Non-pressure chronic ulcer of right calf limited to breakdown of skin I87.331 Chronic venous hypertension (idiopathic) with  ulcer and inflammation of right lower extremity Physician Procedures CPT4: Description Modifier Quantity Code  [id number] - WC PHYS DEBR WO ANESTH 20 SQ CM 1 ICD-10 Diagnosis Description L97.211 Non-pressure chronic ulcer of right calf limited to breakdown of skin I87.331 Chronic venous hypertension (idiopathic) with  ulcer and inflammation of right lower extremity Electronic Signature(s) Signed: 04/22/2018 1:16:01 PM By: Linton Ham MD Entered By: Linton Ham on 04/22/2018 08:07:34

## 2018-04-28 ENCOUNTER — Encounter: Payer: Medicare HMO | Admitting: Internal Medicine

## 2018-04-28 DIAGNOSIS — L97819 Non-pressure chronic ulcer of other part of right lower leg with unspecified severity: Secondary | ICD-10-CM | POA: Diagnosis not present

## 2018-04-28 DIAGNOSIS — W19XXXA Unspecified fall, initial encounter: Secondary | ICD-10-CM | POA: Diagnosis not present

## 2018-04-28 DIAGNOSIS — L97211 Non-pressure chronic ulcer of right calf limited to breakdown of skin: Secondary | ICD-10-CM | POA: Diagnosis not present

## 2018-04-28 DIAGNOSIS — I890 Lymphedema, not elsewhere classified: Secondary | ICD-10-CM | POA: Diagnosis not present

## 2018-04-28 DIAGNOSIS — S81002A Unspecified open wound, left knee, initial encounter: Secondary | ICD-10-CM | POA: Diagnosis not present

## 2018-04-28 DIAGNOSIS — L97812 Non-pressure chronic ulcer of other part of right lower leg with fat layer exposed: Secondary | ICD-10-CM | POA: Diagnosis not present

## 2018-04-28 DIAGNOSIS — I87331 Chronic venous hypertension (idiopathic) with ulcer and inflammation of right lower extremity: Secondary | ICD-10-CM | POA: Diagnosis not present

## 2018-04-28 DIAGNOSIS — S81012A Laceration without foreign body, left knee, initial encounter: Secondary | ICD-10-CM | POA: Diagnosis not present

## 2018-04-28 DIAGNOSIS — E11622 Type 2 diabetes mellitus with other skin ulcer: Secondary | ICD-10-CM | POA: Diagnosis not present

## 2018-04-29 ENCOUNTER — Encounter: Payer: Medicare HMO | Attending: Family Medicine | Admitting: Registered"

## 2018-04-29 DIAGNOSIS — E119 Type 2 diabetes mellitus without complications: Secondary | ICD-10-CM | POA: Insufficient documentation

## 2018-04-29 DIAGNOSIS — Z713 Dietary counseling and surveillance: Secondary | ICD-10-CM | POA: Insufficient documentation

## 2018-04-29 NOTE — Patient Instructions (Addendum)
Aim to eat balance meals and snacks, not too much carbohydrates and include protein when eating carbs. Continue to check your blood sugar when having low blood sugar symptoms and treat with 15 grams carbs. When you are able continue with your plans to get more exercise Consider checking your blood sugar 2 hours after a meal and keep in mind that the goal is to have your blood sugar 180 or below.

## 2018-04-29 NOTE — Progress Notes (Signed)
Diabetes Self-Management Education  Visit Type: First/Initial  Appt. Start Time: 1030 Appt. End Time: 1130  05/03/2018  [name], identified by name and date of birth, is a 57 y.o. male with a diagnosis of Diabetes: Type 2.   ASSESSMENT Pt states he has gained 20-40 lbs since he became less mobile with knee and leg issues. Pt states he gets the brace off on Wed and plans to ride his bike and start going to the pool again. Pt states his sister also has T2DM and has been pushing him to take some supplements. Pt reports frequent hypoglycemic events and may be over-treating and not following up with protein meal or snack. Pt states he checks FBG, and 2-3x week before lunch and/or at night.  Pt states he has been seeing wound care due to slow healing wound from wear the brace has been rubbing his leg.   Diabetes Self-Management Education - 04/29/18 1041      Visit Information   Visit Type  First/Initial      Initial Visit   Diabetes Type  Type 2    Are you currently following a meal plan?  No    Are you taking your medications as prescribed?  Yes Metformin, glipizide, actos    Date Diagnosed  1993      Health Coping   How would you rate your overall health?  Fair      Psychosocial Assessment   Patient Belief/Attitude about Diabetes  Motivated to manage diabetes    How often do you need to have someone help you when you read instructions, pamphlets, or other written materials from your doctor or pharmacy?  1 - Never    What is the last grade level you completed in school?  associates degree      Complications   Last HgB A1C per patient/outside source  6.6 %    How often do you check your blood sugar?  1-2 times/day    Fasting Blood glucose range (mg/dL)  130-179;70-129 80-150    Number of hypoglycemic episodes per month  4    Can you tell when your blood sugar is low?  Yes    What do you do if your blood sugar is low?  hard candy    Number of hyperglycemic episodes per week   0    Have you had a dilated eye exam in the past 12 months?  Yes    Have you had a dental exam in the past 12 months?  Yes    Are you checking your feet?  Yes    How many days per week are you checking your feet?  7      Dietary Intake   Breakfast  ham, cheese omlette OR chicken club biscuit, large sweet tea    Snack (morning)  none OR oatmeal cookie w cream OR PB crackers    Lunch  left overs    Snack (afternoon)  none    Dinner  [employer], mac & cheese OR hot dog or hamburger, fries, tea    Snack (evening)  PB crackers    Beverage(s)  ice water, sweet tea, diet soda, coffee with sweet n low, creamer      Exercise   Exercise Type  ADL's    How many days per week to you exercise?  0    How many minutes per day do you exercise?  0    Total minutes per week of exercise  0  Patient Education   Previous Diabetes Education  Yes (please comment) 1993    Nutrition management   Role of diet in the treatment of diabetes and the relationship between the three main macronutrients and blood glucose level    Physical activity and exercise   Role of exercise on diabetes management, blood pressure control and cardiac health.    Medications  Reviewed patients medication for diabetes, action, purpose, timing of dose and side effects.    Monitoring  Purpose and frequency of SMBG.    Acute complications  Taught treatment of hypoglycemia - the 15 rule.      Individualized Goals (developed by patient)   Nutrition  General guidelines for healthy choices and portions discussed    Physical Activity  Exercise 3-5 times per week    Monitoring   test my blood glucose as discussed      Outcomes   Expected Outcomes  Demonstrated interest in learning. Expect positive outcomes    Future DMSE  PRN    Program Status  Not Completed     Individualized Plan for Diabetes Self-Management Training:   Learning Objective:  Patient will have a greater understanding of diabetes self-management. Patient  education plan is to attend individual and/or group sessions per assessed needs and concerns.   Patient Instructions  Aim to eat balance meals and snacks, not too much carbohydrates and include protein when eating carbs. Continue to check your blood sugar when having low blood sugar symptoms and treat with 15 grams carbs. When you are able continue with your plans to get more exercise Consider checking your blood sugar 2 hours after a meal and keep in mind that the goal is to have your blood sugar 180 or below.  Expected Outcomes:  Demonstrated interest in learning. Expect positive outcomes  Education material provided: Treating Hypoglycemia, AND Diabetes: your take control guide  If problems or questions, patient to contact team via:  Phone  Future DSME appointment: PRN

## 2018-04-30 NOTE — Progress Notes (Signed)
LEANDRO, BERKOWITZ ([id number]) Visit Report for 04/28/2018 Arrival Information Details Patient Name: [name], [name] Date of Service: 04/28/2018 2:15 PM Medical Record Number: [id number] Patient Account Number: [id number] Date of Birth/Sex: Mar 02, 1961 (57 y.o. M) Treating RN: Ahmed Prima Primary Care Ashlin Hidalgo: [name] Other Clinician: Referring Sachin Ferencz: [name] Treating Tonnya Garbett/Extender: Tito Dine in Treatment: 5 Visit Information History Since Last Visit All ordered tests and consults were completed: No Patient Arrived: Cane Added or deleted any medications: No Arrival Time: 14:25 Any new allergies or adverse reactions: No Accompanied By: son Had a fall or experienced change in No Transfer Assistance: EasyP[employer] Patient activities of daily living that may affect Lift risk of falls: Patient Identification Verified: Yes Signs or symptoms of abuse/neglect since last visito No Secondary Verification Process Yes Hospitalized since last visit: No Completed: Implantable device outside of the clinic excluding No Patient Requires Transmission-Based No cellular tissue based products placed in the center Precautions: since last visit: Patient Has Alerts: Yes Has Dressing in Place as Prescribed: Yes Patient Alerts: DMII Has Compression in Place as Prescribed: Yes Pain Present Now: No Electronic Signature(s) Signed: 04/28/2018 4:20:38 PM By: Alric Quan Entered By: Alric Quan on 04/28/2018 14:25:46 [name] ([id number]) -------------------------------------------------------------------------------- Compression Therapy Details Patient Name: [name] Date of Service: 04/28/2018 2:15 PM Medical Record Number: [id number] Patient Account Number: [id number] Date of Birth/Sex: 1961-04-19 (57 y.o. M) Treating RN: Cornell Barman Primary Care Ammaar Encina: [name] Other Clinician: Referring Taneal Sonntag: [name] Treating Kaydra Borgen/Extender:  Tito Dine in Treatment: 5 Compression Therapy Performed for Wound Assessment: Wound #3 Right,Proximal,Anterior Lower Leg Performed By: Clinician Cornell Barman, RN Compression Type: Four Layer Pre Treatment ABI: 1.1 Post Procedure Diagnosis Same as Pre-procedure Electronic Signature(s) Signed: 04/28/2018 4:52:44 PM By: Gretta Cool, BSN, RN, CWS, Kim RN, BSN Entered By: Gretta Cool, BSN, RN, CWS, Kim on 04/28/2018 14:48:45 [name] ([id number]) -------------------------------------------------------------------------------- Encounter Discharge Information Details Patient Name: [name] Date of Service: 04/28/2018 2:15 PM Medical Record Number: [id number] Patient Account Number: [id number] Date of Birth/Sex: [birth year] (57 y.o. M) Treating RN: Montey Hora Primary Care Kaiden Pech: [name] Other Clinician: Referring Katalaya Beel: [name] Treating Shahira Fiske/Extender: Tito Dine in Treatment: 5 Encounter Discharge Information Items Discharge Condition: Stable Ambulatory Status: Ambulatory Discharge Destination: Home Transportation: Private Auto Accompanied By: son Schedule Follow-up Appointment: Yes Clinical Summary of Care: Electronic Signature(s) Signed: 04/28/2018 4:24:35 PM By: Montey Hora Entered By: Montey Hora on 04/28/2018 16:24:34 [name] ([id number]) -------------------------------------------------------------------------------- Lower Extremity Assessment Details Patient Name: [name] Date of Service: 04/28/2018 2:15 PM Medical Record Number: [id number] Patient Account Number: [id number] Date of Birth/Sex: Nov 06, 1961 (57 y.o. M) Treating RN: Ahmed Prima Primary Care Prescious Hurless: [name] Other Clinician: Referring Daven Pinckney: [name] Treating Deaaron Fulghum/Extender: Tito Dine in Treatment: 5 Edema Assessment Assessed: [Left: No] [Right: No] [Left: Edema] [Right: :] Calf Left: Right: Point of  Measurement: 36 cm From Medial Instep cm 49 cm Ankle Left: Right: Point of Measurement: 14 cm From Medial Instep cm 27.7 cm Vascular Assessment Pulses: Dorsalis Pedis Palpable: [Right:Yes] Posterior Tibial Extremity colors, hair growth, and conditions: Extremity Color: [Right:Normal] Temperature of Extremity: [Right:Warm] Capillary Refill: [Right:< 3 seconds] Toe Nail Assessment Left: Right: Thick: Yes Discolored: Yes Deformed: Yes Improper Length and Hygiene: No Electronic Signature(s) Signed: 04/28/2018 4:20:38 PM By: Alric Quan Entered By: Alric Quan on 04/28/2018 14:40:04 [name] ([id number]) -------------------------------------------------------------------------------- Multi Wound Chart Details Patient Name: [name] Date of Service: 04/28/2018  2:15 PM Medical Record Number: [id number] Patient Account Number: [id number] Date of Birth/Sex: 11-07-1961 (57 y.o. M) Treating RN: Cornell Barman Primary Care Gennett Garcia: [name] Other Clinician: Referring Korayma Hagwood: [name] Treating Rochell Puett/Extender: Tito Dine in Treatment: 5 Vital Signs Height(in): 75 Pulse(bpm): 1 Weight(lbs): 343 Blood Pressure(mmHg): 163/61 Body Mass Index(BMI): 43 Temperature(F): 98.0 Respiratory Rate 18 (breaths/min): Photos: [3:No Photos] [4:No Photos] [N/A:N/A] Wound Location: [3:Right Lower Leg - Anterior, Proximal] [4:Left Knee] [N/A:N/A] Wounding Event: [3:Gradually Appeared] [4:Trauma] [N/A:N/A] Primary Etiology: [3:Diabetic Wound/Ulcer of the Lower Extremity] [4:Trauma, Other] [N/A:N/A] Comorbid History: [3:Hypertension, Type II Diabetes, Neuropathy] [4:N/A] [N/A:N/A] Date Acquired: [3:04/14/2018] [4:04/07/2018] [N/A:N/A] Weeks of Treatment: [3:2] [4:2] [N/A:N/A] Wound Status: [3:Open] [4:Open] [N/A:N/A] Measurements L x W x D [3:0.5x0.5x0.1] [4:0x0x0] [N/A:N/A] (cm) Area (cm) : [3:0.196] [4:0] [N/A:N/A] Volume (cm) : [3:0.02] [4:0]  [N/A:N/A] % Reduction in Area: [3:0.00%] [4:100.00%] [N/A:N/A] % Reduction in Volume: [3:0.00%] [4:100.00%] [N/A:N/A] Classification: [3:Grade 1] [4:Full Thickness Without Exposed Support Structures] [N/A:N/A] Exudate Amount: [3:None Present] [4:N/A] [N/A:N/A] Wound Margin: [3:Distinct, outline attached] [4:N/A] [N/A:N/A] Granulation Amount: [3:Large (67-100%)] [4:N/A] [N/A:N/A] Granulation Quality: [3:Red] [4:N/A] [N/A:N/A] Necrotic Amount: [3:Small (1-33%)] [4:N/A] [N/A:N/A] Necrotic Tissue: [3:Eschar, Adherent Slough] [4:N/A] [N/A:N/A] Exposed Structures: [3:Fascia: No Fat Layer (Subcutaneous Tissue) Exposed: No Tendon: No Muscle: No Joint: No Bone: No] [4:N/A] [N/A:N/A] Epithelialization: [3:None] [4:N/A] [N/A:N/A] Periwound Skin Texture: [3:No Abnormalities Noted] [4:No Abnormalities Noted] [N/A:N/A] Periwound Skin Moisture: [3:No Abnormalities Noted] [4:No Abnormalities Noted] [N/A:N/A] Periwound Skin Color: [3:No Abnormalities Noted] [4:No Abnormalities Noted] [N/A:N/A] Temperature: [3:No Abnormality] [4:N/A] [N/A:N/A] Tenderness on Palpation: Yes No N/A Wound Preparation: Ulcer Cleansing: N/A N/A Rinsed/Irrigated with Saline, Other: soap and water Topical Anesthetic Applied: Other: lidocaine 4% Procedures Performed: Compression Therapy N/A N/A Treatment Notes Electronic Signature(s) Signed: 04/28/2018 4:50:03 PM By: Linton Ham MD Entered By: Linton Ham on 04/28/2018 14:50:43 [name] ([id number]) -------------------------------------------------------------------------------- Multi-Disciplinary Care Plan Details Patient Name: [name] Date of Service: 04/28/2018 2:15 PM Medical Record Number: [id number] Patient Account Number: [id number] Date of Birth/Sex: [birth year] (57 y.o. M) Treating RN: Cornell Barman Primary Care Harika Laidlaw: [name] Other Clinician: Referring Zaraya Delauder: [name] Treating Dhamar Gregory/Extender: Tito Dine in  Treatment: 5 Active Inactive ` Orientation to the Wound Care Program Nursing Diagnoses: Knowledge deficit related to the wound healing center program Goals: Patient/caregiver will verbalize understanding of the [place] Program Date Initiated: 03/24/2018 Target Resolution Date: 04/23/2018 Goal Status: Active Interventions: Provide education on orientation to the wound center Notes: ` Soft Tissue Infection Nursing Diagnoses: Potential for infection: soft tissue Goals: Patient will remain free of wound infection Date Initiated: 03/24/2018 Target Resolution Date: 04/23/2018 Goal Status: Active Interventions: Assess signs and symptoms of infection every visit Treatment Activities: Systemic antibiotics : 03/24/2018 Notes: ` Wound/Skin Impairment Nursing Diagnoses: Impaired tissue integrity Goals: Ulcer/skin breakdown will have a volume reduction of 80% by week 12 Date Initiated: 03/24/2018 Target Resolution Date: 07/24/2018 [name], [name] ([id number]) Goal Status: Active Ulcer/skin breakdown will heal within 14 weeks Date Initiated: 03/24/2018 Target Resolution Date: 07/24/2018 Goal Status: Active Interventions: Assess ulceration(s) every visit Treatment Activities: Referred to DME Nashonda Limberg for dressing supplies : 03/24/2018 Topical wound management initiated : 03/24/2018 Notes: Electronic Signature(s) Signed: 04/28/2018 4:52:44 PM By: Gretta Cool, BSN, RN, CWS, Kim RN, BSN Entered By: Gretta Cool, BSN, RN, CWS, Kim on 04/28/2018 14:46:37 [name] ([id number]) -------------------------------------------------------------------------------- Pain Assessment Details Patient Name: [name] Date of Service: 04/28/2018 2:15 PM Medical Record Number: [id number] Patient Account Number: [id number] Date of Birth/Sex: Sep 14, 1961 (  57 y.o. M) Treating RN: Ahmed Prima Primary Care Lenix Benoist: [name] Other Clinician: Referring Daymion Nazaire: [name] Treating  Tayjah Lobdell/Extender: Tito Dine in Treatment: 5 Active Problems Location of Pain Severity and Description of Pain Patient Has Paino No Site Locations Pain Management and Medication Current Pain Management: Electronic Signature(s) Signed: 04/28/2018 4:20:38 PM By: Alric Quan Entered By: Alric Quan on 04/28/2018 14:25:51 [name] ([id number]) -------------------------------------------------------------------------------- Patient/Caregiver Education Details Patient Name: [name] Date of Service: 04/28/2018 2:15 PM Medical Record Number: [id number] Patient Account Number: [id number] Date of Birth/Gender: [birth year] (57 y.o. M) Treating RN: Montey Hora Primary Care Physician: [name] Other Clinician: Referring Physician: [name] Treating Physician/Extender: Tito Dine in Treatment: 5 Education Assessment Education Provided To: Patient and Caregiver Education Topics Provided Venous: Handouts: Other: need for compression Methods: Explain/Verbal Responses: State content correctly Electronic Signature(s) Signed: 04/28/2018 4:31:04 PM By: Montey Hora Entered By: Montey Hora on 04/28/2018 16:24:58 [name] ([id number]) -------------------------------------------------------------------------------- Wound Assessment Details Patient Name: [name] Date of Service: 04/28/2018 2:15 PM Medical Record Number: [id number] Patient Account Number: [id number] Date of Birth/Sex: [birth year] (57 y.o. M) Treating RN: Ahmed Prima Primary Care Calysta Craigo: [name] Other Clinician: Referring Lakesia Dahle: [name] Treating Emila Steinhauser/Extender: Tito Dine in Treatment: 5 Wound Status Wound Number: 3 Primary Etiology: Diabetic Wound/Ulcer of the Lower Extremity Wound Location: Right Lower Leg - Anterior, Proximal Wound Status: Open Wounding Event: Gradually Appeared Comorbid Hypertension, Type II  Diabetes, Date Acquired: 04/14/2018 History: Neuropathy Weeks Of Treatment: 2 Clustered Wound: No Wound Measurements Length: (cm) 0.5 Width: (cm) 0.5 Depth: (cm) 0.1 Area: (cm) 0.196 Volume: (cm) 0.02 % Reduction in Area: 0% % Reduction in Volume: 0% Epithelialization: None Tunneling: No Undermining: No Wound Description Classification: Grade 1 Wound Margin: Distinct, outline attached Exudate Amount: None Present Foul Odor After Cleansing: No Slough/Fibrino Yes Wound Bed Granulation Amount: Large (67-100%) Exposed Structure Granulation Quality: Red Fascia Exposed: No Necrotic Amount: Small (1-33%) Fat Layer (Subcutaneous Tissue) Exposed: No Necrotic Quality: Eschar, Adherent Slough Tendon Exposed: No Muscle Exposed: No Joint Exposed: No Bone Exposed: No Periwound Skin Texture Texture Color No Abnormalities Noted: No No Abnormalities Noted: No Moisture Temperature / Pain No Abnormalities Noted: No Temperature: No Abnormality Tenderness on Palpation: Yes Wound Preparation Ulcer Cleansing: Rinsed/Irrigated with Saline, Other: soap and water, Topical Anesthetic Applied: Other: lidocaine 4%, Treatment Notes Wound #3 (Right, Proximal, Anterior Lower Leg) 1. Cleansed with: Cleanse wound with antibacterial soap and water [name], Christopher K. ([id number]) 4. Dressing Applied: Other dressing (specify in notes) 5. Secondary Dressing Applied Dry Gauze 7. Secured with 4-Layer Compression System - Right Lower Extremity Notes [employer] Electronic Signature(s) Signed: 04/28/2018 4:20:38 PM By: Alric Quan Entered By: Alric Quan on 04/28/2018 14:38:37 [name] ([id number]) -------------------------------------------------------------------------------- Wound Assessment Details Patient Name: [name] Date of Service: 04/28/2018 2:15 PM Medical Record Number: [id number] Patient Account Number: [id number] Date of Birth/Sex: [birth year] (57 y.o.  M) Treating RN: Ahmed Prima Primary Care Araseli Sherry: [name] Other Clinician: Referring Raquan Iannone: [name] Treating Jaimy Kliethermes/Extender: Tito Dine in Treatment: 5 Wound Status Wound Number: 4 Primary Etiology: Trauma, Other Wound Location: Left Knee Wound Status: Open Wounding Event: Trauma Date Acquired: 04/07/2018 Weeks Of Treatment: 2 Clustered Wound: No Wound Measurements Length: (cm) 0 Width: (cm) 0 Depth: (cm) 0 Area: (cm) 0 Volume: (cm) 0 % Reduction in Area: 100% % Reduction in Volume: 100% Wound Description Full Thickness Without Exposed Support Classification: Structures Periwound Skin Texture  Texture Color No Abnormalities Noted: No No Abnormalities Noted: No Moisture No Abnormalities Noted: No Electronic Signature(s) Signed: 04/28/2018 4:20:38 PM By: Alric Quan Entered By: Alric Quan on 04/28/2018 14:36:32 [name] ([id number]) -------------------------------------------------------------------------------- Vitals Details Patient Name: [name] Date of Service: 04/28/2018 2:15 PM Medical Record Number: [id number] Patient Account Number: [id number] Date of Birth/Sex: May 05, 1961 (57 y.o. M) Treating RN: Ahmed Prima Primary Care Kristena Wilhelmi: [name] Other Clinician: Referring Jaydy Fitzhenry: [name] Treating Christian Treadway/Extender: Tito Dine in Treatment: 5 Vital Signs Time Taken: 14:31 Temperature (F): 98.0 Height (in): 75 Pulse (bpm): 84 Weight (lbs): 343 Respiratory Rate (breaths/min): 18 Body Mass Index (BMI): 42.9 Blood Pressure (mmHg): 163/61 Reference Range: 80 - 120 mg / dl Electronic Signature(s) Signed: 04/28/2018 4:20:38 PM By: Alric Quan Entered By: Alric Quan on 04/28/2018 14:32:39

## 2018-04-30 NOTE — Progress Notes (Signed)
[name] ([id number]) Visit Report for 04/28/2018 HPI Details Patient Name: [name], [name] Date of Service: 04/28/2018 2:15 PM Medical Record Number: [id number] Patient Account Number: [id number] Date of Birth/Sex: Jul 03, 1961 (57 y.o. M) Treating RN: Cornell Barman Primary Care Provider: [name] Other Clinician: Referring Provider: [name] Treating Provider/Extender: Tito Dine in Treatment: 5 History of Present Illness HPI Description: 03/24/18; patient is a 57 year old type II diabetic on oral agents. He tells me he has right foot drop secondary to previous back surgery had some years ago. He wears a brace on the right leg and walks with a quad cane. He had the brace adjusted in December/18 and apparently did not fit properly and he developed wounds predominantly on the posterior right calf where the brace rubbed his leg. The patient describes this as acute he noticed it right away and was able to have the brace readjusted. He subsequently psoas had some wounds on the anterior right leg which she says are tape abrasion injuries. His primary doctor is Dr. Rex Kras at [facility]. It looks as though Dr. Rex Kras saw this initially in January. Started him on cephalexin. Patient was seen again on 03/19/17 without much improvement. He has been using Neosporin to the wound areas. He was started on Augmentin on 03/19/18. The patient does not have a wound history. He has a history of a fractured left patella in 2017. Chronic foot drop on the right apparently related to back surgery. Hyperlipidemia and essential hypertension. 03/24/18; the patient still has a scattering of wounds on the anterior medial extending posteriorly in the right leg. The advertising as this is being secondary to abrasion injury although there is a lot of irritation here and one would have to wonder whether this represents chronic venous inflammation/stasis dermatitis. In any case of that's true it was not  well recognized. 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 He is completing Augmentin at this point I don't think he needs additional antibiotics. we applied silver alginate under 4-layer compression which the patient tolerated well 04/07/18; the patient's major wound areas on the medial right leg. This is in the setting of chronic venous insufficiency/stasis dermatitis and lymphedema. We applied silver alginate under for layer compression. He's been scratching at this through the stop under the stockings. He has superficial wounds just below the anterior stocking line. He also fell and the [employer] parking lot superficial "road burn" on the left knee and left elbow. We did not the define these areas 04/14/18; since wounds on the right leg look better. He still requires debridement especially on the lower medial calf area we've been using silver alginate under compressionat 4 layer. One of the excoriations from the fall last week had a greenish drainage per her intake nurse. This is on the left knee. 04/21/18; wounds on his right leg continued to improve some of them are closed. No debridement is required on the right. The large area of excoriation over the left patella from a fall last week has thick surface eschar which required debridement today but I think is on his way to healing. The patient had venous reflux studies. He does not have either deep or superficial thrombosis in either lower extremity. His reflux evaluation on the right did not comment on any major reflux in the right thigh or right distal superficial veins. He had more abnormalities on the left. These reports are getting more difficult to interpret they didn't comment on anything that would require an  ablation in the superficial veins below the knee. I think he will need ongoing compression. 04/28/18;the patient has one small superficial area on the right medial lower leg. The area on the left knee has healed. He  has 20-30 below-knee stockings. He has very significant chronic venous insufficiency and lymphedema. We've been using silver collagen Electronic Signature(s) Signed: 04/28/2018 4:50:03 PM By: Linton Ham MD Entered By: Linton Ham on 04/28/2018 14:59:49 [name] ([id number]) -------------------------------------------------------------------------------- Physical Exam Details Patient Name: [name] Date of Service: 04/28/2018 2:15 PM Medical Record Number: [id number] Patient Account Number: [id number] Date of Birth/Sex: 05-Sep-1961 (57 y.o. M) Treating RN: Cornell Barman Primary Care Provider: [name] Other Clinician: Referring Provider: [name] Treating Provider/Extender: Tito Dine in Treatment: 5 Constitutional Patient is hypertensive.. Pulse regular and within target range for patient.Marland Kitchen Respirations regular, non-labored and within target range.. Temperature is normal and within the target range for the patient.Marland Kitchen appears in no distress. Eyes Conjunctivae clear. No discharge. Respiratory Respiratory effort is easy and symmetric bilaterally. Rate is normal at rest and on room air.. Cardiovascular Pedal pulses palpable and strong bilaterally.. Edema present in both extremities.chronic venous insufficiency with secondary lymphedema. Lymphatic none palpable in the popliteal or inguinal area. Integumentary (Hair, Skin) no significant systemic skin issues. Chronic venous insufficiency and secondary lymphedema in the lower legs. Notes when exam; the area on his right anterior leg is closed. He only has one small open area remaining on the right medial lower calf and this is superficial and epithelializing and I think should be closed next week. The degree of inflammation in this area is considerably better. oThe area on the left anterior knee is totally healed Electronic Signature(s) Signed: 04/28/2018 4:50:03 PM By: Linton Ham MD Entered By:  Linton Ham on 04/28/2018 15:01:55 [name] ([id number]) -------------------------------------------------------------------------------- Physician Orders Details Patient Name: [name] Date of Service: 04/28/2018 2:15 PM Medical Record Number: [id number] Patient Account Number: [id number] Date of Birth/Sex: 1961/08/23 (57 y.o. M) Treating RN: Cornell Barman Primary Care Provider: [name] Other Clinician: Referring Provider: [name] Treating Provider/Extender: Tito Dine in Treatment: 5 Verbal / Phone Orders: No Diagnosis Coding Wound Cleansing Wound #3 Right,Proximal,Anterior Lower Leg o Cleanse wound with mild soap and water Anesthetic (add to Medication List) Wound #3 Right,Proximal,Anterior Lower Leg o Topical Lidocaine 4% cream applied to wound bed prior to debridement (In Clinic Only). Skin Barriers/Peri-Wound Care Wound #3 Right,Proximal,Anterior Lower Leg o Triamcinolone Acetonide Ointment (TAC) Primary Wound Dressing Wound #3 Right,Proximal,Anterior Lower Leg o Silver Alginate Secondary Dressing Wound #3 Right,Proximal,Anterior Lower Leg o ABD pad o Other - Absorptive dressing Dressing Change Frequency Wound #3 Right,Proximal,Anterior Lower Leg o Change dressing every week Follow-up Appointments Wound #3 Right,Proximal,Anterior Lower Leg o Return Appointment in 1 week. o Nurse Visit as needed Edema Control Wound #3 Right,Proximal,Anterior Lower Leg o 4-Layer Compression System - Right Lower Extremity - Unna paste to anchor o Elevate legs to the level of the heart and pump ankles as often as possible Additional Orders / Instructions Wound #3 Right,Proximal,Anterior Lower Leg o Activity as tolerated BARRET, ESQUIVEL ([id number]) Electronic Signature(s) Signed: 04/28/2018 4:50:03 PM By: Linton Ham MD Signed: 04/28/2018 4:52:44 PM By: Gretta Cool, BSN, RN, CWS, Kim RN, BSN Entered By: Gretta Cool, BSN, RN, CWS, Kim  on 04/28/2018 14:49:18 [name] ([id number]) -------------------------------------------------------------------------------- Problem List Details Patient Name: [name] Date of Service: 04/28/2018 2:15 PM Medical Record Number: [id number] Patient Account Number: [id number] Date of Birth/Sex:  1961-02-22 (57 y.o. 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 I87.331 Chronic venous hypertension (idiopathic) with ulcer and 03/24/2018 Yes inflammation of right lower extremity E11.622 Type 2 diabetes mellitus with other skin ulcer 03/24/2018 Yes I89.0 Lymphedema, not elsewhere classified 03/24/2018 Yes S81.012D Laceration without foreign body, left knee, subsequent 04/14/2018 Yes encounter Inactive Problems Resolved Problems Electronic Signature(s) Signed: 04/28/2018 4:50:03 PM By: Linton Ham MD Entered By: Linton Ham on 04/28/2018 14:49:50 [name] ([id number]) -------------------------------------------------------------------------------- Progress Note Details Patient Name: [name] Date of Service: 04/28/2018 2:15 PM Medical Record Number: [id number] Patient Account Number: [id number] Date of Birth/Sex: 1961-05-28 (57 y.o. M) Treating RN: Cornell Barman Primary Care Provider: [name] Other Clinician: Referring Provider: [name] Treating Provider/Extender: Tito Dine in Treatment: 5 Subjective History of Present Illness (HPI) 03/24/18; patient is a 57 year old type II diabetic on oral agents. He tells me he has right foot drop secondary to previous back surgery had some years ago. He wears a brace on the right  leg and walks with a quad cane. He had the brace adjusted in December/18 and apparently did not fit properly and he developed wounds predominantly on the posterior right calf where the brace rubbed his leg. The patient describes this as acute he noticed it right away and was able to have the brace readjusted. He subsequently psoas had some wounds on the anterior right leg which she says are tape abrasion injuries. His primary doctor is Dr. Rex Kras at [facility]. It looks as though Dr. Rex Kras saw this initially in January. Started him on cephalexin. Patient was seen again on 03/19/17 without much improvement. He has been using Neosporin to the wound areas. He was started on Augmentin on 03/19/18. The patient does not have a wound history. He has a history of a fractured left patella in 2017. Chronic foot drop on the right apparently related to back surgery. Hyperlipidemia and essential hypertension. 03/24/18; the patient still has a scattering of wounds on the anterior medial extending posteriorly in the right leg. The advertising as this is being secondary to abrasion injury although there is a lot of irritation here and one would have to wonder whether this represents chronic venous inflammation/stasis dermatitis. In any case of that's true it was not well recognized. 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 He is completing Augmentin at this point I don't think he needs additional antibiotics. we applied silver alginate under 4-layer compression which the patient tolerated well 04/07/18; the patient's major wound areas on the medial right leg. This is in the setting of chronic venous insufficiency/stasis dermatitis and lymphedema. We applied silver alginate under for layer compression. He's been scratching at this through the stop under the stockings. He has superficial wounds just below the anterior stocking line. He also fell and the [employer] parking lot superficial "road burn" on  the left knee and left elbow. We did not the define these areas 04/14/18; since wounds on the right leg look better. He still requires debridement especially on the lower medial calf area we've been using silver alginate under compressionat 4 layer. One of the excoriations from the fall last week had a greenish drainage per her intake nurse. This is on the left  knee. 04/21/18; wounds on his right leg continued to improve some of them are closed. No debridement is required on the right. The large area of excoriation over the left patella from a fall last week has thick surface eschar which required debridement today but I think is on his way to healing. The patient had venous reflux studies. He does not have either deep or superficial thrombosis in either lower extremity. His reflux evaluation on the right did not comment on any major reflux in the right thigh or right distal superficial veins. He had more abnormalities on the left. These reports are getting more difficult to interpret they didn't comment on anything that would require an ablation in the superficial veins below the knee. I think he will need ongoing compression. 04/28/18;the patient has one small superficial area on the right medial lower leg. The area on the left knee has healed. He has 20-30 below-knee stockings. He has very significant chronic venous insufficiency and lymphedema. We've been using silver collagen Objective Constitutional RAMONTE, MENA. ([id number]) Patient is hypertensive.. Pulse regular and within target range for patient.Marland Kitchen Respirations regular, non-labored and within target range.. Temperature is normal and within the target range for the patient.Marland Kitchen appears in no distress. Vitals Time Taken: 2:31 PM, Height: 75 in, Weight: 343 lbs, BMI: 42.9, Temperature: 98.0 F, Pulse: 84 bpm, Respiratory Rate: 18 breaths/min, Blood Pressure: 163/61 mmHg. Eyes Conjunctivae clear. No discharge. Respiratory Respiratory effort  is easy and symmetric bilaterally. Rate is normal at rest and on room air.. Cardiovascular Pedal pulses palpable and strong bilaterally.. Edema present in both extremities.chronic venous insufficiency with secondary lymphedema. Lymphatic none palpable in the popliteal or inguinal area. General Notes: when exam; the area on his right anterior leg is closed. He only has one small open area remaining on the right medial lower calf and this is superficial and epithelializing and I think should be closed next week. The degree of inflammation in this area is considerably better. The area on the left anterior knee is totally healed Integumentary (Hair, Skin) no significant systemic skin issues. Chronic venous insufficiency and secondary lymphedema in the lower legs. Wound #3 status is Open. Original cause of wound was Gradually Appeared. The wound is located on the Right,Proximal,Anterior Lower Leg. The wound measures 0.5cm length x 0.5cm width x 0.1cm depth; 0.196cm^2 area and 0.02cm^3 volume. There is no tunneling or undermining noted. There is a none present amount of drainage noted. The wound margin is distinct with the outline attached to the wound base. There is large (67-100%) red granulation within the wound bed. There is a small (1-33%) amount of necrotic tissue within the wound bed including Eschar and Adherent Slough. Periwound temperature was noted as No Abnormality. The periwound has tenderness on palpation. Wound #4 status is Open. Original cause of wound was Trauma. The wound is located on the Left Knee. The wound measures 0cm length x 0cm width x 0cm depth; 0cm^2 area and 0cm^3 volume. Assessment Active Problems ICD-10 L97.211 - Non-pressure chronic ulcer of right calf limited to breakdown of skin I87.331 - Chronic venous hypertension (idiopathic) with ulcer and inflammation of right lower extremity E11.622 - Type 2 diabetes mellitus with other skin ulcer I89.0 - Lymphedema, not  elsewhere classified S81.012D - Laceration without foreign body, left knee, subsequent encounter Procedures KYSHAUN, BARNETTE. ([id number]) Wound #3 Pre-procedure diagnosis of Wound #3 is a Diabetic Wound/Ulcer of the Lower Extremity located on the Right,Proximal,Anterior Lower Leg . There was a Four Layer Compression  Therapy Procedure with a pre-treatment ABI of 1.1 by Cornell Barman, RN. Post procedure Diagnosis Wound #3: Same as Pre-Procedure Plan Wound Cleansing: Wound #3 Right,Proximal,Anterior Lower Leg: Cleanse wound with mild soap and water Anesthetic (add to Medication List): Wound #3 Right,Proximal,Anterior Lower Leg: Topical Lidocaine 4% cream applied to wound bed prior to debridement (In Clinic Only). Skin Barriers/Peri-Wound Care: Wound #3 Right,Proximal,Anterior Lower Leg: Triamcinolone Acetonide Ointment (TAC) Primary Wound Dressing: Wound #3 Right,Proximal,Anterior Lower Leg: Silver Alginate Secondary Dressing: Wound #3 Right,Proximal,Anterior Lower Leg: ABD pad Other - Absorptive dressing Dressing Change Frequency: Wound #3 Right,Proximal,Anterior Lower Leg: Change dressing every week Follow-up Appointments: Wound #3 Right,Proximal,Anterior Lower Leg: Return Appointment in 1 week. Nurse Visit as needed Edema Control: Wound #3 Right,Proximal,Anterior Lower Leg: 4-Layer Compression System - Right Lower Extremity - Unna paste to anchor Elevate legs to the level of the heart and pump ankles as often as possible Additional Orders / Instructions: Wound #3 Right,Proximal,Anterior Lower Leg: Activity as tolerated #1 the patient should be healed next week #2 out of an abundance of caution we wrapped him again this week.silver alginate to the wound under 4 layer compression Electronic Signature(s) Signed: 04/28/2018 4:50:03 PM By: Linton Ham MD Entered By: Linton Ham on 04/28/2018 15:02:51 EUGUNE, SINE ([id number]) HEZZIE, KARIM  ([id number]) -------------------------------------------------------------------------------- SuperBill Details Patient Name: [name] Date of Service: 04/28/2018 Medical Record Number: [id number] Patient Account Number: [id number] Date of Birth/Sex: 10/31/1961 (57 y.o. M) Treating RN: Cornell Barman Primary Care Provider: [name] Other Clinician: Referring Provider: [name] Treating Provider/Extender: Tito Dine in Treatment: 5 Diagnosis Coding ICD-10 Codes Code Description [phone] Non-pressure chronic ulcer of right calf limited to breakdown of skin I87.331 Chronic venous hypertension (idiopathic) with ulcer and inflammation of right lower extremity E11.622 Type 2 diabetes mellitus with other skin ulcer I89.0 Lymphedema, not elsewhere classified S81.012D Laceration without foreign body, left knee, subsequent encounter Facility Procedures CPT4 Code Description: [id number] (Facility Use Only) [phone] - [place] RT LEG Modifier: Quantity: 1 Physician Procedures CPT4 Code: [id number] Description: 94503 - WC PHYS LEVEL 3 - EST PT ICD-10 Diagnosis Description L97.211 Non-pressure chronic ulcer of right calf limited to breakdo Modifier: wn of skin Quantity: 1 Electronic Signature(s) Signed: 04/28/2018 4:50:03 PM By: Linton Ham MD Entered By: Linton Ham on 04/28/2018 15:03:30

## 2018-05-03 DIAGNOSIS — E119 Type 2 diabetes mellitus without complications: Secondary | ICD-10-CM | POA: Insufficient documentation

## 2018-05-05 ENCOUNTER — Encounter: Payer: Medicare HMO | Admitting: Internal Medicine

## 2018-05-05 DIAGNOSIS — E11622 Type 2 diabetes mellitus with other skin ulcer: Secondary | ICD-10-CM | POA: Diagnosis not present

## 2018-05-05 DIAGNOSIS — S81801A Unspecified open wound, right lower leg, initial encounter: Secondary | ICD-10-CM | POA: Diagnosis not present

## 2018-05-05 DIAGNOSIS — L97819 Non-pressure chronic ulcer of other part of right lower leg with unspecified severity: Secondary | ICD-10-CM | POA: Diagnosis not present

## 2018-05-05 DIAGNOSIS — S81012A Laceration without foreign body, left knee, initial encounter: Secondary | ICD-10-CM | POA: Diagnosis not present

## 2018-05-05 DIAGNOSIS — L97211 Non-pressure chronic ulcer of right calf limited to breakdown of skin: Secondary | ICD-10-CM | POA: Diagnosis not present

## 2018-05-05 DIAGNOSIS — I890 Lymphedema, not elsewhere classified: Secondary | ICD-10-CM | POA: Diagnosis not present

## 2018-05-05 DIAGNOSIS — E119 Type 2 diabetes mellitus without complications: Secondary | ICD-10-CM | POA: Diagnosis not present

## 2018-05-05 DIAGNOSIS — I87331 Chronic venous hypertension (idiopathic) with ulcer and inflammation of right lower extremity: Secondary | ICD-10-CM | POA: Diagnosis not present

## 2018-05-05 DIAGNOSIS — W19XXXA Unspecified fall, initial encounter: Secondary | ICD-10-CM | POA: Diagnosis not present

## 2018-05-07 NOTE — Progress Notes (Signed)
[name] ([id number]) Visit Report for 05/05/2018 HPI Details Patient Name: [name], [name] Date of Service: 05/05/2018 9:30 AM Medical Record Number: [id number] Patient Account Number: [id number] Date of Birth/Sex: Feb 18, 1961 (57 y.o. M) Treating RN: Cornell Barman Primary Care Provider: [name] Other Clinician: Referring Provider: [name] Treating Provider/Extender: Tito Dine in Treatment: 6 History of Present Illness HPI Description: 03/24/18; patient is a 57 year old type II diabetic on oral agents. He tells me he has right foot drop secondary to previous back surgery had some years ago. He wears a brace on the right leg and walks with a quad cane. He had the brace adjusted in December/18 and apparently did not fit properly and he developed wounds predominantly on the posterior right calf where the brace rubbed his leg. The patient describes this as acute he noticed it right away and was able to have the brace readjusted. He subsequently psoas had some wounds on the anterior right leg which she says are tape abrasion injuries. His primary doctor is Dr. Rex Kras at [facility]. It looks as though Dr. Rex Kras saw this initially in January. Started him on cephalexin. Patient was seen again on 03/19/17 without much improvement. He has been using Neosporin to the wound areas. He was started on Augmentin on 03/19/18. The patient does not have a wound history. He has a history of a fractured left patella in 2017. Chronic foot drop on the right apparently related to back surgery. Hyperlipidemia and essential hypertension. 03/24/18; the patient still has a scattering of wounds on the anterior medial extending posteriorly in the right leg. The advertising as this is being secondary to abrasion injury although there is a lot of irritation here and one would have to wonder whether this represents chronic venous inflammation/stasis dermatitis. In any case of that's true it was not  well recognized. 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 He is completing Augmentin at this point I don't think he needs additional antibiotics. we applied silver alginate under 4-layer compression which the patient tolerated well 04/07/18; the patient's major wound areas on the medial right leg. This is in the setting of chronic venous insufficiency/stasis dermatitis and lymphedema. We applied silver alginate under for layer compression. He's been scratching at this through the stop under the stockings. He has superficial wounds just below the anterior stocking line. He also fell and the [employer] parking lot superficial "road burn" on the left knee and left elbow. We did not the define these areas 04/14/18; since wounds on the right leg look better. He still requires debridement especially on the lower medial calf area we've been using silver alginate under compressionat 4 layer. One of the excoriations from the fall last week had a greenish drainage per her intake nurse. This is on the left knee. 04/21/18; wounds on his right leg continued to improve some of them are closed. No debridement is required on the right. The large area of excoriation over the left patella from a fall last week has thick surface eschar which required debridement today but I think is on his way to healing. The patient had venous reflux studies. He does not have either deep or superficial thrombosis in either lower extremity. His reflux evaluation on the right did not comment on any major reflux in the right thigh or right distal superficial veins. He had more abnormalities on the left. These reports are getting more difficult to interpret they didn't comment on anything that would require an  ablation in the superficial veins below the knee. I think he will need ongoing compression. 04/28/18;the patient has one small superficial area on the right medial lower leg. The area on the left knee has healed. He  has 20-30 below-knee stockings. He has very significant chronic venous insufficiency and lymphedema. We've been using silver collagen 05/05/18; all the wounds on the right medial lower leg are closed. He had another fall fortunately no major issues. He has 20/30 below-the-knee stockings which will put him in bilaterally. He has significant chronic venous insufficiency and lymphedema Electronic Signature(s) Signed: 05/05/2018 5:10:30 PM By: Linton Ham MD Entered By: Linton Ham on 05/05/2018 11:45:02 KASHMIR, LYSAGHT ([id number]) BERNARD, SLAYDEN ([id number]) -------------------------------------------------------------------------------- Physical Exam Details Patient Name: [name] Date of Service: 05/05/2018 9:30 AM Medical Record Number: [id number] Patient Account Number: [id number] Date of Birth/Sex: 01-Sep-1961 (57 y.o. M) Treating RN: Cornell Barman Primary Care Provider: [name] Other Clinician: Referring Provider: [name] Treating Provider/Extender: Tito Dine in Treatment: 6 Constitutional Patient is hypertensive.. Pulse regular and within target range for patient.Marland Kitchen Respirations regular, non-labored and within target range.. Temperature is normal and within the target range for the patient.Marland Kitchen appears in no distress. Eyes Conjunctivae clear. No discharge. Respiratory Respiratory effort is easy and symmetric bilaterally. Rate is normal at rest and on room air.. Cardiovascular pedal pulses palpable on the right. Musculoskeletal the patient walks with a brace on his left leg. Watching him leave the clinic today this clearly does not fit. He'll need to have this tightened over the brace changed. Psychiatric No evidence of depression, anxiety, or agitation. Calm, cooperative, and communicative. Appropriate interactions and affect.. Notes wound exam; the area on his right anterior leg is closed right medial leg also closed. There are no open areas  here. Electronic Signature(s) Signed: 05/05/2018 5:10:30 PM By: Linton Ham MD Entered By: Linton Ham on 05/05/2018 12:03:29 [name] ([id number]) -------------------------------------------------------------------------------- Physician Orders Details Patient Name: [name] Date of Service: 05/05/2018 9:30 AM Medical Record Number: [id number] Patient Account Number: [id number] Date of Birth/Sex: Apr 15, 1961 (57 y.o. M) Treating RN: Cornell Barman Primary Care Provider: [name] Other Clinician: Referring Provider: [name] Treating Provider/Extender: Tito Dine in Treatment: 6 Verbal / Phone Orders: No Diagnosis Coding Discharge From [facility] Wound #3 Right,Proximal,Anterior Lower Leg o Discharge from [place] - discharge treatment complete. Electronic Signature(s) Signed: 05/05/2018 5:10:30 PM By: Linton Ham MD Signed: 05/05/2018 5:19:09 PM By: Gretta Cool, BSN, RN, CWS, Kim RN, BSN Entered By: Gretta Cool, BSN, RN, CWS, Kim on 05/05/2018 10:36:52 DAX, MURGUIA ([id number]) -------------------------------------------------------------------------------- Problem List Details Patient Name: [name] Date of Service: 05/05/2018 9:30 AM Medical Record Number: [id number] Patient Account Number: [id number] Date of Birth/Sex: Jul 21, 1961 (57 y.o. 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 I87.331 Chronic venous hypertension (idiopathic) with ulcer and 03/24/2018 Yes inflammation of right lower extremity E11.622 Type 2 diabetes mellitus with other skin ulcer 03/24/2018 Yes I89.0 Lymphedema, not elsewhere  classified 03/24/2018 Yes S81.012D Laceration without foreign body, left knee, subsequent 04/14/2018 Yes encounter Inactive Problems Resolved Problems Electronic Signature(s) Signed: 05/05/2018 5:10:30 PM By: Linton Ham MD Entered By: Linton Ham on 05/05/2018 11:43:02 Wyatt, [name] ([id number]) -------------------------------------------------------------------------------- Progress Note Details  Patient Name: DOW, BLAHNIK Date of Service: 05/05/2018 9:30 AM Medical Record Number: [id number] Patient Account Number: [id number] Date of Birth/Sex: 11/05/1961 (57 y.o. M) Treating RN: Cornell Barman Primary Care Provider: [name] Other Clinician: Referring Provider: [name] Treating Provider/Extender: Tito Dine in Treatment: 6 Subjective History of Present Illness (HPI) 03/24/18; patient is a 57 year old type II diabetic on oral agents. He tells me he has right foot drop secondary to previous back surgery had some years ago. He wears a brace on the right leg and walks with a quad cane. He had the brace adjusted in December/18 and apparently did not fit properly and he developed wounds predominantly on the posterior right calf where the brace rubbed his leg. The patient describes this as acute he noticed it right away and was able to have the brace readjusted. He subsequently psoas had some wounds on the anterior right leg which she says are tape abrasion injuries. His primary doctor is Dr. Rex Kras at [facility]. It looks as though Dr. Rex Kras saw this initially in January. Started him on cephalexin. Patient was seen again on 03/19/17 without much improvement. He has been using Neosporin to the wound areas. He was started on Augmentin on 03/19/18. The patient does not have a wound history. He has a history of a fractured left patella in 2017. Chronic foot drop on the right apparently related to back surgery. Hyperlipidemia and essential hypertension. 03/24/18; the  patient still has a scattering of wounds on the anterior medial extending posteriorly in the right leg. The advertising as this is being secondary to abrasion injury although there is a lot of irritation here and one would have to wonder whether this represents chronic venous inflammation/stasis dermatitis. In any case of that's true it was not well recognized. 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 He is completing Augmentin at this point I don't think he needs additional antibiotics. we applied silver alginate under 4-layer compression which the patient tolerated well 04/07/18; the patient's major wound areas on the medial right leg. This is in the setting of chronic venous insufficiency/stasis dermatitis and lymphedema. We applied silver alginate under for layer compression. He's been scratching at this through the stop under the stockings. He has superficial wounds just below the anterior stocking line. He also fell and the [employer] parking lot superficial "road burn" on the left knee and left elbow. We did not the define these areas 04/14/18; since wounds on the right leg look better. He still requires debridement especially on the lower medial calf area we've been using silver alginate under compressionat 4 layer. One of the excoriations from the fall last week had a greenish drainage per her intake nurse. This is on the left knee. 04/21/18; wounds on his right leg continued to improve some of them are closed. No debridement is required on the right. The large area of excoriation over the left patella from a fall last week has thick surface eschar which required debridement today but I think is on his way to healing. The patient had venous reflux studies. He does not have either deep or superficial thrombosis in either lower extremity. His reflux evaluation on the right did not comment on any major reflux in the right thigh or right distal superficial veins. He had more  abnormalities on the left. These reports are getting more difficult to interpret they didn't comment on anything that would require an ablation in the superficial veins below the knee. I  think he will need ongoing compression. 04/28/18;the patient has one small superficial area on the right medial lower leg. The area on the left knee has healed. He has 20-30 below-knee stockings. He has very significant chronic venous insufficiency and lymphedema. We've been using silver collagen 05/05/18; all the wounds on the right medial lower leg are closed. He had another fall fortunately no major issues. He has 20/30 below-the-knee stockings which will put him in bilaterally. He has significant chronic venous insufficiency and lymphedema OJAS, COONE K. ([id number]) Objective Constitutional Patient is hypertensive.. Pulse regular and within target range for patient.Marland Kitchen Respirations regular, non-labored and within target range.. Temperature is normal and within the target range for the patient.Marland Kitchen appears in no distress. Vitals Time Taken: 9:55 AM, Height: 75 in, Weight: 343 lbs, BMI: 42.9, Temperature: 98.2 F, Pulse: 68 bpm, Respiratory Rate: 18 breaths/min, Blood Pressure: 145/83 mmHg. Eyes Conjunctivae clear. No discharge. Respiratory Respiratory effort is easy and symmetric bilaterally. Rate is normal at rest and on room air.. Cardiovascular pedal pulses palpable on the right. Musculoskeletal the patient walks with a brace on his left leg. Watching him leave the clinic today this clearly does not fit. He'll need to have this tightened over the brace changed. Psychiatric No evidence of depression, anxiety, or agitation. Calm, cooperative, and communicative. Appropriate interactions and affect.. General Notes: wound exam; the area on his right anterior leg is closed right medial leg also closed. There are no open areas here. Integumentary (Hair, Skin) Wound #3 status is Open. Original cause of wound  was Gradually Appeared. The wound is located on the Right,Proximal,Anterior Lower Leg. The wound measures 0cm length x 0cm width x 0cm depth; 0cm^2 area and 0cm^3 volume. Assessment Active Problems ICD-10 L97.211 - Non-pressure chronic ulcer of right calf limited to breakdown of skin I87.331 - Chronic venous hypertension (idiopathic) with ulcer and inflammation of right lower extremity E11.622 - Type 2 diabetes mellitus with other skin ulcer I89.0 - Lymphedema, not elsewhere classified S81.012D - Laceration without foreign body, left knee, subsequent encounter Plan Discharge From [facility] Services: VIVEK, GREALISH ([id number]) Wound #3 Right,Proximal,Anterior Lower Leg: Discharge from [place] - discharge treatment complete. #1 the patient can be discharged to his own pressure stockings #2 advised to have his brace adjusted, I'm not sure the current braces doing anything at all Electronic Signature(s) Signed: 05/05/2018 5:10:30 PM By: Linton Ham MD Entered By: Linton Ham on 05/05/2018 12:09:35 [name] ([id number]) -------------------------------------------------------------------------------- SuperBill Details Patient Name: [name] Date of Service: 05/05/2018 Medical Record Number: [id number] Patient Account Number: [id number] Date of Birth/Sex: 1961/04/24 (57 y.o. M) Treating RN: Cornell Barman Primary Care Provider: [name] Other Clinician: Referring Provider: [name] Treating Provider/Extender: Tito Dine in Treatment: 6 Diagnosis Coding ICD-10 Codes Code Description [phone] Non-pressure chronic ulcer of right calf limited to breakdown of skin I87.331 Chronic venous hypertension (idiopathic) with ulcer and inflammation of right lower extremity E11.622 Type 2 diabetes mellitus with other skin ulcer I89.0 Lymphedema, not elsewhere classified S81.012D Laceration without foreign body, left knee, subsequent encounter Facility  Procedures CPT4 Code: [id number] Description: 77824 - WOUND CARE VISIT-LEV 2 EST PT Modifier: Quantity: 1 Physician Procedures CPT4: Description Modifier Quantity Code [id number] - WC PHYS LEVEL 3 - EST PT 1 ICD-10 Diagnosis Description L97.211 Non-pressure chronic ulcer of right calf limited to breakdown of skin I87.331 Chronic venous hypertension (idiopathic) with ulcer and  inflammation of right lower extremity Electronic Signature(s) Signed: 05/05/2018 5:10:30 PM By: Dellia Nims,  Legrand Como MD Entered By: Linton Ham on 05/05/2018 12:09:58

## 2018-05-07 NOTE — Progress Notes (Signed)
TANIELA, FELTUS ([id number]) Visit Report for 05/05/2018 Arrival Information Details Patient Name: [name], [name] Date of Service: 05/05/2018 9:30 AM Medical Record Number: [id number] Patient Account Number: [id number] Date of Birth/Sex: April 03, 1961 (57 y.o. M) Treating RN: Montey Hora Primary Care Marwah Disbro: [name] Other Clinician: Referring Jaivian Battaglini: [name] Treating Tameeka Luo/Extender: Tito Dine in Treatment: 6 Visit Information History Since Last Visit Added or deleted any medications: No Patient Arrived: Cane Any new allergies or adverse reactions: No Arrival Time: 09:52 Had a fall or experienced change in No Accompanied By: self activities of daily living that may affect Transfer Assistance: None risk of falls: Patient Identification Verified: Yes Signs or symptoms of abuse/neglect since last visito No Secondary Verification Process Completed: Yes Hospitalized since last visit: No Patient Requires Transmission-Based Precautions: No Implantable device outside of the clinic excluding No Patient Has Alerts: Yes cellular tissue based products placed in the center Patient Alerts: DMII since last visit: Has Dressing in Place as Prescribed: Yes Has Compression in Place as Prescribed: Yes Pain Present Now: No Electronic Signature(s) Signed: 05/05/2018 4:55:05 PM By: Montey Hora Entered By: Montey Hora on 05/05/2018 09:54:10 [name] ([id number]) -------------------------------------------------------------------------------- Clinic Level of Care Assessment Details Patient Name: [name] Date of Service: 05/05/2018 9:30 AM Medical Record Number: [id number] Patient Account Number: [id number] Date of Birth/Sex: [birth year] (57 y.o. M) Treating RN: Cornell Barman Primary Care Stacie Knutzen: [name] Other Clinician: Referring Riku Buttery: [name] Treating Siler Mavis/Extender: Tito Dine in Treatment: 6 Clinic Level of Care  Assessment Items TOOL 4 Quantity Score []  - Use when only an EandM is performed on FOLLOW-UP visit 0 ASSESSMENTS - Nursing Assessment / Reassessment []  - Reassessment of Co-morbidities (includes updates in patient status) 0 X- 1 5 Reassessment of Adherence to Treatment Plan ASSESSMENTS - Wound and Skin Assessment / Reassessment X - Simple Wound Assessment / Reassessment - one wound 1 5 []  - 0 Complex Wound Assessment / Reassessment - multiple wounds []  - 0 Dermatologic / Skin Assessment (not related to wound area) ASSESSMENTS - Focused Assessment []  - Circumferential Edema Measurements - multi extremities 0 []  - 0 Nutritional Assessment / Counseling / Intervention []  - 0 Lower Extremity Assessment (monofilament, tuning fork, pulses) []  - 0 Peripheral Arterial Disease Assessment (using hand held doppler) ASSESSMENTS - Ostomy and/or Continence Assessment and Care []  - Incontinence Assessment and Management 0 []  - 0 Ostomy Care Assessment and Management (repouching, etc.) PROCESS - Coordination of Care X - Simple Patient / Family Education for ongoing care 1 15 []  - 0 Complex (extensive) Patient / Family Education for ongoing care []  - 0 Staff obtains Programmer, systems, Records, Test Results / Process Orders []  - 0 Staff telephones HHA, Nursing Homes / Clarify orders / etc []  - 0 Routine Transfer to another Facility (non-emergent condition) []  - 0 Routine Hospital Admission (non-emergent condition) []  - 0 New Admissions / Biomedical engineer / Ordering NPWT, Apligraf, etc. []  - 0 Emergency Hospital Admission (emergent condition) X- 1 10 Simple Discharge Coordination KRISHIV, SANDLER. ([id number]) []  - 0 Complex (extensive) Discharge Coordination PROCESS - Special Needs []  - Pediatric / Minor Patient Management 0 []  - 0 Isolation Patient Management []  - 0 Hearing / Language / Visual special needs []  - 0 Assessment of Community assistance (transportation, D/C planning,  etc.) []  - 0 Additional assistance / Altered mentation []  - 0 Support Surface(s) Assessment (bed, cushion, seat, etc.) INTERVENTIONS - Wound Cleansing / Measurement X - Simple Wound Cleansing -  one wound 1 5 []  - 0 Complex Wound Cleansing - multiple wounds X- 1 5 Wound Imaging (photographs - any number of wounds) []  - 0 Wound Tracing (instead of photographs) X- 1 5 Simple Wound Measurement - one wound []  - 0 Complex Wound Measurement - multiple wounds INTERVENTIONS - Wound Dressings []  - Small Wound Dressing one or multiple wounds 0 []  - 0 Medium Wound Dressing one or multiple wounds []  - 0 Large Wound Dressing one or multiple wounds []  - 0 Application of Medications - topical []  - 0 Application of Medications - injection INTERVENTIONS - Miscellaneous []  - External ear exam 0 []  - 0 Specimen Collection (cultures, biopsies, blood, body fluids, etc.) []  - 0 Specimen(s) / Culture(s) sent or taken to Lab for analysis []  - 0 Patient Transfer (multiple staff / Civil Service fast streamer / Similar devices) []  - 0 Simple Staple / Suture removal (25 or less) []  - 0 Complex Staple / Suture removal (26 or more) []  - 0 Hypo / Hyperglycemic Management (close monitor of Blood Glucose) []  - 0 Ankle / Brachial Index (ABI) - do not check if billed separately X- 1 5 Vital Signs Takaki, Junior K. ([id number]) Has the patient been seen at the hospital within the last three years: Yes Total Score: 55 Level Of Care: New/Established - Level 2 Electronic Signature(s) Signed: 05/05/2018 5:19:09 PM By: Gretta Cool, BSN, RN, CWS, Kim RN, BSN Entered By: Gretta Cool, BSN, RN, CWS, Kim on 05/05/2018 10:40:14 [name] ([id number]) -------------------------------------------------------------------------------- Encounter Discharge Information Details Patient Name: [name] Date of Service: 05/05/2018 9:30 AM Medical Record Number: [id number] Patient Account Number: [id number] Date of Birth/Sex: 03-20-1961 (57  y.o. M) Treating RN: Cornell Barman Primary Care Kariah Loredo: [name] Other Clinician: Referring Lillyan Hitson: [name] Treating Lyndle Pang/Extender: Tito Dine in Treatment: 6 Encounter Discharge Information Items Discharge Condition: Stable Ambulatory Status: Cane Discharge Destination: Home Transportation: Private Auto Accompanied By: self Schedule Follow-up Appointment: Yes Clinical Summary of Care: Electronic Signature(s) Signed: 05/05/2018 5:19:09 PM By: Gretta Cool, BSN, RN, CWS, Kim RN, BSN Entered By: Gretta Cool, BSN, RN, CWS, Kim on 05/05/2018 10:40:46 [name] ([id number]) -------------------------------------------------------------------------------- Lower Extremity Assessment Details Patient Name: [name] Date of Service: 05/05/2018 9:30 AM Medical Record Number: [id number] Patient Account Number: [id number] Date of Birth/Sex: 1961/09/28 (57 y.o. M) Treating RN: Montey Hora Primary Care Jaquanda Wickersham: [name] Other Clinician: Referring Bracen Schum: [name] Treating Lynk Marti/Extender: Tito Dine in Treatment: 6 Edema Assessment Assessed: [Left: No] [Right: No] [Left: Edema] [Right: :] Calf Left: Right: Point of Measurement: 36 cm From Medial Instep cm 46.3 cm Ankle Left: Right: Point of Measurement: 14 cm From Medial Instep cm 27 cm Vascular Assessment Pulses: Dorsalis Pedis Palpable: [Right:Yes] Posterior Tibial Extremity colors, hair growth, and conditions: Extremity Color: [Right:Hyperpigmented] Hair Growth on Extremity: [Right:No] Temperature of Extremity: [Right:Warm] Capillary Refill: [Right:< 3 seconds] Toe Nail Assessment Left: Right: Thick: Yes Discolored: Yes Deformed: No Improper Length and Hygiene: No Electronic Signature(s) Signed: 05/05/2018 4:55:05 PM By: Montey Hora Entered By: Montey Hora on 05/05/2018 10:00:45 [name]  ([id number]) -------------------------------------------------------------------------------- Multi Wound Chart Details Patient Name: [name] Date of Service: 05/05/2018 9:30 AM Medical Record Number: [id number] Patient Account Number: [id number] Date of Birth/Sex: January 28, 1961 (57 y.o. M) Treating RN: Cornell Barman Primary Care Nailea Whitehorn: [name] Other Clinician: Referring Roald Lukacs: [name] Treating Sausha Raymond/Extender: Tito Dine in Treatment: 6 Vital Signs Height(in): 75 Pulse(bpm): 68 Weight(lbs): 891 Blood Pressure(mmHg): 145/83 Body Mass Index(BMI): 43  Temperature(F): 98.2 Respiratory Rate 18 (breaths/min): Photos: [3:No Photos] [N/A:N/A] Wound Location: [3:Right, Proximal, Anterior Lower Leg] [N/A:N/A] Wounding Event: [3:Gradually Appeared] [N/A:N/A] Primary Etiology: [3:Diabetic Wound/Ulcer of the Lower Extremity] [N/A:N/A] Date Acquired: [3:04/14/2018] [N/A:N/A] Weeks of Treatment: [3:3] [N/A:N/A] Wound Status: [3:Open] [N/A:N/A] Measurements L x W x D [3:0x0x0] [N/A:N/A] (cm) Area (cm) : [3:0] [N/A:N/A] Volume (cm) : [3:0] [N/A:N/A] % Reduction in Area: [3:100.00%] [N/A:N/A] % Reduction in Volume: [3:100.00%] [N/A:N/A] Classification: [3:Grade 1] [N/A:N/A] Periwound Skin Texture: [3:No Abnormalities Noted] [N/A:N/A] Periwound Skin Moisture: [3:No Abnormalities Noted] [N/A:N/A] Periwound Skin Color: [3:No Abnormalities Noted No] [N/A:N/A N/A] Treatment Notes Electronic Signature(s) Signed: 05/05/2018 5:10:30 PM By: Linton Ham MD Entered By: Linton Ham on 05/05/2018 11:43:28 [name] ([id number]) -------------------------------------------------------------------------------- [place] Details Patient Name: [name] Date of Service: 05/05/2018 9:30 AM Medical Record Number: [id number] Patient Account Number: [id number] Date of Birth/Sex: September 26, 1961 (57 y.o. M) Treating RN: Cornell Barman Primary Care  Aishah Teffeteller: [name] Other Clinician: Referring Donyell Carrell: [name] Treating Catricia Scheerer/Extender: Tito Dine in Treatment: 6 Active Inactive Electronic Signature(s) Signed: 05/05/2018 5:19:09 PM By: Gretta Cool, BSN, RN, CWS, Kim RN, BSN Entered By: Gretta Cool, BSN, RN, CWS, Kim on 05/05/2018 10:33:54 [name] ([id number]) -------------------------------------------------------------------------------- Pain Assessment Details Patient Name: [name] Date of Service: 05/05/2018 9:30 AM Medical Record Number: [id number] Patient Account Number: [id number] Date of Birth/Sex: [birth year] (57 y.o. M) Treating RN: Montey Hora Primary Care Ronette Hank: [name] Other Clinician: Referring Jerimiah Wolman: [name] Treating Lakesia Dahle/Extender: Tito Dine in Treatment: 6 Active Problems Location of Pain Severity and Description of Pain Patient Has Paino No Site Locations Pain Management and Medication Current Pain Management: Electronic Signature(s) Signed: 05/05/2018 4:55:05 PM By: Montey Hora Entered By: Montey Hora on 05/05/2018 09:54:17 [name] ([id number]) -------------------------------------------------------------------------------- Patient/Caregiver Education Details Patient Name: [name] Date of Service: 05/05/2018 9:30 AM Medical Record Number: [id number] Patient Account Number: [id number] Date of Birth/Gender: September 04, 1961 (57 y.o. M) Treating RN: Cornell Barman Primary Care Physician: [name] Other Clinician: Referring Physician: [name] Treating Physician/Extender: Tito Dine in Treatment: 6 Education Assessment Education Provided To: Patient Education Topics Provided Venous: Handouts: Controlling Swelling with Compression Stockings Methods: Demonstration, Explain/Verbal Responses: State content correctly Electronic Signature(s) Signed: 05/05/2018 5:19:09 PM By: Gretta Cool, BSN, RN, CWS, Kim RN,  BSN Entered By: Gretta Cool, BSN, RN, CWS, Kim on 05/05/2018 10:41:00 [name] ([id number]) -------------------------------------------------------------------------------- Wound Assessment Details Patient Name: [name] Date of Service: 05/05/2018 9:30 AM Medical Record Number: [id number] Patient Account Number: [id number] Date of Birth/Sex: [birth year] (57 y.o. M) Treating RN: Montey Hora Primary Care Sopheap Boehle: [name] Other Clinician: Referring Kareli Hossain: [name] Treating Kena Limon/Extender: Tito Dine in Treatment: 6 Wound Status Wound Number: 3 Primary Diabetic Wound/Ulcer of the Lower Etiology: Extremity Wound Location: Right, Proximal, Anterior Lower Leg Wound Status: Open Wounding Event: Gradually Appeared Date Acquired: 04/14/2018 Weeks Of Treatment: 3 Clustered Wound: No Photos Photo Uploaded By: Montey Hora on 05/05/2018 16:14:48 Wound Measurements Length: (cm) 0 % Width: (cm) 0 % Depth: (cm) 0 Area: (cm) 0 Volume: (cm) 0 Reduction in Area: 100% Reduction in Volume: 100% Wound Description Classification: Grade 1 Periwound Skin Texture Texture Color No Abnormalities Noted: No No Abnormalities Noted: No Moisture No Abnormalities Noted: No Electronic Signature(s) Signed: 05/05/2018 4:55:05 PM By: Montey Hora Entered By: Montey Hora on 05/05/2018 09:59:48 [name] ([id number]) -------------------------------------------------------------------------------- [place] Details Patient Name: [name] Date of Service: 05/05/2018 9:30 AM Medical  Record Number: [id number] Patient Account Number: [id number] Date of Birth/Sex: [birth year] (57 y.o. M) Treating RN: Montey Hora Primary Care David Rodriquez: [name] Other Clinician: Referring Yeiden Frenkel: [name] Treating Dorothyann Mourer/Extender: Tito Dine in Treatment: 6 Vital Signs Time Taken: 09:55 Temperature (F): 98.2 Height (in): 75 Pulse (bpm):  68 Weight (lbs): 343 Respiratory Rate (breaths/min): 18 Body Mass Index (BMI): 42.9 Blood Pressure (mmHg): 145/83 Reference Range: 80 - 120 mg / dl Electronic Signature(s) Signed: 05/05/2018 4:55:05 PM By: Montey Hora Entered By: Montey Hora on 05/05/2018 09:59:02

## 2018-05-13 ENCOUNTER — Ambulatory Visit: Payer: Medicare HMO | Admitting: Orthotics

## 2018-05-13 DIAGNOSIS — M21371 Foot drop, right foot: Secondary | ICD-10-CM

## 2018-05-13 NOTE — Progress Notes (Signed)
Patient came in for brace evaluation.  He brought in both and OTS in [place]; as well he is wearing a MAFO right that he received in 2018 from [place].   Per Medicare guidelines, he isn't eligible for another brace until 2021.

## 2018-05-14 DIAGNOSIS — L98499 Non-pressure chronic ulcer of skin of other sites with unspecified severity: Secondary | ICD-10-CM | POA: Diagnosis not present

## 2018-05-14 DIAGNOSIS — I87319 Chronic venous hypertension (idiopathic) with ulcer of unspecified lower extremity: Secondary | ICD-10-CM | POA: Diagnosis not present

## 2018-05-14 DIAGNOSIS — I10 Essential (primary) hypertension: Secondary | ICD-10-CM | POA: Diagnosis not present

## 2018-05-14 DIAGNOSIS — E1140 Type 2 diabetes mellitus with diabetic neuropathy, unspecified: Secondary | ICD-10-CM | POA: Diagnosis not present

## 2018-05-14 DIAGNOSIS — G3184 Mild cognitive impairment, so stated: Secondary | ICD-10-CM | POA: Diagnosis not present

## 2018-05-14 DIAGNOSIS — Z6841 Body Mass Index (BMI) 40.0 and over, adult: Secondary | ICD-10-CM | POA: Diagnosis not present

## 2018-05-14 DIAGNOSIS — G8929 Other chronic pain: Secondary | ICD-10-CM | POA: Diagnosis not present

## 2018-05-14 DIAGNOSIS — E785 Hyperlipidemia, unspecified: Secondary | ICD-10-CM | POA: Diagnosis not present

## 2018-05-14 DIAGNOSIS — E11622 Type 2 diabetes mellitus with other skin ulcer: Secondary | ICD-10-CM | POA: Diagnosis not present

## 2018-05-18 DIAGNOSIS — E138 Other specified diabetes mellitus with unspecified complications: Secondary | ICD-10-CM | POA: Diagnosis not present

## 2018-05-19 ENCOUNTER — Encounter: Payer: Medicare HMO | Attending: Internal Medicine | Admitting: Internal Medicine

## 2018-05-19 DIAGNOSIS — I872 Venous insufficiency (chronic) (peripheral): Secondary | ICD-10-CM | POA: Diagnosis not present

## 2018-05-19 DIAGNOSIS — L97212 Non-pressure chronic ulcer of right calf with fat layer exposed: Secondary | ICD-10-CM | POA: Diagnosis not present

## 2018-05-19 DIAGNOSIS — L97211 Non-pressure chronic ulcer of right calf limited to breakdown of skin: Secondary | ICD-10-CM | POA: Insufficient documentation

## 2018-05-19 DIAGNOSIS — M21371 Foot drop, right foot: Secondary | ICD-10-CM | POA: Diagnosis not present

## 2018-05-19 DIAGNOSIS — E1151 Type 2 diabetes mellitus with diabetic peripheral angiopathy without gangrene: Secondary | ICD-10-CM | POA: Diagnosis not present

## 2018-05-19 DIAGNOSIS — I890 Lymphedema, not elsewhere classified: Secondary | ICD-10-CM | POA: Diagnosis not present

## 2018-05-19 DIAGNOSIS — L539 Erythematous condition, unspecified: Secondary | ICD-10-CM | POA: Diagnosis not present

## 2018-05-19 DIAGNOSIS — E785 Hyperlipidemia, unspecified: Secondary | ICD-10-CM | POA: Diagnosis not present

## 2018-05-19 DIAGNOSIS — E11622 Type 2 diabetes mellitus with other skin ulcer: Secondary | ICD-10-CM | POA: Diagnosis not present

## 2018-05-19 DIAGNOSIS — I10 Essential (primary) hypertension: Secondary | ICD-10-CM | POA: Diagnosis not present

## 2018-05-20 DIAGNOSIS — E138 Other specified diabetes mellitus with unspecified complications: Secondary | ICD-10-CM | POA: Diagnosis not present

## 2018-05-21 NOTE — Progress Notes (Signed)
ROWYN, SPILDE ([id number]) Visit Report for 05/19/2018 HPI Details Patient Name: [name], [name] Date of Service: 05/19/2018 8:45 AM Medical Record Number: [id number] Patient Account Number: [id number] Date of Birth/Sex: 22-Dec-1960 (57 y.o. M) Treating RN: Cornell Barman Primary Care Provider: [name] Other Clinician: Referring Provider: [name] Treating Provider/Extender: Tito Dine in Treatment: 8 History of Present Illness HPI Description: 03/24/18; patient is a 57 year old type II diabetic on oral agents. He tells me he has right foot drop secondary to previous back surgery had some years ago. He wears a brace on the right leg and walks with a quad cane. He had the brace adjusted in December/18 and apparently did not fit properly and he developed wounds predominantly on the posterior right calf where the brace rubbed his leg. The patient describes this as acute he noticed it right away and was able to have the brace readjusted. He subsequently psoas had some wounds on the anterior right leg which she says are tape abrasion injuries. His primary doctor is Dr. Rex Kras at [facility]. It looks as though Dr. Rex Kras saw this initially in January. Started him on cephalexin. Patient was seen again on 03/19/17 without much improvement. He has been using Neosporin to the wound areas. He was started on Augmentin on 03/19/18. The patient does not have a wound history. He has a history of a fractured left patella in 2017. Chronic foot drop on the right apparently related to back surgery. Hyperlipidemia and essential hypertension. 03/24/18; the patient still has a scattering of wounds on the anterior medial extending posteriorly in the right leg. The advertising as this is being secondary to abrasion injury although there is a lot of irritation here and one would have to wonder whether this represents chronic venous inflammation/stasis dermatitis. In any case of that's true it was not  well recognized. 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 He is completing Augmentin at this point I don't think he needs additional antibiotics. we applied silver alginate under 4-layer compression which the patient tolerated well 04/07/18; the patient's major wound areas on the medial right leg. This is in the setting of chronic venous insufficiency/stasis dermatitis and lymphedema. We applied silver alginate under for layer compression. He's been scratching at this through the stop under the stockings. He has superficial wounds just below the anterior stocking line. He also fell and the [employer] parking lot superficial "road burn" on the left knee and left elbow. We did not the define these areas 04/14/18; since wounds on the right leg look better. He still requires debridement especially on the lower medial calf area we've been using silver alginate under compressionat 4 layer. One of the excoriations from the fall last week had a greenish drainage per her intake nurse. This is on the left knee. 04/21/18; wounds on his right leg continued to improve some of them are closed. No debridement is required on the right. The large area of excoriation over the left patella from a fall last week has thick surface eschar which required debridement today but I think is on his way to healing. The patient had venous reflux studies. He does not have either deep or superficial thrombosis in either lower extremity. His reflux evaluation on the right did not comment on any major reflux in the right thigh or right distal superficial veins. He had more abnormalities on the left. These reports are getting more difficult to interpret they didn't comment on anything that would require an  ablation in the superficial veins below the knee. I think he will need ongoing compression. 04/28/18;the patient has one small superficial area on the right medial lower leg. The area on the left knee has healed. He  has 20-30 below-knee stockings. He has very significant chronic venous insufficiency and lymphedema. We've been using silver collagen 05/05/18; all the wounds on the right medial lower leg are closed. He had another fall fortunately no major issues. He has 20/30 below-the-knee stockings which will put him in bilaterally. He has significant chronic venous insufficiency and lymphedemaline 05/19/18; READMISSION The patient comes back to clinic after swimming in a pool last week. He got out he started to notice leakage in the right anterior and medial calf. Then he developed an open wound. He has been using as 20-30 mm compression stocking on legs. He has not had previous wounds on the left. He is not in any particular pain. He did not have the stockings on this morning [name], [name] ([id number]) Electronic Signature(s) Signed: 05/19/2018 6:13:14 PM By: Linton Ham MD Entered By: Linton Ham on 05/19/2018 09:59:38 [name], [name] ([id number]) -------------------------------------------------------------------------------- Physical Exam Details Patient Name: [name] Date of Service: 05/19/2018 8:45 AM Medical Record Number: [id number] Patient Account Number: [id number] Date of Birth/Sex: December 08, 1961 (57 y.o. M) Treating RN: Cornell Barman Primary Care Provider: [name] Other Clinician: Referring Provider: [name] Treating Provider/Extender: Tito Dine in Treatment: 8 Constitutional Patient is hypertensive.. Pulse regular and within target range for patient.Marland Kitchen Respirations regular, non-labored and within target range.. Temperature is normal and within the target range for the patient.Marland Kitchen appears in no distress. Eyes Conjunctivae clear. No discharge. Respiratory Respiratory effort is easy and symmetric bilaterally. Rate is normal at rest and on room air.. Bilateral breath sounds are clear and equal in all lobes with no wheezes, rales or  rhonchi.. Cardiovascular Heart rhythm and rate regular, without murmur or gallop.no CHF. pedal pulses and popliteal pulses palpable. I lateral lymphedema. Lymphatic none palpable in the right popliteal or inguinal area. Integumentary (Hair, Skin) no primary skin issues are seen. There is erythema around the wound on the anterior medial and posterior right calf however there is no tenderness. I don't believe there is cellulitis. Psychiatric No evidence of depression, anxiety, or agitation. Calm, cooperative, and communicative. Appropriate interactions and affect.. Notes wound exam; the area on the right anterior substantially around the right medial into the right posterior distal calf is broken open again. The wound is fairly large but fortunately superficial. There is erythema around it but no tenderness and I don't believe there is cellulitis Electronic Signature(s) Signed: 05/19/2018 6:13:14 PM By: Linton Ham MD Entered By: Linton Ham on 05/19/2018 10:02:24 [name] ([id number]) -------------------------------------------------------------------------------- Physician Orders Details Patient Name: [name] Date of Service: 05/19/2018 8:45 AM Medical Record Number: [id number] Patient Account Number: [id number] Date of Birth/Sex: 22-Nov-1961 (57 y.o. M) Treating RN: Cornell Barman Primary Care Provider: [name] Other Clinician: Referring Provider: [name] Treating Provider/Extender: Tito Dine in Treatment: 8 Verbal / Phone Orders: No Diagnosis Coding Wound Cleansing Wound #5 Right,Circumferential Lower Leg o Cleanse wound with mild soap and water Anesthetic (add to Medication List) Wound #5 Right,Circumferential Lower Leg o Topical Lidocaine 4% cream applied to wound bed prior to debridement (In Clinic Only). Skin Barriers/Peri-Wound Care Wound #5 Right,Circumferential Lower Leg o Barrier cream Primary Wound Dressing Wound #5  Right,Circumferential Lower Leg o Silver Alginate Secondary Dressing Wound #5 Right,Circumferential Lower Leg o ABD  pad Dressing Change Frequency Wound #5 Right,Circumferential Lower Leg o Change dressing every week Follow-up Appointments Wound #5 Right,Circumferential Lower Leg o Return Appointment in 1 week. o Nurse Visit as needed - if needed for re-wrap Edema Control Wound #5 Right,Circumferential Lower Leg o 4-Layer Compression System - Left Lower Extremity. o Patient to wear own compression stockings Electronic Signature(s) Signed: 05/19/2018 6:13:14 PM By: Linton Ham MD Signed: 05/20/2018 2:15:42 PM By: Gretta Cool, BSN, RN, CWS, Kim RN, BSN Entered By: Gretta Cool, BSN, RN, CWS, Kim on 05/19/2018 09:54:24 [name], [name] ([id number], [name] ([id number]) -------------------------------------------------------------------------------- Problem List Details Patient Name: [name] Date of Service: 05/19/2018 8:45 AM Medical Record Number: [id number] Patient Account Number: [id number] Date of Birth/Sex: 1961/11/19 (57 y.o. 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 03/24/2018 No Yes of skin I87.331 Chronic venous hypertension (idiopathic) with ulcer and 03/24/2018 No Yes inflammation of right lower extremity E11.622 Type 2 diabetes mellitus with other skin ulcer 03/24/2018 No Yes I89.0 Lymphedema, not elsewhere classified 03/24/2018 No Yes S81.012D Laceration without foreign body, left knee, subsequent 04/14/2018 No Yes encounter Inactive Problems Resolved Problems Electronic Signature(s) Signed: 05/19/2018 6:13:14 PM By: Linton Ham MD Entered By: Linton Ham on 05/19/2018 09:57:48 [name] ([id number]) -------------------------------------------------------------------------------- Progress Note Details Patient Name: [name] Date of Service: 05/19/2018 8:45 AM Medical Record Number: [id number] Patient Account Number: [id number] Date of Birth/Sex: 1961-11-15 (57 y.o. M) Treating RN: Cornell Barman Primary Care Provider: [name] Other Clinician: Referring Provider: [name] Treating Provider/Extender: Tito Dine in Treatment: 8 Subjective History of Present Illness (HPI) 03/24/18; patient is a 57 year old type II diabetic on oral agents. He tells me he has right foot drop secondary to previous back surgery had some years ago. He wears a brace on the right leg and walks with a quad cane. He had the brace adjusted in December/18 and apparently did not fit properly and he developed wounds predominantly on the posterior right calf where the brace rubbed his leg. The patient describes this as acute he noticed it right away and was able to have the brace readjusted. He subsequently psoas had some wounds on the anterior right leg which she says are tape abrasion injuries. His primary doctor is Dr. Rex Kras at [facility]. It looks as though Dr. Rex Kras saw this initially in January. Started him on cephalexin. Patient was seen again on 03/19/17 without much improvement. He has been using Neosporin to the wound areas. He was started on Augmentin on 03/19/18. The patient does not have a wound history. He has a history of a fractured left patella in 2017. Chronic foot drop on the right apparently related to back surgery. Hyperlipidemia and essential hypertension. 03/24/18; the patient still has a scattering of wounds on the anterior medial extending posteriorly in the right leg. The advertising as this is being secondary to abrasion injury although there is a lot of irritation here and one would have to wonder whether this  represents chronic venous inflammation/stasis dermatitis. In any case of that's true it was not well recognized. 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 He is completing Augmentin at this point I don't think he needs additional antibiotics. we applied silver alginate under 4-layer compression which the patient  tolerated well 04/07/18; the patient's major wound areas on the medial right leg. This is in the setting of chronic venous insufficiency/stasis dermatitis and lymphedema. We applied silver alginate under for layer compression. He's been scratching at this through the stop under the stockings. He has superficial wounds just below the anterior stocking line. He also fell and the [employer] parking lot superficial "road burn" on the left knee and left elbow. We did not the define these areas 04/14/18; since wounds on the right leg look better. He still requires debridement especially on the lower medial calf area we've been using silver alginate under compressionat 4 layer. One of the excoriations from the fall last week had a greenish drainage per her intake nurse. This is on the left knee. 04/21/18; wounds on his right leg continued to improve some of them are closed. No debridement is required on the right. The large area of excoriation over the left patella from a fall last week has thick surface eschar which required debridement today but I think is on his way to healing. The patient had venous reflux studies. He does not have either deep or superficial thrombosis in either lower extremity. His reflux evaluation on the right did not comment on any major reflux in the right thigh or right distal superficial veins. He had more abnormalities on the left. These reports are getting more difficult to interpret they didn't comment on anything that would require an ablation in the superficial veins below the knee. I think he will need ongoing compression. 04/28/18;the patient has one small  superficial area on the right medial lower leg. The area on the left knee has healed. He has 20-30 below-knee stockings. He has very significant chronic venous insufficiency and lymphedema. We've been using silver collagen 05/05/18; all the wounds on the right medial lower leg are closed. He had another fall fortunately no major issues. He has 20/30 below-the-knee stockings which will put him in bilaterally. He has significant chronic venous insufficiency and lymphedemaline 05/19/18; READMISSION The patient comes back to clinic after swimming in a pool last week. He got out he started to notice leakage in the right anterior and medial calf. Then he developed an open wound. He has been using as 20-30 mm compression stocking on legs. He has not had previous wounds on the left. He is not in any particular pain. He did not have the stockings on this morning [name], [name]. ([id number]) Objective Constitutional Patient is hypertensive.. Pulse regular and within target range for patient.Marland Kitchen Respirations regular, non-labored and within target range.. Temperature is normal and within the target range for the patient.Marland Kitchen appears in no distress. Vitals Time Taken: 9:07 AM, Height: 75 in, Weight: 343 lbs, BMI: 42.9, Temperature: 98.5 F, Pulse: 105 bpm, Respiratory Rate: 18 breaths/min, Blood Pressure: 146/88 mmHg. Eyes Conjunctivae clear. No discharge. Respiratory Respiratory effort is easy and symmetric bilaterally. Rate is normal at rest and on room air.. Bilateral breath sounds are clear and equal in all lobes with no wheezes, rales or rhonchi.. Cardiovascular Heart rhythm and rate regular, without murmur or gallop.no CHF. pedal pulses and popliteal pulses palpable. I lateral lymphedema. Lymphatic none palpable in the right popliteal or inguinal area. Psychiatric No evidence of depression, anxiety, or agitation. Calm, cooperative, and communicative. Appropriate interactions and affect.. General Notes:  wound exam; the area on the right anterior substantially around the right medial into the right posterior distal calf is broken open again. The wound is fairly large but fortunately superficial. There  is erythema around it but no tenderness and I don't believe there is cellulitis Integumentary (Hair, Skin) no primary skin issues are seen. There is erythema around the wound on the anterior medial and posterior right calf however there is no tenderness. I don't believe there is cellulitis. Wound #5 status is Open. Original cause of wound was Gradually Appeared. The wound is located on the Right,Circumferential Lower Leg. The wound measures 9.6cm length x 19.8cm width x 0.2cm depth; 149.288cm^2 area and 29.858cm^3 volume. There is Fat Layer (Subcutaneous Tissue) Exposed exposed. There is no tunneling or undermining noted. There is a large amount of serous drainage noted. The wound margin is indistinct and nonvisible. There is large (67- 100%) red, pink granulation within the wound bed. There is a small (1-33%) amount of necrotic tissue within the wound bed including Adherent Slough. The periwound skin appearance exhibited: Excoriation, Erythema. The periwound skin appearance did not exhibit: Callus, Crepitus, Induration, Rash, Scarring, Dry/Scaly, Maceration, Atrophie Blanche, Cyanosis, Ecchymosis, Hemosiderin Staining, Mottled, Pallor, Rubor. The surrounding wound skin color is noted with erythema which is circumferential. Periwound temperature was noted as No Abnormality. The periwound has tenderness on palpation. Assessment [name], [name] ([id number]) Active Problems ICD-10 Non-pressure chronic ulcer of right calf limited to breakdown of skin Chronic venous hypertension (idiopathic) with ulcer and inflammation of right lower extremity Type 2 diabetes mellitus with other skin ulcer Lymphedema, not elsewhere classified Laceration without foreign body, left knee, subsequent  encounter Procedures Wound #5 Pre-procedure diagnosis of Wound #5 is a Lymphedema located on the Right,Circumferential Lower Leg . There was a Four Layer Compression Therapy Procedure with a pre-treatment ABI of 1.1 by Cornell Barman, RN. Post procedure Diagnosis Wound #5: Same as Pre-Procedure Plan Wound Cleansing: Wound #5 Right,Circumferential Lower Leg: Cleanse wound with mild soap and water Anesthetic (add to Medication List): Wound #5 Right,Circumferential Lower Leg: Topical Lidocaine 4% cream applied to wound bed prior to debridement (In Clinic Only). Skin Barriers/Peri-Wound Care: Wound #5 Right,Circumferential Lower Leg: Barrier cream Primary Wound Dressing: Wound #5 Right,Circumferential Lower Leg: Silver Alginate Secondary Dressing: Wound #5 Right,Circumferential Lower Leg: ABD pad Dressing Change Frequency: Wound #5 Right,Circumferential Lower Leg: Change dressing every week Follow-up Appointments: Wound #5 Right,Circumferential Lower Leg: Return Appointment in 1 week. Nurse Visit as needed - if needed for re-wrap Edema Control: Wound #5 Right,Circumferential Lower Leg: 4-Layer Compression System - Left Lower Extremity. Patient to wear own compression stockings [name], [name]. ([id number]) #1 we put silver alginate under 4 layer compression #2 I think the primary issue here was that the 20/30 stockings aren't giving him enough compression to maintain edema. Controlled. He tells me this started after going in a swimming pool although I don't see any evidence of infection here and there was no history of trauma. I think this may be true true and unrelated #3 the plan will be to put him in 4 layer compression and then once we have the edema under control to order him to be 40 stockings for the right side #4 is not had a wound history on the left side the 20/30's seem to be sufficient for now Electronic Signature(s) Signed: 05/19/2018 6:13:14 PM By: Linton Ham  MD Entered By: Linton Ham on 05/19/2018 10:03:38 [name] ([id number]) -------------------------------------------------------------------------------- SuperBill Details Patient Name: [name] Date of Service: 05/19/2018 Medical Record Number: [id number] Patient Account Number: [id number] Date of Birth/Sex: 01-Aug-1961 (57 y.o. M) Treating RN: Cornell Barman Primary Care Provider: [name] Other Clinician: Referring Provider: LITTLE,  KEVIN Treating Provider/Extender: Ricard Dillon Weeks in Treatment: 8 Diagnosis Coding ICD-10 Codes Code Description [phone] Non-pressure chronic ulcer of right calf limited to breakdown of skin I87.331 Chronic venous hypertension (idiopathic) with ulcer and inflammation of right lower extremity E11.622 Type 2 diabetes mellitus with other skin ulcer I89.0 Lymphedema, not elsewhere classified S81.012D Laceration without foreign body, left knee, subsequent encounter Facility Procedures CPT4 Code: [id number] Description: [phone] - WOUND CARE VISIT-LEV 1 EST PT Modifier: Quantity: 1 CPT4 Code: [id number] Description: (Facility Use Only) 29581LT - APPLY MULTLAY COMPRS LWR LT LEG Modifier: Quantity: 1 Physician Procedures CPT4: Description Modifier Quantity Code [id number] - WC PHYS LEVEL 4 - EST PT 1 ICD-10 Diagnosis Description L97.211 Non-pressure chronic ulcer of right calf limited to breakdown of skin I87.331 Chronic venous hypertension (idiopathic) with ulcer and  inflammation of right lower extremity Electronic Signature(s) Signed: 05/19/2018 6:13:14 PM By: Linton Ham MD Entered By: Linton Ham on 05/19/2018 10:04:08

## 2018-05-21 NOTE — Progress Notes (Signed)
ADONAY, SCHEIER ([id number]) Visit Report for 05/19/2018 Arrival Information Details Patient Name: [name], [name] Date of Service: 05/19/2018 8:45 AM Medical Record Number: [id number] Patient Account Number: [id number] Date of Birth/Sex: 1961-03-03 (58 y.o. M) Treating RN: Roger Shelter Primary Care Zeki Bedrosian: [name] Other Clinician: Referring Joncarlo Friberg: [name] Treating Margarit Minshall/Extender: Tito Dine in Treatment: 8 Visit Information History Since Last Visit All ordered tests and consults were completed: No Patient Arrived: Cane Added or deleted any medications: No Arrival Time: 09:06 Any new allergies or adverse reactions: No Accompanied By: self Had a fall or experienced change in No Transfer Assistance: None activities of daily living that may affect Patient Identification Verified: Yes risk of falls: Secondary Verification Process Completed: Yes Signs or symptoms of abuse/neglect since last visito No Patient Requires Transmission-Based Precautions: No Hospitalized since last visit: No Patient Has Alerts: Yes Implantable device outside of the clinic excluding No Patient Alerts: DMII cellular tissue based products placed in the center since last visit: Pain Present Now: No Electronic Signature(s) Signed: 05/19/2018 5:08:02 PM By: Roger Shelter Entered By: Roger Shelter on 05/19/2018 09:07:25 [name] ([id number]) -------------------------------------------------------------------------------- Clinic Level of Care Assessment Details Patient Name: [name] Date of Service: 05/19/2018 8:45 AM Medical Record Number: [id number] Patient Account Number: [id number] Date of Birth/Sex: 24-Jan-1961 (57 y.o. M) Treating RN: Cornell Barman Primary Care Yarielys Beed: [name] Other Clinician: Referring Caoimhe Damron: [name] Treating Zionna Homewood/Extender: Tito Dine in Treatment: 8 Clinic Level of Care Assessment Items TOOL 3 Quantity  Score []  - Use when EandM and Procedure is performed on FOLLOW-UP visit 0 ASSESSMENTS - Nursing Assessment / Reassessment X - Reassessment of Co-morbidities (includes updates in patient status) 1 10 X- 1 5 Reassessment of Adherence to Treatment Plan ASSESSMENTS - Wound and Skin Assessment / Reassessment []  - Points for Wound Assessment can only be taken for a new wound of unknown or different 0 etiology and a procedure is NOT performed to that wound []  - 0 Simple Wound Assessment / Reassessment - one wound []  - 0 Complex Wound Assessment / Reassessment - multiple wounds []  - 0 Dermatologic / Skin Assessment (not related to wound area) ASSESSMENTS - Focused Assessment []  - Circumferential Edema Measurements - multi extremities 0 []  - 0 Nutritional Assessment / Counseling / Intervention []  - 0 Lower Extremity Assessment (monofilament, tuning fork, pulses) []  - 0 Peripheral Arterial Disease Assessment (using hand held doppler) ASSESSMENTS - Ostomy and/or Continence Assessment and Care []  - Incontinence Assessment and Management 0 []  - 0 Ostomy Care Assessment and Management (repouching, etc.) PROCESS - Coordination of Care []  - Points for Discharge Coordination can only be taken for a new wound of unknown or different 0 etiology and a procedure is NOT performed to that wound []  - 0 Simple Patient / Family Education for ongoing care []  - 0 Complex (extensive) Patient / Family Education for ongoing care []  - 0 Staff obtains Programmer, systems, Records, Test Results / Process Orders []  - 0 Staff telephones HHA, Nursing Homes / Clarify orders / etc []  - 0 Routine Transfer to another Facility (non-emergent condition) []  - 0 Routine Hospital Admission (non-emergent condition) [name], [name] ([id number]) []  - 0 New Admissions / Biomedical engineer / Ordering NPWT, Apligraf, etc. []  - 0 Emergency Hospital Admission (emergent condition) []  - 0 Simple Discharge Coordination []  -  0 Complex (extensive) Discharge Coordination PROCESS - Special Needs []  - Pediatric / Minor Patient Management 0 []  - 0 Isolation Patient Management []  -  0 Hearing / Language / Visual special needs []  - 0 Assessment of Community assistance (transportation, D/C planning, etc.) []  - 0 Additional assistance / Altered mentation []  - 0 Support Surface(s) Assessment (bed, cushion, seat, etc.) INTERVENTIONS - Wound Cleansing / Measurement []  - Points for Wound Cleaning / Measurement, Wound Dressing, Specimen Collection and 0 Specimen taken to lab can only be taken for a new wound of unknown or different etiology and a procedure is NOT performed to that wound []  - 0 Simple Wound Cleansing - one wound []  - 0 Complex Wound Cleansing - multiple wounds []  - 0 Wound Imaging (photographs - any number of wounds) []  - 0 Wound Tracing (instead of photographs) []  - 0 Simple Wound Measurement - one wound []  - 0 Complex Wound Measurement - multiple wounds INTERVENTIONS - Wound Dressings []  - Small Wound Dressing one or multiple wounds 0 []  - 0 Medium Wound Dressing one or multiple wounds []  - 0 Large Wound Dressing one or multiple wounds INTERVENTIONS - Miscellaneous []  - External ear exam 0 []  - 0 Specimen Collection (cultures, biopsies, blood, body fluids, etc.) []  - 0 Specimen(s) / Culture(s) sent or taken to Lab for analysis []  - 0 Patient Transfer (multiple staff / Civil Service fast streamer / Similar devices) []  - 0 Simple Staple / Suture removal (25 or less) []  - 0 Complex Staple / Suture removal (26 or more) [name], Christopher K. ([id number]) []  - 0 Hypo / Hyperglycemic Management (close monitor of Blood Glucose) []  - 0 Ankle / Brachial Index (ABI) - do not check if billed separately X- 1 5 Vital Signs Has the patient been seen at the hospital within the last three years: Yes Total Score: 20 Level Of Care: New/Established - Level 1 Electronic Signature(s) Signed: 05/20/2018 2:15:42 PM By:  Gretta Cool, BSN, RN, CWS, Kim RN, BSN Entered By: Gretta Cool, BSN, RN, CWS, Kim on 05/19/2018 09:55:17 [name] ([id number]) -------------------------------------------------------------------------------- Compression Therapy Details Patient Name: [name] Date of Service: 05/19/2018 8:45 AM Medical Record Number: [id number] Patient Account Number: [id number] Date of Birth/Sex: October 20, 1961 (57 y.o. M) Treating RN: Cornell Barman Primary Care Shantoya Geurts: [name] Other Clinician: Referring Jaqwon Manfred: [name] Treating Willys Salvino/Extender: Tito Dine in Treatment: 8 Compression Therapy Performed for Wound Assessment: Wound #5 Right,Circumferential Lower Leg Performed By: Clinician Cornell Barman, RN Compression Type: Four Layer Pre Treatment ABI: 1.1 Post Procedure Diagnosis Same as Pre-procedure Electronic Signature(s) Signed: 05/20/2018 2:15:42 PM By: Gretta Cool, BSN, RN, CWS, Kim RN, BSN Entered By: Gretta Cool, BSN, RN, CWS, Kim on 05/19/2018 09:51:25 [name] ([id number]) -------------------------------------------------------------------------------- Encounter Discharge Information Details Patient Name: [name] Date of Service: 05/19/2018 8:45 AM Medical Record Number: [id number] Patient Account Number: [id number] Date of Birth/Sex: 05/29/1961 (57 y.o. M) Treating RN: Montey Hora Primary Care Irelynd Zumstein: [name] Other Clinician: Referring Jahki Witham: [name] Treating Alzena Gerber/Extender: Tito Dine in Treatment: 8 Encounter Discharge Information Items Discharge Condition: Stable Ambulatory Status: Ambulatory Discharge Destination: Home Transportation: Private Auto Accompanied By: self Schedule Follow-up Appointment: Yes Clinical Summary of Care: Electronic Signature(s) Signed: 05/19/2018 12:04:09 PM By: Montey Hora Entered By: Montey Hora on 05/19/2018 12:04:09 [name]  ([id number]) -------------------------------------------------------------------------------- Lower Extremity Assessment Details Patient Name: [name] Date of Service: 05/19/2018 8:45 AM Medical Record Number: [id number] Patient Account Number: [id number] Date of Birth/Sex: October 20, 1961 (57 y.o. M) Treating RN: Roger Shelter Primary Care Neno Hohensee: [name] Other Clinician: Referring Sherby Moncayo: [name] Treating Evon Dejarnett/Extender: Tito Dine in Treatment: 8  Edema Assessment Assessed: [Left: No] [Right: No] Edema: [Left: Ye] [Right: s] Calf Left: Right: Point of Measurement: 36 cm From Medial Instep cm 50.2 cm Ankle Left: Right: Point of Measurement: 14 cm From Medial Instep cm 29.2 cm Vascular Assessment Claudication: Claudication Assessment [Right:None] Pulses: Dorsalis Pedis Palpable: [Right:Yes] Posterior Tibial Extremity colors, hair growth, and conditions: Extremity Color: [Right:Red] Temperature of Extremity: [Right:Warm] Capillary Refill: [Right:< 3 seconds] Toe Nail Assessment Left: Right: Thick: Yes Discolored: Yes Deformed: No Improper Length and Hygiene: No Electronic Signature(s) Signed: 05/19/2018 5:08:02 PM By: Roger Shelter Entered By: Roger Shelter on 05/19/2018 09:21:05 [name] ([id number]) -------------------------------------------------------------------------------- Multi Wound Chart Details Patient Name: [name] Date of Service: 05/19/2018 8:45 AM Medical Record Number: [id number] Patient Account Number: [id number] Date of Birth/Sex: 01/15/1961 (57 y.o. M) Treating RN: Cornell Barman Primary Care Wandalee Klang: [name] Other Clinician: Referring Cindie Rajagopalan: [name] Treating Aja Whitehair/Extender: Tito Dine in Treatment: 8 Vital Signs Height(in): 75 Pulse(bpm): 105 Weight(lbs): 343 Blood Pressure(mmHg): 146/88 Body Mass Index(BMI): 43 Temperature(F): 98.5 Respiratory  Rate 18 (breaths/min): Photos: [5:No Photos] [N/A:N/A] Wound Location: [5:Right Lower Leg - Circumfernential] [N/A:N/A] Wounding Event: [5:Gradually Appeared] [N/A:N/A] Primary Etiology: [5:Lymphedema] [N/A:N/A] Comorbid History: [5:Hypertension, Type II Diabetes, Neuropathy] [N/A:N/A] Date Acquired: [5:05/11/2018] [N/A:N/A] Weeks of Treatment: [5:0] [N/A:N/A] Wound Status: [5:Open] [N/A:N/A] Measurements L x W x D [5:9.6x19.8x0.2] [N/A:N/A] (cm) Area (cm) : [5:149.288] [N/A:N/A] Volume (cm) : [5:29.858] [N/A:N/A] Classification: [5:Full Thickness Without Exposed Support Structures] [N/A:N/A] Exudate Amount: [5:Large] [N/A:N/A] Exudate Type: [5:Serous] [N/A:N/A] Exudate Color: [5:amber] [N/A:N/A] Wound Margin: [5:Indistinct, nonvisible] [N/A:N/A] Granulation Amount: [5:Large (67-100%)] [N/A:N/A] Granulation Quality: [5:Red, Pink] [N/A:N/A] Necrotic Amount: [5:Small (1-33%)] [N/A:N/A] Exposed Structures: [5:Fat Layer (Subcutaneous Tissue) Exposed: Yes Fascia: No Tendon: No Muscle: No Joint: No Bone: No] [N/A:N/A] Epithelialization: [5:None] [N/A:N/A] Periwound Skin Texture: [5:Excoriation: Yes Induration: No Callus: No Crepitus: No Rash: No Scarring: No] [N/A:N/A] Periwound Skin Moisture: Maceration: No N/A N/A Dry/Scaly: No Periwound Skin Color: Erythema: Yes N/A N/A Atrophie Blanche: No Cyanosis: No Ecchymosis: No Hemosiderin Staining: No Mottled: No Pallor: No Rubor: No Erythema Location: Circumferential N/A N/A Temperature: No Abnormality N/A N/A Tenderness on Palpation: Yes N/A N/A Wound Preparation: Ulcer Cleansing: N/A N/A Rinsed/Irrigated with Saline Topical Anesthetic Applied: Other: lidocaine 4% Procedures Performed: Compression Therapy N/A N/A Treatment Notes Electronic Signature(s) Signed: 05/19/2018 6:13:14 PM By: Linton Ham MD Entered By: Linton Ham on 05/19/2018 09:58:00 [name]  ([id number]) -------------------------------------------------------------------------------- Multi-Disciplinary Care Plan Details Patient Name: [name] Date of Service: 05/19/2018 8:45 AM Medical Record Number: [id number] Patient Account Number: [id number] Date of Birth/Sex: 1961-01-27 (57 y.o. M) Treating RN: Cornell Barman Primary Care Alajia Schmelzer: [name] Other Clinician: Referring Nellene Courtois: [name] Treating Clarnce Homan/Extender: Tito Dine in Treatment: 8 Active Inactive ` Orientation to the Wound Care Program Nursing Diagnoses: Knowledge deficit related to the wound healing center program Goals: Patient/caregiver will verbalize understanding of the [facility] Date Initiated: 03/24/2018 Target Resolution Date: 04/23/2018 Goal Status: Active Interventions: Provide education on orientation to the wound center Notes: ` Soft Tissue Infection Nursing Diagnoses: Potential for infection: soft tissue Goals: Patient will remain free of wound infection Date Initiated: 03/24/2018 Target Resolution Date: 04/23/2018 Goal Status: Active Interventions: Assess signs and symptoms of infection every visit Treatment Activities: Systemic antibiotics : 03/24/2018 Notes: ` Venous Leg Ulcer Nursing Diagnoses: Actual venous Insuffiency (use after diagnosis is confirmed) Goals: Patient/caregiver will verbalize understanding of disease process and disease management Date Initiated: 05/19/2018 Target Resolution Date: 06/09/2018 Delice Lesch,  Va K. ([id number]) Goal Status: Active Verify adequate tissue perfusion prior to therapeutic compression application Date Initiated: 05/19/2018 Target Resolution Date: 06/09/2018 Goal Status: Active Interventions: Assess peripheral edema status every visit. Notes: ` Wound/Skin Impairment Nursing Diagnoses: Impaired tissue integrity Goals: Ulcer/skin breakdown will have a volume reduction of 80% by week 12 Date  Initiated: 03/24/2018 Target Resolution Date: 07/24/2018 Goal Status: Active Ulcer/skin breakdown will heal within 14 weeks Date Initiated: 03/24/2018 Target Resolution Date: 07/24/2018 Goal Status: Active Interventions: Assess ulceration(s) every visit Treatment Activities: Referred to DME Antoniette Peake for dressing supplies : 03/24/2018 Topical wound management initiated : 03/24/2018 Notes: Electronic Signature(s) Signed: 05/20/2018 2:15:42 PM By: Gretta Cool, BSN, RN, CWS, Kim RN, BSN Entered By: Gretta Cool, BSN, RN, CWS, Kim on 05/19/2018 09:50:33 [name] ([id number]) -------------------------------------------------------------------------------- Pain Assessment Details Patient Name: [name] Date of Service: 05/19/2018 8:45 AM Medical Record Number: [id number] Patient Account Number: [id number] Date of Birth/Sex: 09/07/1961 (57 y.o. M) Treating RN: Roger Shelter Primary Care Julaine Zimny: [name] Other Clinician: Referring Kerryn Tennant: [name] Treating Eshal Propps/Extender: Tito Dine in Treatment: 8 Active Problems Location of Pain Severity and Description of Pain Patient Has Paino No Site Locations Pain Management and Medication Current Pain Management: Electronic Signature(s) Signed: 05/19/2018 5:08:02 PM By: Roger Shelter Entered By: Roger Shelter on 05/19/2018 09:07:33 [name] ([id number]) -------------------------------------------------------------------------------- Patient/Caregiver Education Details Patient Name: [name] Date of Service: 05/19/2018 8:45 AM Medical Record Number: [id number] Patient Account Number: [id number] Date of Birth/Gender: 09-15-1961 (57 y.o. M) Treating RN: Montey Hora Primary Care Physician: [name] Other Clinician: Referring Physician: [name] Treating Physician/Extender: Tito Dine in Treatment: 8 Education Assessment Education Provided To: Patient Education Topics  Provided Venous: Handouts: Other: wrap precautions Methods: Explain/Verbal Responses: State content correctly Electronic Signature(s) Signed: 05/19/2018 5:11:38 PM By: Montey Hora Entered By: Montey Hora on 05/19/2018 12:04:28 [name] ([id number]) -------------------------------------------------------------------------------- Wound Assessment Details Patient Name: [name] Date of Service: 05/19/2018 8:45 AM Medical Record Number: [id number] Patient Account Number: [id number] Date of Birth/Sex: Dec 20, 1960 (57 y.o. M) Treating RN: Roger Shelter Primary Care Brycelyn Gambino: [name] Other Clinician: Referring Joeziah Voit: [name] Treating Halah Whiteside/Extender: Tito Dine in Treatment: 8 Wound Status Wound Number: 5 Primary Etiology: Lymphedema Wound Location: Right Lower Leg - Circumfernential Wound Status: Open Wounding Event: Gradually Appeared Comorbid Hypertension, Type II Diabetes, History: Neuropathy Date Acquired: 05/11/2018 Weeks Of Treatment: 0 Clustered Wound: No Photos Wound Measurements Length: (cm) 9.6 % Red Width: (cm) 19.8 % Red Depth: (cm) 0.2 Epith Area: (cm) 149.288 Tunn Volume: (cm) 29.858 Unde uction in Area: 0% uction in Volume: 0% elialization: None eling: No rmining: No Wound Description Full Thickness Without Exposed Support Foul Classification: Structures Slou Wound Margin: Indistinct, nonvisible Exudate Large Amount: Exudate Type: Serous Exudate Color: amber Odor After Cleansing: No gh/Fibrino Yes Wound Bed Granulation Amount: Large (67-100%) Exposed Structure Granulation Quality: Red, Pink Fascia Exposed: No Necrotic Amount: Small (1-33%) Fat Layer (Subcutaneous Tissue) Exposed: Yes Necrotic Quality: Adherent Slough Tendon Exposed: No Muscle Exposed: No Joint Exposed: No Bone Exposed: No Periwound Skin Texture Ketelsen, Salik K. ([id number]) Texture Color No Abnormalities Noted: No No  Abnormalities Noted: No Callus: No Atrophie Blanche: No Crepitus: No Cyanosis: No Excoriation: Yes Ecchymosis: No Induration: No Erythema: Yes Rash: No Erythema Location: Circumferential Scarring: No Hemosiderin Staining: No Mottled: No Moisture Pallor: No No Abnormalities Noted: No Rubor: No Dry / Scaly: No Maceration: No Temperature / Pain Temperature: No Abnormality Tenderness on Palpation: Yes  Wound Preparation Ulcer Cleansing: Rinsed/Irrigated with Saline Topical Anesthetic Applied: Other: lidocaine 4%, Treatment Notes Wound #5 (Right, Circumferential Lower Leg) 1. Cleansed with: Clean wound with Normal Saline 2. Anesthetic Topical Lidocaine 4% cream to wound bed prior to debridement 4. Dressing Applied: Other dressing (specify in notes) 5. Secondary Dressing Applied ABD Pad 7. Secured with 4-Layer Compression System - Right Lower Extremity Notes silvercel Electronic Signature(s) Signed: 05/19/2018 5:24:05 PM By: Roger Shelter Previous Signature: 05/19/2018 5:08:02 PM Version By: Roger Shelter Entered By: Roger Shelter on 05/19/2018 17:22:17 [name] ([id number]) -------------------------------------------------------------------------------- Vitals Details Patient Name: [name] Date of Service: 05/19/2018 8:45 AM Medical Record Number: [id number] Patient Account Number: [id number] Date of Birth/Sex: 1961/12/04 (57 y.o. M) Treating RN: Roger Shelter Primary Care Gabrielle Mester: [name] Other Clinician: Referring Ladaisha Portillo: [name] Treating Sindi Beckworth/Extender: Tito Dine in Treatment: 8 Vital Signs Time Taken: 09:07 Temperature (F): 98.5 Height (in): 75 Pulse (bpm): 105 Weight (lbs): 343 Respiratory Rate (breaths/min): 18 Body Mass Index (BMI): 42.9 Blood Pressure (mmHg): 146/88 Reference Range: 80 - 120 mg / dl Electronic Signature(s) Signed: 05/19/2018 5:08:02 PM By: Roger Shelter Entered By: Roger Shelter on 05/19/2018 09:07:52

## 2018-05-26 ENCOUNTER — Encounter: Payer: Medicare HMO | Admitting: Internal Medicine

## 2018-05-26 DIAGNOSIS — M21371 Foot drop, right foot: Secondary | ICD-10-CM | POA: Diagnosis not present

## 2018-05-26 DIAGNOSIS — E785 Hyperlipidemia, unspecified: Secondary | ICD-10-CM | POA: Diagnosis not present

## 2018-05-26 DIAGNOSIS — I10 Essential (primary) hypertension: Secondary | ICD-10-CM | POA: Diagnosis not present

## 2018-05-26 DIAGNOSIS — S81801A Unspecified open wound, right lower leg, initial encounter: Secondary | ICD-10-CM | POA: Diagnosis not present

## 2018-05-26 DIAGNOSIS — I872 Venous insufficiency (chronic) (peripheral): Secondary | ICD-10-CM | POA: Diagnosis not present

## 2018-05-26 DIAGNOSIS — E11622 Type 2 diabetes mellitus with other skin ulcer: Secondary | ICD-10-CM | POA: Diagnosis not present

## 2018-05-26 DIAGNOSIS — I890 Lymphedema, not elsewhere classified: Secondary | ICD-10-CM | POA: Diagnosis not present

## 2018-05-26 DIAGNOSIS — E1151 Type 2 diabetes mellitus with diabetic peripheral angiopathy without gangrene: Secondary | ICD-10-CM | POA: Diagnosis not present

## 2018-05-26 DIAGNOSIS — L97211 Non-pressure chronic ulcer of right calf limited to breakdown of skin: Secondary | ICD-10-CM | POA: Diagnosis not present

## 2018-05-29 NOTE — Progress Notes (Signed)
CHESKY, HEYER ([id number]) Visit Report for 05/26/2018 Arrival Information Details Patient Name: [name], [name] Date of Service: 05/26/2018 9:15 AM Medical Record Number: [id number] Patient Account Number: [id number] Date of Birth/Sex: 12-12-1961 (57 y.o. M) Treating RN: Ahmed Prima Primary Care [name]: [name] Other Clinician: Referring [name]: [name] Treating [name]/Extender: [name] in Treatment: 9 Visit Information History Since Last Visit All ordered tests and consults were completed: No Patient Arrived: [name] or deleted any medications: No Arrival Time: 09:22 Any new allergies or adverse reactions: No Accompanied By: self Had a fall or experienced change in No Transfer Assistance: Manual activities of daily living that may affect Patient Identification Verified: Yes risk of falls: Secondary Verification Process Completed: Yes Signs or symptoms of abuse/neglect since last visito No Patient Requires Transmission-Based Precautions: No Hospitalized since last visit: No Patient Has Alerts: Yes Implantable device outside of the clinic excluding No Patient Alerts: DMII cellular tissue based products placed in the center since last visit: Has Dressing in Place as Prescribed: Yes Has Compression in Place as Prescribed: Yes Pain Present Now: No Electronic Signature(s) Signed: 05/26/2018 3:54:16 PM By: Alric Quan Entered By: Alric Quan on 05/26/2018 09:22:42 [name] ([id number]) -------------------------------------------------------------------------------- Compression Therapy Details Patient Name: [name] Date of Service: 05/26/2018 9:15 AM Medical Record Number: [id number] Patient Account Number: [id number] Date of Birth/Sex: 1961/04/07 (57 y.o. M) Treating RN: Cornell Barman Primary Care Jaycen Vercher: [name] Other Clinician: Referring Arrie Zuercher: [name] Treating Amyla Heffner/Extender: [name] in Treatment: 9 Compression Therapy Performed for Wound Assessment: Wound #5 Right,Circumferential Lower Leg Performed By: Clinician Cornell Barman, RN Compression Type: Four Layer Pre Treatment ABI: 1.1 Post Procedure Diagnosis Same as Pre-procedure Electronic Signature(s) Signed: 05/27/2018 8:47:48 AM By: Gretta Cool, BSN, RN, CWS, Kim RN, BSN Entered By: Gretta Cool, BSN, RN, CWS, Kim on 05/26/2018 10:01:18 [name] ([id number]) -------------------------------------------------------------------------------- Encounter Discharge Information Details Patient Name: [name] Date of Service: 05/26/2018 9:15 AM Medical Record Number: [id number] Patient Account Number: [id number] Date of Birth/Sex: 06-Jan-1961 (57 y.o. M) Treating RN: Roger Shelter Primary Care Tremain Rucinski: [name] Other Clinician: Referring Khaniyah Bezek: [name] Treating Huntley Demedeiros/Extender: [name] in Treatment: 9 Encounter Discharge Information Items Discharge Condition: Stable Ambulatory Status: Christopher Discharge Destination: Home Transportation: Ambulance Schedule Follow-up Appointment: Yes Clinical Summary of Care: Electronic Signature(s) Signed: 05/26/2018 4:05:56 PM By: Roger Shelter Entered By: Roger Shelter on 05/26/2018 10:23:09 [name] ([id number]) -------------------------------------------------------------------------------- Lower Extremity Assessment Details Patient Name: [name] Date of Service: 05/26/2018 9:15 AM Medical Record Number: [id number] Patient Account Number: [id number] Date of Birth/Sex: 10/10/1961 (57 y.o. M) Treating RN: Ahmed Prima Primary Care Kunta Hilleary: [name] Other Clinician: Referring Sonya Gunnoe: [name] Treating Dayna Geurts/Extender: [name] in Treatment: 9 Edema Assessment Assessed: [Left: No] [Right: No] [Left: Edema] [Right: :] Calf Left: Right: Point of Measurement: 36 cm From Medial Instep cm 47.1  cm Ankle Left: Right: Point of Measurement: 14 cm From Medial Instep cm 28.2 cm Vascular Assessment Pulses: Dorsalis Pedis Palpable: [Right:Yes] Posterior Tibial Extremity colors, hair growth, and conditions: Extremity Color: [Right:Hyperpigmented] Hair Growth on Extremity: [Right:Yes] Temperature of Extremity: [Right:Warm] Capillary Refill: [Right:< 3 seconds] Toe Nail Assessment Left: Right: Thick: Yes Discolored: Yes Deformed: Yes Improper Length and Hygiene: Yes Notes heel to knee- 54cm Electronic Signature(s) Signed: 05/26/2018 3:54:16 PM By: Alric Quan Entered By: Alric Quan on 05/26/2018 09:40:49 [name], [name] ([id number]) -------------------------------------------------------------------------------- Multi Wound Chart Details Patient Name: [name].  Date of Service: 05/26/2018 9:15 AM Medical Record Number: [id number] Patient Account Number: [id number] Date of Birth/Sex: 21-May-1961 (57 y.o. M) Treating RN: Cornell Barman Primary Care Scotlynn Noyes: [name] Other Clinician: Referring Giles Currie: [name] Treating Nekita Pita/Extender: [name] in Treatment: 9 Vital Signs Height(in): 75 Pulse(bpm): 5 Weight(lbs): 343 Blood Pressure(mmHg): 145/68 Body Mass Index(BMI): 43 Temperature(F): 98.2 Respiratory Rate 18 (breaths/min): Photos: [5:No Photos] [N/A:N/A] Wound Location: [5:Right Lower Leg - Circumfernential] [N/A:N/A] Wounding Event: [5:Gradually Appeared] [N/A:N/A] Primary Etiology: [5:Lymphedema] [N/A:N/A] Comorbid History: [5:Hypertension, Type II Diabetes, Neuropathy] [N/A:N/A] Date Acquired: [5:05/11/2018] [N/A:N/A] Weeks of Treatment: [5:1] [N/A:N/A] Wound Status: [5:Open] [N/A:N/A] Measurements L x W x D [5:8.8x19.8x0.1] [N/A:N/A] (cm) Area (cm) : [5:136.848] [N/A:N/A] Volume (cm) : [5:13.685] [N/A:N/A] % Reduction in Area: [5:8.30%] [N/A:N/A] % Reduction in Volume: [5:54.20%] [N/A:N/A] Classification:  [5:Full Thickness Without Exposed Support Structures] [N/A:N/A] Exudate Amount: [5:Large] [N/A:N/A] Exudate Type: [5:Serosanguineous] [N/A:N/A] Exudate Color: [5:red, brown] [N/A:N/A] Wound Margin: [5:Indistinct, nonvisible] [N/A:N/A] Granulation Amount: [5:Large (67-100%)] [N/A:N/A] Granulation Quality: [5:Red, Pink] [N/A:N/A] Necrotic Amount: [5:Small (1-33%)] [N/A:N/A] Necrotic Tissue: [5:Eschar, Adherent Slough] [N/A:N/A] Exposed Structures: [5:Fat Layer (Subcutaneous Tissue) Exposed: Yes Fascia: No Tendon: No Muscle: No Joint: No Bone: No] [N/A:N/A] Epithelialization: [5:None] [N/A:N/A] Periwound Skin Texture: [5:Excoriation: Yes Induration: No Callus: No] [N/A:N/A] Crepitus: No Rash: No Scarring: No Periwound Skin Moisture: Maceration: No N/A N/A Dry/Scaly: No Periwound Skin Color: Erythema: Yes N/A N/A Atrophie Blanche: No Cyanosis: No Ecchymosis: No Hemosiderin Staining: No Mottled: No Pallor: No Rubor: No Erythema Location: Circumferential N/A N/A Temperature: No Abnormality N/A N/A Tenderness on Palpation: Yes N/A N/A Wound Preparation: Ulcer Cleansing: N/A N/A Rinsed/Irrigated with Saline, Other: soap and water Topical Anesthetic Applied: Other: lidocaine 4% Procedures Performed: Compression Therapy N/A N/A Treatment Notes Electronic Signature(s) Signed: 05/26/2018 4:49:36 PM By: Linton Ham MD Entered By: Linton Ham on 05/26/2018 10:18:17 [name] ([id number]) -------------------------------------------------------------------------------- Multi-Disciplinary Care Plan Details Patient Name: [name] Date of Service: 05/26/2018 9:15 AM Medical Record Number: [id number] Patient Account Number: [id number] Date of Birth/Sex: 07-May-1961 (57 y.o. M) Treating RN: Cornell Barman Primary Care Sukanya Goldblatt: [name] Other Clinician: Referring Annastyn Silvey: [name] Treating Derold Dorsch/Extender: [name] in Treatment: 9 Active  Inactive ` Orientation to the Wound Care Program Nursing Diagnoses: Knowledge deficit related to the wound healing center program Goals: Patient/caregiver will verbalize understanding of the [place] Program Date Initiated: 03/24/2018 Target Resolution Date: 04/23/2018 Goal Status: Active Interventions: Provide education on orientation to the wound center Notes: ` Soft Tissue Infection Nursing Diagnoses: Potential for infection: soft tissue Goals: Patient will remain free of wound infection Date Initiated: 03/24/2018 Target Resolution Date: 04/23/2018 Goal Status: Active Interventions: Assess signs and symptoms of infection every visit Treatment Activities: Systemic antibiotics : 03/24/2018 Notes: ` Venous Leg Ulcer Nursing Diagnoses: Actual venous Insuffiency (use after diagnosis is confirmed) Goals: Patient/caregiver will verbalize understanding of disease process and disease management Date Initiated: 05/19/2018 Target Resolution Date: 06/09/2018 [name], [name] ([id number]) Goal Status: Active Verify adequate tissue perfusion prior to therapeutic compression application Date Initiated: 05/19/2018 Target Resolution Date: 06/09/2018 Goal Status: Active Interventions: Assess peripheral edema status every visit. Notes: ` Wound/Skin Impairment Nursing Diagnoses: Impaired tissue integrity Goals: Ulcer/skin breakdown will have a volume reduction of 80% by week 12 Date Initiated: 03/24/2018 Target Resolution Date: 07/24/2018 Goal Status: Active Ulcer/skin breakdown will heal within 14 weeks Date Initiated: 03/24/2018 Target Resolution Date: 07/24/2018 Goal Status: Active Interventions: Assess ulceration(s) every visit Treatment Activities: Referred to DME Yordy Matton for dressing  supplies : 03/24/2018 Topical wound management initiated : 03/24/2018 Notes: Electronic Signature(s) Signed: 05/27/2018 8:47:48 AM By: Gretta Cool, BSN, RN, CWS, Kim RN, BSN Entered By: Gretta Cool,  BSN, RN, CWS, Kim on 05/26/2018 09:48:33 [name] ([id number]) -------------------------------------------------------------------------------- Pain Assessment Details Patient Name: [name] Date of Service: 05/26/2018 9:15 AM Medical Record Number: [id number] Patient Account Number: [id number] Date of Birth/Sex: 12-11-1961 (57 y.o. M) Treating RN: Ahmed Prima Primary Care Jamillia Closson: [name] Other Clinician: Referring Dewitte Vannice: [name] Treating Ilianna Bown/Extender: [name] in Treatment: 9 Active Problems Location of Pain Severity and Description of Pain Patient Has Paino No Site Locations Pain Management and Medication Current Pain Management: Electronic Signature(s) Signed: 05/26/2018 3:54:16 PM By: Alric Quan Entered By: Alric Quan on 05/26/2018 09:22:55 [name] ([id number]) -------------------------------------------------------------------------------- Patient/Caregiver Education Details Patient Name: [name] Date of Service: 05/26/2018 9:15 AM Medical Record Number: [id number] Patient Account Number: [id number] Date of Birth/Gender: 03-19-1961 (57 y.o. M) Treating RN: Roger Shelter Primary Care Physician: [name] Other Clinician: Referring Physician: Hulan Hines Treating Physician/Extender: [name] in Treatment: 9 Education Assessment Education Provided To: Patient Education Topics Provided Wound Debridement: Handouts: Wound Debridement Methods: Explain/Verbal Responses: State content correctly Wound/Skin Impairment: Handouts: Caring for Your Ulcer Methods: Explain/Verbal Responses: State content correctly Electronic Signature(s) Signed: 05/26/2018 4:05:56 PM By: Roger Shelter Entered By: Roger Shelter on 05/26/2018 10:23:28 [name] ([id number]) -------------------------------------------------------------------------------- Wound Assessment Details Patient  Name: [name] Date of Service: 05/26/2018 9:15 AM Medical Record Number: [id number] Patient Account Number: [id number] Date of Birth/Sex: [birth year] (57 y.o. M) Treating RN: Ahmed Prima Primary Care Caelie R[place]: [name] Other Clinician: Referring Mikaiya Tramble: [name] Treating Aune Adami/Extender: [name] in Treatment: 9 Wound Status Wound Number: 5 Primary Etiology: Lymphedema Wound Location: Right Lower Leg - Circumfernential Wound Status: Open Wounding Event: Gradually Appeared Comorbid Hypertension, Type II Diabetes, History: Neuropathy Date Acquired: 05/11/2018 Weeks Of Treatment: 1 Clustered Wound: No Photos Photo Uploaded By: Alric Quan on 05/26/2018 15:49:51 Wound Measurements Length: (cm) 8.8 Width: (cm) 19.8 Depth: (cm) 0.1 Area: (cm) 136.848 Volume: (cm) 13.685 % Reduction in Area: 8.3% % Reduction in Volume: 54.2% Epithelialization: None Tunneling: No Undermining: No Wound Description Full Thickness Without Exposed Support Classification: Structures Wound Margin: Indistinct, nonvisible Exudate Large Amount: Exudate Type: Serosanguineous Exudate Color: red, brown Foul Odor After Cleansing: No Slough/Fibrino Yes Wound Bed Granulation Amount: Large (67-100%) Exposed Structure Granulation Quality: Red, Pink Fascia Exposed: No Necrotic Amount: Small (1-33%) Fat Layer (Subcutaneous Tissue) Exposed: Yes Necrotic Quality: Eschar, Adherent Slough Tendon Exposed: No Muscle Exposed: No Joint Exposed: No Bone Exposed: No Periwound Skin Texture Texture Color [name], Christopher K. ([id number]) No Abnormalities Noted: No No Abnormalities Noted: No Callus: No Atrophie Blanche: No Crepitus: No Cyanosis: No Excoriation: Yes Ecchymosis: No Induration: No Erythema: Yes Rash: No Erythema Location: Circumferential Scarring: No Hemosiderin Staining: No Mottled: No Moisture Pallor: No No Abnormalities Noted: No Rubor:  No Dry / Scaly: No Maceration: No Temperature / Pain Temperature: No Abnormality Tenderness on Palpation: Yes Wound Preparation Ulcer Cleansing: Rinsed/Irrigated with Saline, Other: soap and water, Topical Anesthetic Applied: Other: lidocaine 4%, Treatment Notes Wound #5 (Right, Circumferential Lower Leg) 1. Cleansed with: Clean wound with Normal Saline 2. Anesthetic Topical Lidocaine 4% cream to wound bed prior to debridement 3. Peri-wound Care: Moisturizing lotion 4. Dressing Applied: Other dressing (specify in notes) 5. Secondary Dressing Applied ABD Pad 7. Secured with 4-Layer Compression System - Right Lower Extremity Notes [employer] Electronic  Signature(s) Signed: 05/26/2018 3:54:16 PM By: Alric Quan Entered By: Alric Quan on 05/26/2018 09:39:40 [name] ([id number]) -------------------------------------------------------------------------------- Vitals Details Patient Name: [name] Date of Service: 05/26/2018 9:15 AM Medical Record Number: [id number] Patient Account Number: [id number] Date of Birth/Sex: 04-Sep-1961 (57 y.o. M) Treating RN: Ahmed Prima Primary Care Nunzio Banet: [name] Other Clinician: Referring Elianny Buxbaum: [name] Treating Dael Howland/Extender: [name] in Treatment: 9 Vital Signs Time Taken: 09:22 Temperature (F): 98.2 Height (in): 75 Pulse (bpm): 76 Weight (lbs): 343 Respiratory Rate (breaths/min): 18 Body Mass Index (BMI): 42.9 Blood Pressure (mmHg): 145/68 Reference Range: 80 - 120 mg / dl Electronic Signature(s) Signed: 05/26/2018 3:54:16 PM By: Alric Quan Entered By: Alric Quan on 05/26/2018 09:25:15

## 2018-05-29 NOTE — Progress Notes (Signed)
[name], [name] ([id number]) Visit Report for 05/26/2018 Debridement Details Patient Name: [name], [name] Date of Service: 05/26/2018 9:15 AM Medical Record Number: [id number] Patient Account Number: [id number] Date of Birth/Sex: [birth year] (57 y.o. M) Treating RN: Cornell Barman Primary Care Provider: [name] Other Clinician: Referring Provider: [name] Treating Provider/Extender: Tito Dine in Treatment: 9 Debridement Performed for Wound #5 Right,Circumferential Lower Leg Assessment: Performed By: Physician Ricard Dillon, MD Debridement Type: Chemical/Enzymatic/Mechanical Agent Used: Saline Pre-procedure Verification/Time Yes - 09:45 Out Taken: Start Time: 09:45 Instrument: Other : gauze Bleeding: None End Time: 09:46 Response to Treatment: Procedure was tolerated well Level of Consciousness: Awake and Alert Post Debridement Measurements of Total Wound Length: (cm) 8.8 Width: (cm) 19.8 Depth: (cm) 0.1 Volume: (cm) 13.685 Character of Wound/Ulcer Post Debridement: Stable Post Procedure Diagnosis Same as Pre-procedure Electronic Signature(s) Signed: 05/26/2018 12:48:48 PM By: Gretta Cool, BSN, RN, CWS, Kim RN, BSN Signed: 05/26/2018 4:49:36 PM By: Linton Ham MD Entered By: Gretta Cool, BSN, RN, CWS, Kim on 05/26/2018 12:48:48 [name], [name] ([id number]) -------------------------------------------------------------------------------- HPI Details Patient Name: [name] Date of Service: 05/26/2018 9:15 AM Medical Record Number: [id number] Patient Account Number: [id number] Date of Birth/Sex: [birth year] (57 y.o. M) Treating RN: Primary Care Provider: [name] Other Clinician: Referring Provider: [name] Treating Provider/Extender: Tito Dine in Treatment: 9 History of Present Illness HPI Description: 03/24/18; patient is a 57 year old type II diabetic on oral agents. He tells me he has right foot drop secondary to previous back  surgery had some years ago. He wears a brace on the right leg and walks with a quad cane. He had the brace adjusted in December/18 and apparently did not fit properly and he developed wounds predominantly on the posterior right calf where the brace rubbed his leg. The patient describes this as acute he noticed it right away and was able to have the brace readjusted. He subsequently psoas had some wounds on the anterior right leg which she says are tape abrasion injuries. His primary doctor is Dr. Rex Kras at [facility]. It looks as though Dr. Rex Kras saw this initially in January. Started him on cephalexin. Patient was seen again on 03/19/17 without much improvement. He has been using Neosporin to the wound areas. He was started on Augmentin on 03/19/18. The patient does not have a wound history. He has a history of a fractured left patella in 2017. Chronic foot drop on the right apparently related to back surgery. Hyperlipidemia and essential hypertension. 03/24/18; the patient still has a scattering of wounds on the anterior medial extending posteriorly in the right leg. The advertising as this is being secondary to abrasion injury although there is a lot of irritation here and one would have to wonder whether this represents chronic venous inflammation/stasis dermatitis. In any case of that's true it was not well recognized. 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 He is completing Augmentin at this point I don't think he needs additional antibiotics. we applied silver alginate under 4-layer compression which the patient tolerated well 04/07/18; the patient's major wound areas on the medial right leg. This is in the setting of chronic venous insufficiency/stasis dermatitis and lymphedema. We applied silver alginate under for layer compression. He's been scratching at this through the stop under the stockings. He has superficial wounds just below the anterior stocking line. He  also fell and the [employer] parking lot superficial "road burn" on the left knee and left elbow. We did not the  define these areas 04/14/18; since wounds on the right leg look better. He still requires debridement especially on the lower medial calf area we've been using silver alginate under compressionat 4 layer. One of the excoriations from the fall last week had a greenish drainage per her intake nurse. This is on the left knee. 04/21/18; wounds on his right leg continued to improve some of them are closed. No debridement is required on the right. The large area of excoriation over the left patella from a fall last week has thick surface eschar which required debridement today but I think is on his way to healing. The patient had venous reflux studies. He does not have either deep or superficial thrombosis in either lower extremity. His reflux evaluation on the right did not comment on any major reflux in the right thigh or right distal superficial veins. He had more abnormalities on the left. These reports are getting more difficult to interpret they didn't comment on anything that would require an ablation in the superficial veins below the knee. I think he will need ongoing compression. 04/28/18;the patient has one small superficial area on the right medial lower leg. The area on the left knee has healed. He has 20-30 below-knee stockings. He has very significant chronic venous insufficiency and lymphedema. We've been using silver collagen 05/05/18; all the wounds on the right medial lower leg are closed. He had another fall fortunately no major issues. He has 20/30 below-the-knee stockings which will put him in bilaterally. He has significant chronic venous insufficiency and lymphedemaline 05/19/18; READMISSION The patient comes back to clinic after swimming in a pool last week. He got out he started to notice leakage in the right anterior and medial calf. Then he developed an open wound. He has been  using as 20-30 mm compression stocking on legs. He has not had previous wounds on the left. He is not in any particular pain. He did not have the stockings on this morning 05/25/18; the area on his right medial lower calf looks better. We have him in 4-layer compression. He has a 20/30 millimeter stocking on his left leg. He is not having particular pain Electronic Signature(s) [name], [name] ([id number]) Signed: 05/26/2018 4:49:36 PM By: Linton Ham MD Entered By: Linton Ham on 05/26/2018 10:19:33 [name] ([id number]) -------------------------------------------------------------------------------- Physical Exam Details Patient Name: [name] Date of Service: 05/26/2018 9:15 AM Medical Record Number: [id number] Patient Account Number: [id number] Date of Birth/Sex: 06/24/1961 (57 y.o. M) Treating RN: Primary Care Provider: [name] Other Clinician: Referring Provider: [name] Treating Provider/Extender: Tito Dine in Treatment: 9 Constitutional Patient is hypertensive.. Pulse regular and within target range for patient.Marland Kitchen Respirations regular, non-labored and within target range.. Temperature is normal and within the target range for the patient.Marland Kitchen appears in no distress. Eyes Conjunctivae clear. No discharge. Respiratory Respiratory effort is easy and symmetric bilaterally. Rate is normal at rest and on room air.. Cardiovascular Pedal pulses palpable and strong bilaterally.Marland Kitchen edema is well-controlled in the right leg. Lymphatic none palpable in the popliteal or inguinal area. Integumentary (Hair, Skin) venous insufficiency and venous inflammation/stasis dermatitis. Notes wound exam; the area on the right anterior leg and substantially around the right medial leg and the right posterior calf has epithelialization with scattered areas that remain open albeit superficially. There is no evidence of infection. He has venous inflammation in this  area Electronic Signature(s) Signed: 05/26/2018 4:49:36 PM By: Linton Ham MD Entered By: Linton Ham on 05/26/2018 10:21:49  [name], [name] ([id number]) -------------------------------------------------------------------------------- Physician Orders Details Patient Name: [name] Date of Service: 05/26/2018 9:15 AM Medical Record Number: [id number] Patient Account Number: [id number] Date of Birth/Sex: May 29, 1961 (57 y.o. M) Treating RN: Cornell Barman Primary Care Provider: [name] Other Clinician: Referring Provider: [name] Treating Provider/Extender: Tito Dine in Treatment: 9 Verbal / Phone Orders: No Diagnosis Coding Wound Cleansing Wound #5 Right,Circumferential Lower Leg o Cleanse wound with mild soap and water Anesthetic (add to Medication List) Wound #5 Right,Circumferential Lower Leg o Topical Lidocaine 4% cream applied to wound bed prior to debridement (In Clinic Only). Skin Barriers/Peri-Wound Care Wound #5 Right,Circumferential Lower Leg o Barrier cream Primary Wound Dressing Wound #5 Right,Circumferential Lower Leg o Silver Alginate Secondary Dressing Wound #5 Right,Circumferential Lower Leg o ABD pad Dressing Change Frequency Wound #5 Right,Circumferential Lower Leg o Change dressing every week Follow-up Appointments Wound #5 Right,Circumferential Lower Leg o Return Appointment in 1 week. o Nurse Visit as needed - if needed for re-wrap Edema Control Wound #5 Right,Circumferential Lower Leg o 4-Layer Compression System - Left Lower Extremity. o Patient to wear own compression stockings Services and Therapies o DME provider, dressing supplies - [employer] Compression Wrap Electronic Signature(s) Signed: 05/26/2018 4:49:36 PM By: Linton Ham MD [name] ([id number]) Signed: 05/27/2018 8:47:48 AM By: Gretta Cool, BSN, RN, CWS, Kim RN, BSN Entered By: Gretta Cool, BSN, RN, CWS, Kim on 05/26/2018 10:00:07 [name], [name] ([id number]) -------------------------------------------------------------------------------- Problem List Details Patient Name: [name] Date of Service: 05/26/2018 9:15 AM Medical Record Number: [id number] Patient Account Number: [id number] Date of Birth/Sex: October 22, 1961 (57 y.o. 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/24/2018 No Yes of skin I87.331 Chronic venous hypertension (idiopathic) with ulcer and 03/24/2018 No Yes inflammation of right lower extremity E11.622 Type 2 diabetes mellitus with other skin ulcer 03/24/2018 No Yes I89.0 Lymphedema, not elsewhere classified 03/24/2018 No Yes S81.012D Laceration without foreign body, left knee, subsequent 04/14/2018 No Yes encounter Inactive Problems Resolved Problems Electronic Signature(s) Signed: 05/26/2018 4:49:36 PM By: Linton Ham MD Entered By: Linton Ham on 05/26/2018 10:18:04 [name] ([id number]) -------------------------------------------------------------------------------- Progress Note Details Patient Name: [name] Date of Service: 05/26/2018 9:15 AM Medical Record Number: [id number] Patient Account Number: [id number] Date of Birth/Sex: Aug 08, 1961 (57 y.o. M) Treating RN: Primary Care Provider: [name] Other Clinician: Referring Provider: [name] Treating Provider/Extender: Tito Dine in Treatment: 9 Subjective History of Present Illness (HPI) 03/24/18; patient is a 57 year old type II diabetic on oral agents. He tells me he has right foot drop secondary to previous back surgery had some years ago. He wears a brace on the right leg and walks with a quad cane. He had  the brace adjusted in December/18 and apparently did not fit properly and he developed wounds predominantly on the posterior right calf where the brace rubbed his leg. The patient describes this as acute he noticed it right away and was able to have the brace readjusted. He subsequently psoas had some wounds on the anterior right leg which she says are tape abrasion injuries. His primary doctor is Dr. Rex Kras at [facility]. It looks as though Dr. Rex Kras saw this initially in January. Started him on cephalexin. Patient was seen again on 03/19/17 without much improvement.  He has been using Neosporin to the wound areas. He was started on Augmentin on 03/19/18. The patient does not have a wound history. He has a history of a fractured left patella in 2017. Chronic foot drop on the right apparently related to back surgery. Hyperlipidemia and essential hypertension. 03/24/18; the patient still has a scattering of wounds on the anterior medial extending posteriorly in the right leg. The advertising as this is being secondary to abrasion injury although there is a lot of irritation here and one would have to wonder whether this represents chronic venous inflammation/stasis dermatitis. In any case of that's true it was not well recognized. 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 He is completing Augmentin at this point I don't think he needs additional antibiotics. we applied silver alginate under 4-layer compression which the patient tolerated well 04/07/18; the patient's major wound areas on the medial right leg. This is in the setting of chronic venous insufficiency/stasis dermatitis and lymphedema. We applied silver alginate under for layer compression. He's been scratching at this through the stop under the stockings. He has superficial wounds just below the anterior stocking line. He also fell and the [employer] parking lot superficial "road burn" on the left knee and left elbow. We did  not the define these areas 04/14/18; since wounds on the right leg look better. He still requires debridement especially on the lower medial calf area we've been using silver alginate under compressionat 4 layer. One of the excoriations from the fall last week had a greenish drainage per her intake nurse. This is on the left knee. 04/21/18; wounds on his right leg continued to improve some of them are closed. No debridement is required on the right. The large area of excoriation over the left patella from a fall last week has thick surface eschar which required debridement today but I think is on his way to healing. The patient had venous reflux studies. He does not have either deep or superficial thrombosis in either lower extremity. His reflux evaluation on the right did not comment on any major reflux in the right thigh or right distal superficial veins. He had more abnormalities on the left. These reports are getting more difficult to interpret they didn't comment on anything that would require an ablation in the superficial veins below the knee. I think he will need ongoing compression. 04/28/18;the patient has one small superficial area on the right medial lower leg. The area on the left knee has healed. He has 20-30 below-knee stockings. He has very significant chronic venous insufficiency and lymphedema. We've been using silver collagen 05/05/18; all the wounds on the right medial lower leg are closed. He had another fall fortunately no major issues. He has 20/30 below-the-knee stockings which will put him in bilaterally. He has significant chronic venous insufficiency and lymphedemaline 05/19/18; READMISSION The patient comes back to clinic after swimming in a pool last week. He got out he started to notice leakage in the right anterior and medial calf. Then he developed an open wound. He has been using as 20-30 mm compression stocking on legs. He has not had previous wounds on the left. He is  not in any particular pain. He did not have the stockings on this morning 05/25/18; the area on his right medial lower calf looks better. We have him in 4-layer compression. He has a 20/30 millimeter stocking on his left leg. He is not having particular pain [name], Christopher K. ([id number]) Objective Constitutional Patient  is hypertensive.. Pulse regular and within target range for patient.Marland Kitchen Respirations regular, non-labored and within target range.. Temperature is normal and within the target range for the patient.Marland Kitchen appears in no distress. Vitals Time Taken: 9:22 AM, Height: 75 in, Weight: 343 lbs, BMI: 42.9, Temperature: 98.2 F, Pulse: 76 bpm, Respiratory Rate: 18 breaths/min, Blood Pressure: 145/68 mmHg. Eyes Conjunctivae clear. No discharge. Respiratory Respiratory effort is easy and symmetric bilaterally. Rate is normal at rest and on room air.. Cardiovascular Pedal pulses palpable and strong bilaterally.Marland Kitchen edema is well-controlled in the right leg. Lymphatic none palpable in the popliteal or inguinal area. General Notes: wound exam; the area on the right anterior leg and substantially around the right medial leg and the right posterior calf has epithelialization with scattered areas that remain open albeit superficially. There is no evidence of infection. He has venous inflammation in this area Integumentary (Hair, Skin) venous insufficiency and venous inflammation/stasis dermatitis. Wound #5 status is Open. Original cause of wound was Gradually Appeared. The wound is located on the Right,Circumferential Lower Leg. The wound measures 8.8cm length x 19.8cm width x 0.1cm depth; 136.848cm^2 area and 13.685cm^3 volume. There is Fat Layer (Subcutaneous Tissue) Exposed exposed. There is no tunneling or undermining noted. There is a large amount of serosanguineous drainage noted. The wound margin is indistinct and nonvisible. There is large (67-100%) red, pink granulation within the wound bed.  There is a small (1-33%) amount of necrotic tissue within the wound bed including Eschar and Adherent Slough. The periwound skin appearance exhibited: Excoriation, Erythema. The periwound skin appearance did not exhibit: Callus, Crepitus, Induration, Rash, Scarring, Dry/Scaly, Maceration, Atrophie Blanche, Cyanosis, Ecchymosis, Hemosiderin Staining, Mottled, Pallor, Rubor. The surrounding wound skin color is noted with erythema which is circumferential. Periwound temperature was noted as No Abnormality. The periwound has tenderness on palpation. Assessment Active Problems ICD-10 Non-pressure chronic ulcer of right calf limited to breakdown of skin Chronic venous hypertension (idiopathic) with ulcer and inflammation of right lower extremity [name], KOMATSU. ([id number]) Type 2 diabetes mellitus with other skin ulcer Lymphedema, not elsewhere classified Laceration without foreign body, left knee, subsequent encounter Procedures Wound #5 Pre-procedure diagnosis of Wound #5 is a Lymphedema located on the Right,Circumferential Lower Leg . There was a Four Layer Compression Therapy Procedure with a pre-treatment ABI of 1.1 by Cornell Barman, RN. Post procedure Diagnosis Wound #5: Same as Pre-Procedure Plan Wound Cleansing: Wound #5 Right,Circumferential Lower Leg: Cleanse wound with mild soap and water Anesthetic (add to Medication List): Wound #5 Right,Circumferential Lower Leg: Topical Lidocaine 4% cream applied to wound bed prior to debridement (In Clinic Only). Skin Barriers/Peri-Wound Care: Wound #5 Right,Circumferential Lower Leg: Barrier cream Primary Wound Dressing: Wound #5 Right,Circumferential Lower Leg: Silver Alginate Secondary Dressing: Wound #5 Right,Circumferential Lower Leg: ABD pad Dressing Change Frequency: Wound #5 Right,Circumferential Lower Leg: Change dressing every week Follow-up Appointments: Wound #5 Right,Circumferential Lower Leg: Return Appointment in 1  week. Nurse Visit as needed - if needed for re-wrap Edema Control: Wound #5 Right,Circumferential Lower Leg: 4-Layer Compression System - Left Lower Extremity. Patient to wear own compression stockings Services and Therapies ordered were: DME provider, dressing supplies - [employer] Compression Wrap #1 the wound was vigorously washed with gauze and normal saline #2 silver alginate applied [name], Christopher K. ([id number]) #3 we continue in for lower compression #4 the patient is going to need more aggressive compression going forward. He does not think he can get 30/40 stockings on as he is already having trouble with the 20/30 stockings.  we have recommended wraparound stockings such as a Presenter, broadcasting) Signed: 05/26/2018 4:49:36 PM By: Linton Ham MD Entered By: Linton Ham on 05/26/2018 10:25:25 [name] ([id number]) -------------------------------------------------------------------------------- SuperBill Details Patient Name: [name] Date of Service: 05/26/2018 Medical Record Number: [id number] Patient Account Number: [id number] Date of Birth/Sex: [birth year] (57 y.o. M) Treating RN: Cornell Barman Primary Care Provider: [name] Other Clinician: Referring Provider: [name] Treating Provider/Extender: Tito Dine in Treatment: 9 Diagnosis Coding ICD-10 Codes Code Description [phone] Non-pressure chronic ulcer of right calf limited to breakdown of skin I87.331 Chronic venous hypertension (idiopathic) with ulcer and inflammation of right lower extremity E11.622 Type 2 diabetes mellitus with other skin ulcer I89.0 Lymphedema, not elsewhere classified S81.012D Laceration without foreign body, left knee, subsequent encounter Facility Procedures CPT4 Code Description: [id number] (Facility Use Only) [phone] - [place] [facility] LEG Modifier: Quantity: 1 Physician Procedures CPT4: Description Modifier Quantity Code  [id number] - WC PHYS LEVEL 3 - EST PT 1 ICD-10 Diagnosis Description L97.211 Non-pressure chronic ulcer of right calf limited to breakdown of skin I87.331 Chronic venous hypertension (idiopathic) with ulcer and  inflammation of right lower extremity I89.0 Lymphedema, not elsewhere classified Electronic Signature(s) Signed: 05/26/2018 4:49:36 PM By: Linton Ham MD Entered By: Linton Ham on 05/26/2018 20:25:42

## 2018-05-31 DIAGNOSIS — M545 Low back pain: Secondary | ICD-10-CM | POA: Diagnosis not present

## 2018-05-31 DIAGNOSIS — M47896 Other spondylosis, lumbar region: Secondary | ICD-10-CM | POA: Diagnosis not present

## 2018-06-02 ENCOUNTER — Encounter: Payer: Medicare HMO | Admitting: Internal Medicine

## 2018-06-02 DIAGNOSIS — I10 Essential (primary) hypertension: Secondary | ICD-10-CM | POA: Diagnosis not present

## 2018-06-02 DIAGNOSIS — E785 Hyperlipidemia, unspecified: Secondary | ICD-10-CM | POA: Diagnosis not present

## 2018-06-02 DIAGNOSIS — E1151 Type 2 diabetes mellitus with diabetic peripheral angiopathy without gangrene: Secondary | ICD-10-CM | POA: Diagnosis not present

## 2018-06-02 DIAGNOSIS — I872 Venous insufficiency (chronic) (peripheral): Secondary | ICD-10-CM | POA: Diagnosis not present

## 2018-06-02 DIAGNOSIS — I890 Lymphedema, not elsewhere classified: Secondary | ICD-10-CM | POA: Diagnosis not present

## 2018-06-02 DIAGNOSIS — T8189XA Other complications of procedures, not elsewhere classified, initial encounter: Secondary | ICD-10-CM | POA: Diagnosis not present

## 2018-06-02 DIAGNOSIS — M21371 Foot drop, right foot: Secondary | ICD-10-CM | POA: Diagnosis not present

## 2018-06-02 DIAGNOSIS — L97211 Non-pressure chronic ulcer of right calf limited to breakdown of skin: Secondary | ICD-10-CM | POA: Diagnosis not present

## 2018-06-02 DIAGNOSIS — E11622 Type 2 diabetes mellitus with other skin ulcer: Secondary | ICD-10-CM | POA: Diagnosis not present

## 2018-06-04 NOTE — Progress Notes (Signed)
LORING, [name] ([id number]) Visit Report for 06/02/2018 HPI Details Patient Name: [name], [name] Date of Service: 06/02/2018 9:00 AM Medical Record Number: [id number] Patient Account Number: [id number] Date of Birth/Sex: Oct 03, 1961 (57 y.o. M) Treating RN: Cornell Barman Primary Care Provider: [name] Other Clinician: Referring Provider: [name] Treating Provider/Extender: Tito Dine in Treatment: 10 History of Present Illness HPI Description: 03/24/18; patient is a 57 year old type II diabetic on oral agents. He tells me he has right foot drop secondary to previous back surgery had some years ago. He wears a brace on the right leg and walks with a quad cane. He had the brace adjusted in December/18 and apparently did not fit properly and he developed wounds predominantly on the posterior right calf where the brace rubbed his leg. The patient describes this as acute he noticed it right away and was able to have the brace readjusted. He subsequently psoas had some wounds on the anterior right leg which she says are tape abrasion injuries. His primary doctor is Dr. Rex Kras at [facility]. It looks as though Dr. Rex Kras saw this initially in January. Started him on cephalexin. Patient was seen again on 03/19/17 without much improvement. He has been using Neosporin to the wound areas. He was started on Augmentin on 03/19/18. The patient does not have a wound history. He has a history of a fractured left patella in 2017. Chronic foot drop on the right apparently related to back surgery. Hyperlipidemia and essential hypertension. 03/24/18; the patient still has a scattering of wounds on the anterior medial extending posteriorly in the right leg. The advertising as this is being secondary to abrasion injury although there is a lot of irritation here and one would have to wonder whether this represents chronic venous inflammation/stasis dermatitis. In any case of that's true it was  not well recognized. 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 He is completing Augmentin at this point I don't think he needs additional antibiotics. we applied silver alginate under 4-layer compression which the patient tolerated well 04/07/18; the patient's major wound areas on the medial right leg. This is in the setting of chronic venous insufficiency/stasis dermatitis and lymphedema. We applied silver alginate under for layer compression. He's been scratching at this through the stop under the stockings. He has superficial wounds just below the anterior stocking line. He also fell and the [employer] parking lot superficial "road burn" on the left knee and left elbow. We did not the define these areas 04/14/18; since wounds on the right leg look better. He still requires debridement especially on the lower medial calf area we've been using silver alginate under compressionat 4 layer. One of the excoriations from the fall last week had a greenish drainage per her intake nurse. This is on the left knee. 04/21/18; wounds on his right leg continued to improve some of them are closed. No debridement is required on the right. The large area of excoriation over the left patella from a fall last week has thick surface eschar which required debridement today but I think is on his way to healing. The patient had venous reflux studies. He does not have either deep or superficial thrombosis in either lower extremity. His reflux evaluation on the right did not comment on any major reflux in the right thigh or right distal superficial veins. He had more abnormalities on the left. These reports are getting more difficult to interpret they didn't comment on anything that would require an  ablation in the superficial veins below the knee. I think he will need ongoing compression. 04/28/18;the patient has one small superficial area on the right medial lower leg. The area on the left knee has healed. He  has 20-30 below-knee stockings. He has very significant chronic venous insufficiency and lymphedema. We've been using silver collagen 05/05/18; all the wounds on the right medial lower leg are closed. He had another fall fortunately no major issues. He has 20/30 below-the-knee stockings which will put him in bilaterally. He has significant chronic venous insufficiency and lymphedemaline 05/19/18; READMISSION The patient comes back to clinic after swimming in a pool last week. He got out he started to notice leakage in the right anterior and medial calf. Then he developed an open wound. He has been using as 20-30 mm compression stocking on legs. He has not had previous wounds on the left. He is not in any particular pain. He did not have the stockings on this morning 05/25/18; the area on his right medial lower calf looks better. We have him in 4-layer compression. He has a 20/30 millimeter stocking on his left leg. He is not having particular pain LIAHM, GRIVAS. ([id number]) 06/02/18; right medial lower leg is totally epithelialized. He brought in his juxta light stocking today and applied this. He is leg is healed. He did have a fall and he had a superficial excoriation on the right tibia just below the tibial tuberosity however this is superficial and I don't think should preclude discharging him today Electronic Signature(s) Signed: 06/02/2018 4:07:49 PM By: Linton Ham MD Entered By: Linton Ham on 06/02/2018 09:51:00 [name] ([id number]) -------------------------------------------------------------------------------- Physical Exam Details Patient Name: [name] Date of Service: 06/02/2018 9:00 AM Medical Record Number: [id number] Patient Account Number: [id number] Date of Birth/Sex: 07/10/1961 (57 y.o. M) Treating RN: Cornell Barman Primary Care Provider: [name] Other Clinician: Referring Provider: [name] Treating Provider/Extender: Tito Dine in  Treatment: 10 Constitutional Patient is hypertensive.. Pulse regular and within target range for patient.Marland Kitchen Respirations regular, non-labored and within target range.. Temperature is normal and within the target range for the patient.. Notes wound exam; the area on the right anterior leg is epithelialized. Everything is epithelialized on the right medial ankle area. His edema control is excellent and we transitioned him into juxta li Electronic Signature(s) Signed: 06/02/2018 4:07:49 PM By: Linton Ham MD Entered By: Linton Ham on 06/02/2018 09:52:01 [name] ([id number]) -------------------------------------------------------------------------------- Physician Orders Details Patient Name: [name] Date of Service: 06/02/2018 9:00 AM Medical Record Number: [id number] Patient Account Number: [id number] Date of Birth/Sex: [birth year] (57 y.o. M) Treating RN: Cornell Barman Primary Care Provider: [name] Other Clinician: Referring Provider: [name] Treating Provider/Extender: Tito Dine in Treatment: 10 Verbal / Phone Orders: No Diagnosis Coding Edema Control Wound #5 Right,Circumferential Lower Leg o Patient to wear own compression stockings o Patient to wear own Velcro compression garment. Wound #6 Right,Proximal Lower Leg o Patient to wear own compression stockings o Patient to wear own Velcro compression garment. Discharge From [facility] Wound #5 Right,Circumferential Lower Leg o Discharge from [place] complete Wound #6 Right,Proximal Lower Leg o Discharge from [place] complete Electronic Signature(s) Signed: 06/02/2018 4:07:49 PM By: Linton Ham MD Signed: 06/02/2018 4:19:21 PM By: Gretta Cool, BSN, RN, CWS, Kim RN, BSN Entered By: Gretta Cool, BSN, RN, CWS, Kim on 06/02/2018 09:31:58 [name]  ([id number]) -------------------------------------------------------------------------------- Problem List Details Patient Name: CORRALES,  [name] Date of Service: 06/02/2018 9:00 AM Medical Record Number: [id number] Patient Account Number: [id number] Date of Birth/Sex: 31-May-1961 (57 y.o. 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 03/24/2018 No Yes of skin I87.331 Chronic venous hypertension (idiopathic) with ulcer and 03/24/2018 No Yes inflammation of right lower extremity E11.622 Type 2 diabetes mellitus with other skin ulcer 03/24/2018 No Yes I89.0 Lymphedema, not elsewhere classified 03/24/2018 No Yes S81.012D Laceration without foreign body, left knee, subsequent 04/14/2018 No Yes encounter Inactive Problems Resolved Problems Electronic Signature(s) Signed: 06/02/2018 4:07:49 PM By: Linton Ham MD Entered By: Linton Ham on 06/02/2018 09:49:49 [name] ([id number]) -------------------------------------------------------------------------------- Progress Note Details Patient Name: [name] Date of Service: 06/02/2018 9:00 AM Medical Record Number: [id number] Patient Account Number: [id number] Date of Birth/Sex: [birth year] (57 y.o. M) Treating RN: Cornell Barman Primary Care Provider: [name] Other Clinician: Referring Provider: [name] Treating Provider/Extender: Tito Dine in Treatment: 10 Subjective History of Present Illness (HPI) 03/24/18; patient is a 56 year old type II diabetic on oral agents. He tells me he has right foot drop secondary to previous back surgery had some years ago. He wears a brace on the right leg and walks with a  quad cane. He had the brace adjusted in December/18 and apparently did not fit properly and he developed wounds predominantly on the posterior right calf where the brace rubbed his leg. The patient describes this as acute he noticed it right away and was able to have the brace readjusted. He subsequently psoas had some wounds on the anterior right leg which she says are tape abrasion injuries. His primary doctor is Dr. Rex Kras at [facility]. It looks as though Dr. Rex Kras saw this initially in January. Started him on cephalexin. Patient was seen again on 03/19/17 without much improvement. He has been using Neosporin to the wound areas. He was started on Augmentin on 03/19/18. The patient does not have a wound history. He has a history of a fractured left patella in 2017. Chronic foot drop on the right apparently related to back surgery. Hyperlipidemia and essential hypertension. 03/24/18; the patient still has a scattering of wounds on the anterior medial extending posteriorly in the right leg. The advertising as this is being secondary to abrasion injury although there is a lot of irritation here and one would have to wonder whether this represents chronic venous inflammation/stasis dermatitis. In any case of that's true it was not well recognized. 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 He is completing Augmentin at this point I don't think he needs additional antibiotics. we applied silver alginate under 4-layer compression which the patient tolerated well 04/07/18; the patient's major wound areas on the medial right leg. This is in the setting of chronic venous insufficiency/stasis dermatitis and lymphedema. We applied silver alginate under for layer compression. He's been scratching at this through the stop under the stockings. He has superficial wounds just below the anterior stocking line. He also fell and the [employer] parking lot superficial "road burn" on the left knee and  left elbow. We did not the define these areas 04/14/18; since wounds on the right leg look better. He still requires debridement especially on the lower medial calf area we've been using silver alginate under compressionat  4 layer. One of the excoriations from the fall last week had a greenish drainage per her intake nurse. This is on the left knee. 04/21/18; wounds on his right leg continued to improve some of them are closed. No debridement is required on the right. The large area of excoriation over the left patella from a fall last week has thick surface eschar which required debridement today but I think is on his way to healing. The patient had venous reflux studies. He does not have either deep or superficial thrombosis in either lower extremity. His reflux evaluation on the right did not comment on any major reflux in the right thigh or right distal superficial veins. He had more abnormalities on the left. These reports are getting more difficult to interpret they didn't comment on anything that would require an ablation in the superficial veins below the knee. I think he will need ongoing compression. 04/28/18;the patient has one small superficial area on the right medial lower leg. The area on the left knee has healed. He has 20-30 below-knee stockings. He has very significant chronic venous insufficiency and lymphedema. We've been using silver collagen 05/05/18; all the wounds on the right medial lower leg are closed. He had another fall fortunately no major issues. He has 20/30 below-the-knee stockings which will put him in bilaterally. He has significant chronic venous insufficiency and lymphedemaline 05/19/18; READMISSION The patient comes back to clinic after swimming in a pool last week. He got out he started to notice leakage in the right anterior and medial calf. Then he developed an open wound. He has been using as 20-30 mm compression stocking on legs. He has not had previous wounds  on the left. He is not in any particular pain. He did not have the stockings on this morning 05/25/18; the area on his right medial lower calf looks better. We have him in 4-layer compression. He has a 20/30 millimeter stocking on his left leg. He is not having particular pain 06/02/18; right medial lower leg is totally epithelialized. He brought in his juxta light stocking today and applied this. He is leg is healed. He did have a fall and he had a superficial excoriation on the right tibia just below the tibial tuberosity however this is CARNELIUS, HAMMITT. ([id number]) superficial and I don't think should preclude discharging him today Objective Constitutional Patient is hypertensive.. Pulse regular and within target range for patient.Marland Kitchen Respirations regular, non-labored and within target range.. Temperature is normal and within the target range for the patient.. Vitals Time Taken: 8:55 AM, Height: 75 in, Weight: 343 lbs, BMI: 42.9, Temperature: 98.0 F, Pulse: 76 bpm, Respiratory Rate: 18 breaths/min, Blood Pressure: 143/56 mmHg. General Notes: wound exam; the area on the right anterior leg is epithelialized. Everything is epithelialized on the right medial ankle area. His edema control is excellent and we transitioned him into juxta li Integumentary (Hair, Skin) Wound #5 status is Healed - Surgical Closure. Original cause of wound was Gradually Appeared. The wound is located on the Right,Circumferential Lower Leg. The wound measures 0cm length x 0cm width x 0cm depth; 0cm^2 area and 0cm^3 volume. There is Fat Layer (Subcutaneous Tissue) Exposed exposed. There is no tunneling or undermining noted. There is a none present amount of drainage noted. The wound margin is indistinct and nonvisible. There is large (67-100%) red, pink granulation within the wound bed. There is a small (1-33%) amount of necrotic tissue within the wound bed including Eschar. The periwound skin appearance exhibited:  Excoriation, Erythema. The periwound skin appearance did not exhibit: Callus, Crepitus, Induration, Rash, Scarring, Dry/Scaly, Maceration, Atrophie Blanche, Cyanosis, Ecchymosis, Hemosiderin Staining, Mottled, Pallor, Rubor. The surrounding wound skin color is noted with erythema which is circumferential. Periwound temperature was noted as No Abnormality. The periwound has tenderness on palpation. Assessment Active Problems ICD-10 Non-pressure chronic ulcer of right calf limited to breakdown of skin Chronic venous hypertension (idiopathic) with ulcer and inflammation of right lower extremity Type 2 diabetes mellitus with other skin ulcer Lymphedema, not elsewhere classified Laceration without foreign body, left knee, subsequent encounter Plan Edema Control: Wound #5 Right,Circumferential Lower Leg: NISHANT, SCHRECENGOST. ([id number]) Patient to wear own compression stockings Patient to wear own Velcro compression garment. Wound #6 Right,Proximal Lower Leg: Patient to wear own compression stockings Patient to wear own Velcro compression garment. Discharge From [facility] Services: Wound #5 Right,Circumferential Lower Leg: Discharge from [place] complete Wound #6 Right,Proximal Lower Leg: Discharge from [place] complete #1we transitioned the patient into his own juxta light stockings on the right #2 he has 20-30 mm compression stockings on the left Electronic Signature(s) Signed: 06/02/2018 4:07:49 PM By: Linton Ham MD Entered By: Linton Ham on 06/02/2018 09:52:55 [name] ([id number]) -------------------------------------------------------------------------------- SuperBill Details Patient Name: [name] Date of Service: 06/02/2018 Medical Record Number: [id number] Patient Account Number: [id number] Date of Birth/Sex: 1961-02-01 (57 y.o. M) Treating RN: Cornell Barman Primary Care Provider: [name] Other Clinician: Referring  Provider: [name] Treating Provider/Extender: Tito Dine in Treatment: 10 Diagnosis Coding ICD-10 Codes Code Description [phone] Non-pressure chronic ulcer of right calf limited to breakdown of skin I87.331 Chronic venous hypertension (idiopathic) with ulcer and inflammation of right lower extremity E11.622 Type 2 diabetes mellitus with other skin ulcer I89.0 Lymphedema, not elsewhere classified S81.012D Laceration without foreign body, left knee, subsequent encounter Facility Procedures CPT4 Code: [id number] Description: [phone] - WOUND CARE VISIT-LEV 2 EST PT Modifier: Quantity: 1 Physician Procedures CPT4: Description Modifier Quantity Code [id number] - WC PHYS LEVEL 2 - EST PT 1 ICD-10 Diagnosis Description L97.211 Non-pressure chronic ulcer of right calf limited to breakdown of skin I87.331 Chronic venous hypertension (idiopathic) with ulcer and  inflammation of right lower extremity Electronic Signature(s) Signed: 06/02/2018 4:07:49 PM By: Linton Ham MD Entered By: Linton Ham on 06/02/2018 09:53:30

## 2018-06-05 NOTE — Progress Notes (Signed)
ASAR, EVILSIZER ([id number]) Visit Report for 06/02/2018 Arrival Information Details Patient Name: [name], [name] Date of Service: 06/02/2018 9:00 AM Medical Record Number: [id number] Patient Account Number: [id number] Date of Birth/Sex: 12/20/1960 (57 y.o. M) Treating RN: Ahmed Prima Primary Care Kaytee Taliercio: [name] Other Clinician: Referring Cadynce Garrette: [name] Treating Destinie Thornsberry/Extender: Tito Dine in Treatment: 10 Visit Information History Since Last Visit All ordered tests and consults were completed: No Patient Arrived: Cane Added or deleted any medications: No Arrival Time: 08:53 Any new allergies or adverse reactions: No Accompanied By: daughter Had a fall or experienced change in No Transfer Assistance: EasyPivot Patient activities of daily living that may affect Lift risk of falls: Patient Identification Verified: Yes Signs or symptoms of abuse/neglect since last visito No Secondary Verification Process Yes Hospitalized since last visit: No Completed: Implantable device outside of the clinic excluding No Patient Requires Transmission-Based No cellular tissue based products placed in the center Precautions: since last visit: Patient Has Alerts: Yes Has Dressing in Place as Prescribed: Yes Patient Alerts: DMII Has Compression in Place as Prescribed: Yes Pain Present Now: No Electronic Signature(s) Signed: 06/03/2018 4:29:22 PM By: Alric Quan Entered By: Alric Quan on 06/02/2018 08:55:46 [name] ([id number]) -------------------------------------------------------------------------------- Clinic Level of Care Assessment Details Patient Name: [name] Date of Service: 06/02/2018 9:00 AM Medical Record Number: [id number] Patient Account Number: [id number] Date of Birth/Sex: 08/13/1961 (57 y.o. M) Treating RN: Cornell Barman Primary Care Talita Recht: [name] Other Clinician: Referring Cote Mayabb: [name] Treating  Chrys Landgrebe/Extender: Tito Dine in Treatment: 10 Clinic Level of Care Assessment Items TOOL 4 Quantity Score []  - Use when only an EandM is performed on FOLLOW-UP visit 0 ASSESSMENTS - Nursing Assessment / Reassessment []  - Reassessment of Co-morbidities (includes updates in patient status) 0 X- 1 5 Reassessment of Adherence to Treatment Plan ASSESSMENTS - Wound and Skin Assessment / Reassessment X - Simple Wound Assessment / Reassessment - one wound 1 5 []  - 0 Complex Wound Assessment / Reassessment - multiple wounds []  - 0 Dermatologic / Skin Assessment (not related to wound area) ASSESSMENTS - Focused Assessment []  - Circumferential Edema Measurements - multi extremities 0 []  - 0 Nutritional Assessment / Counseling / Intervention []  - 0 Lower Extremity Assessment (monofilament, tuning fork, pulses) []  - 0 Peripheral Arterial Disease Assessment (using hand held doppler) ASSESSMENTS - Ostomy and/or Continence Assessment and Care []  - Incontinence Assessment and Management 0 []  - 0 Ostomy Care Assessment and Management (repouching, etc.) PROCESS - Coordination of Care X - Simple Patient / Family Education for ongoing care 1 15 []  - 0 Complex (extensive) Patient / Family Education for ongoing care X- 1 10 Staff obtains Programmer, systems, Records, Test Results / Process Orders []  - 0 Staff telephones HHA, Nursing Homes / Clarify orders / etc []  - 0 Routine Transfer to another Facility (non-emergent condition) []  - 0 Routine Hospital Admission (non-emergent condition) []  - 0 New Admissions / Biomedical engineer / Ordering NPWT, Apligraf, etc. []  - 0 Emergency Hospital Admission (emergent condition) X- 1 10 Simple Discharge Coordination SHAHRAM, ALEXOPOULOS. ([id number]) []  - 0 Complex (extensive) Discharge Coordination PROCESS - Special Needs []  - Pediatric / Minor Patient Management 0 []  - 0 Isolation Patient Management []  - 0 Hearing / Language / Visual special  needs []  - 0 Assessment of Community assistance (transportation, D/C planning, etc.) []  - 0 Additional assistance / Altered mentation []  - 0 Support Surface(s) Assessment (bed, cushion, seat, etc.) INTERVENTIONS -  Wound Cleansing / Measurement []  - Simple Wound Cleansing - one wound 0 []  - 0 Complex Wound Cleansing - multiple wounds []  - 0 Wound Imaging (photographs - any number of wounds) []  - 0 Wound Tracing (instead of photographs) []  - 0 Simple Wound Measurement - one wound []  - 0 Complex Wound Measurement - multiple wounds INTERVENTIONS - Wound Dressings []  - Small Wound Dressing one or multiple wounds 0 []  - 0 Medium Wound Dressing one or multiple wounds []  - 0 Large Wound Dressing one or multiple wounds []  - 0 Application of Medications - topical []  - 0 Application of Medications - injection INTERVENTIONS - Miscellaneous []  - External ear exam 0 []  - 0 Specimen Collection (cultures, biopsies, blood, body fluids, etc.) []  - 0 Specimen(s) / Culture(s) sent or taken to Lab for analysis []  - 0 Patient Transfer (multiple staff / Civil Service fast streamer / Similar devices) []  - 0 Simple Staple / Suture removal (25 or less) []  - 0 Complex Staple / Suture removal (26 or more) []  - 0 Hypo / Hyperglycemic Management (close monitor of Blood Glucose) []  - 0 Ankle / Brachial Index (ABI) - do not check if billed separately X- 1 5 Vital Signs Pfeifle, Chastin K. ([id number]) Has the patient been seen at the hospital within the last three years: Yes Total Score: 50 Level Of Care: New/Established - Level 2 Electronic Signature(s) Signed: 06/02/2018 4:19:21 PM By: Gretta Cool, BSN, RN, CWS, Kim RN, BSN Entered By: Gretta Cool, BSN, RN, CWS, Kim on 06/02/2018 09:33:50 [name] ([id number]) -------------------------------------------------------------------------------- Encounter Discharge Information Details Patient Name: [name] Date of Service: 06/02/2018 9:00 AM Medical Record Number:  [id number] Patient Account Number: [id number] Date of Birth/Sex: 1961/05/08 (57 y.o. M) Treating RN: Roger Shelter Primary Care Haizlee Henton: [name] Other Clinician: Referring Mandie Crabbe: [name] Treating Aamya Orellana/Extender: Tito Dine in Treatment: 10 Encounter Discharge Information Items Discharge Condition: Stable Ambulatory Status: Cane Discharge Destination: Home Transportation: Other Accompanied By: daughter Schedule Follow-up Appointment: Yes Clinical Summary of Care: Electronic Signature(s) Signed: 06/02/2018 4:10:19 PM By: Roger Shelter Entered By: Roger Shelter on 06/02/2018 09:45:03 [name] ([id number]) -------------------------------------------------------------------------------- Lower Extremity Assessment Details Patient Name: [name] Date of Service: 06/02/2018 9:00 AM Medical Record Number: [id number] Patient Account Number: [id number] Date of Birth/Sex: 23-Mar-1961 (57 y.o. M) Treating RN: Ahmed Prima Primary Care Emmalin Jaquess: [name] Other Clinician: Referring Mignon Bechler: [name] Treating Omaya Nieland/Extender: Tito Dine in Treatment: 10 Edema Assessment Assessed: [Left: No] [Right: No] [Left: Edema] [Right: :] Calf Left: Right: Point of Measurement: 36 cm From Medial Instep cm 47 cm Ankle Left: Right: Point of Measurement: 14 cm From Medial Instep cm 27.5 cm Vascular Assessment Pulses: Dorsalis Pedis Palpable: [Right:Yes] Posterior Tibial Extremity colors, hair growth, and conditions: Extremity Color: [Right:Hyperpigmented] Temperature of Extremity: [Right:Warm] Capillary Refill: [Right:< 3 seconds] Toe Nail Assessment Left: Right: Thick: Yes Discolored: Yes Deformed: Yes Improper Length and Hygiene: Yes Electronic Signature(s) Signed: 06/03/2018 4:29:22 PM By: Alric Quan Entered By: Alric Quan on 06/02/2018 09:04:04 [name]  ([id number]) -------------------------------------------------------------------------------- Multi Wound Chart Details Patient Name: [name] Date of Service: 06/02/2018 9:00 AM Medical Record Number: [id number] Patient Account Number: [id number] Date of Birth/Sex: [birth year] (57 y.o. M) Treating RN: Cornell Barman Primary Care Arturo Sofranko: [name] Other Clinician: Referring Emerald Shor: [name] Treating Ondra Deboard/Extender: Tito Dine in Treatment: 10 Vital Signs Height(in): 75 Pulse(bpm): 76 Weight(lbs): 941 Blood Pressure(mmHg): 143/56 Body Mass Index(BMI): 43 Temperature(F): 98.0 Respiratory Rate 18 (  breaths/min): Photos: [5:No Photos] [N/A:N/A] Wound Location: [5:Right Lower Leg - Circumfernential] [N/A:N/A] Wounding Event: [5:Gradually Appeared] [N/A:N/A] Primary Etiology: [5:Lymphedema] [N/A:N/A] Comorbid History: [5:Hypertension, Type II Diabetes, Neuropathy] [N/A:N/A] Date Acquired: [5:05/11/2018] [N/A:N/A] Weeks of Treatment: [5:2] [N/A:N/A] Wound Status: [5:Healed - Surgical Closure] [N/A:N/A] Measurements L x W x D [5:0x0x0] [N/A:N/A] (cm) Area (cm) : [5:0] [N/A:N/A] Volume (cm) : [5:0] [N/A:N/A] % Reduction in Area: [5:100.00%] [N/A:N/A] % Reduction in Volume: [5:100.00%] [N/A:N/A] Classification: [5:Full Thickness Without Exposed Support Structures] [N/A:N/A] Exudate Amount: [5:None Present] [N/A:N/A] Wound Margin: [5:Indistinct, nonvisible] [N/A:N/A] Granulation Amount: [5:Large (67-100%)] [N/A:N/A] Granulation Quality: [5:Red, Pink] [N/A:N/A] Necrotic Amount: [5:Small (1-33%)] [N/A:N/A] Necrotic Tissue: [5:Eschar] [N/A:N/A] Exposed Structures: [5:Fat Layer (Subcutaneous Tissue) Exposed: Yes Fascia: No Tendon: No Muscle: No Joint: No Bone: No] [N/A:N/A] Epithelialization: [5:Large (67-100%)] [N/A:N/A] Periwound Skin Texture: [5:Excoriation: Yes Induration: No Callus: No Crepitus: No] [N/A:N/A] Rash: No Scarring: No Periwound Skin  Moisture: Maceration: No N/A N/A Dry/Scaly: No Periwound Skin Color: Erythema: Yes N/A N/A Atrophie Blanche: No Cyanosis: No Ecchymosis: No Hemosiderin Staining: No Mottled: No Pallor: No Rubor: No Erythema Location: Circumferential N/A N/A Temperature: No Abnormality N/A N/A Tenderness on Palpation: Yes N/A N/A Wound Preparation: Ulcer Cleansing: N/A N/A Rinsed/Irrigated with Saline, Other: soap and water Topical Anesthetic Applied: None Treatment Notes Wound #1 (Right, Posterior Lower Leg) 1. Cleansed with: Clean wound with Normal Saline Notes juso wrap. Electronic Signature(s) Signed: 06/02/2018 4:07:49 PM By: Linton Ham MD Entered By: Linton Ham on 06/02/2018 09:49:59 [name] ([id number]) -------------------------------------------------------------------------------- Multi-Disciplinary Care Plan Details Patient Name: [name] Date of Service: 06/02/2018 9:00 AM Medical Record Number: [id number] Patient Account Number: [id number] Date of Birth/Sex: 1961-08-31 (57 y.o. M) Treating RN: Cornell Barman Primary Care Daeron Carreno: [name] Other Clinician: Referring Greco Gastelum: [name] Treating Carlynn Leduc/Extender: Tito Dine in Treatment: 10 Active Inactive Electronic Signature(s) Signed: 06/03/2018 8:06:05 AM By: Gretta Cool, BSN, RN, CWS, Kim RN, BSN Previous Signature: 06/02/2018 4:19:21 PM Version By: Gretta Cool, BSN, RN, CWS, Kim RN, BSN Entered By: Gretta Cool, BSN, RN, CWS, Kim on 06/03/2018 08:06:05 [name] ([id number]) -------------------------------------------------------------------------------- Pain Assessment Details Patient Name: [name] Date of Service: 06/02/2018 9:00 AM Medical Record Number: [id number] Patient Account Number: [id number] Date of Birth/Sex: [birth year] (57 y.o. M) Treating RN: Ahmed Prima Primary Care Eisa Necaise: [name] Other Clinician: Referring Lovene Maret: [name] Treating  Aarianna Hoadley/Extender: Tito Dine in Treatment: 10 Active Problems Location of Pain Severity and Description of Pain Patient Has Paino No Site Locations Pain Management and Medication Current Pain Management: Electronic Signature(s) Signed: 06/03/2018 4:29:22 PM By: Alric Quan Entered By: Alric Quan on 06/02/2018 08:55:53 [name] ([id number]) -------------------------------------------------------------------------------- Patient/Caregiver Education Details Patient Name: [name] Date of Service: 06/02/2018 9:00 AM Medical Record Number: [id number] Patient Account Number: [id number] Date of Birth/Gender: [birth year] (57 y.o. M) Treating RN: Roger Shelter Primary Care Physician: [name] Other Clinician: Referring Physician: [name] Treating Physician/Extender: Tito Dine in Treatment: 10 Education Assessment Education Provided To: Patient Education Topics Provided Wound/Skin Impairment: Handouts: Skin Care Do's and Dont's Methods: Explain/Verbal Responses: State content correctly Electronic Signature(s) Signed: 06/02/2018 4:10:19 PM By: Roger Shelter Entered By: Roger Shelter on 06/02/2018 09:46:02 [name] ([id number]) -------------------------------------------------------------------------------- Wound Assessment Details Patient Name: [name] Date of Service: 06/02/2018 9:00 AM Medical Record Number: [id number] Patient Account Number: [id number] Date of Birth/Sex: 1961-09-05 (57 y.o. M) Treating RN: Cornell Barman Primary Care Saleema Weppler: [name] Other Clinician: Referring Jadyn Barge: [name] Treating Misty Rago/Extender:  ROBSON, MICHAEL G Weeks in Treatment: 10 Wound Status Wound Number: 5 Primary Etiology: Lymphedema Wound Location: Right Lower Leg - Circumfernential Wound Status: Healed - Surgical Closure Wounding Event: Gradually Appeared Comorbid Hypertension, Type II  Diabetes, History: Neuropathy Date Acquired: 05/11/2018 Weeks Of Treatment: 2 Clustered Wound: No Photos Photo Uploaded By: Alric Quan on 06/03/2018 16:37:06 Wound Measurements Length: (cm) 0 % R Width: (cm) 0 % R Depth: (cm) 0 Epi Area: (cm) 0 Tu Volume: (cm) 0 Un eduction in Area: 100% eduction in Volume: 100% thelialization: Large (67-100%) nneling: No dermining: No Wound Description Full Thickness Without Exposed Support Classification: Structures Wound Margin: Indistinct, nonvisible Exudate None Present Amount: Foul Odor After Cleansing: No Slough/Fibrino Yes Wound Bed Granulation Amount: Large (67-100%) Exposed Structure Granulation Quality: Red, Pink Fascia Exposed: No Necrotic Amount: Small (1-33%) Fat Layer (Subcutaneous Tissue) Exposed: Yes Necrotic Quality: Eschar Tendon Exposed: No Muscle Exposed: No Joint Exposed: No Bone Exposed: No Periwound Skin Texture Texture Color No Abnormalities Noted: No No Abnormalities Noted: No Callus: No Atrophie Blanche: No ERAGON, HAMMOND. ([id number]) Crepitus: No Cyanosis: No Excoriation: Yes Ecchymosis: No Induration: No Erythema: Yes Rash: No Erythema Location: Circumferential Scarring: No Hemosiderin Staining: No Mottled: No Moisture Pallor: No No Abnormalities Noted: No Rubor: No Dry / Scaly: No Maceration: No Temperature / Pain Temperature: No Abnormality Tenderness on Palpation: Yes Wound Preparation Ulcer Cleansing: Rinsed/Irrigated with Saline, Other: soap and water, Topical Anesthetic Applied: None Electronic Signature(s) Signed: 06/02/2018 4:19:21 PM By: Gretta Cool, BSN, RN, CWS, Kim RN, BSN Entered By: Gretta Cool, BSN, RN, CWS, Kim on 06/02/2018 09:32:56 [name] ([id number]) -------------------------------------------------------------------------------- Vitals Details Patient Name: [name] Date of Service: 06/02/2018 9:00 AM Medical Record Number: [id number] Patient  Account Number: [id number] Date of Birth/Sex: Aug 01, 1961 (57 y.o. M) Treating RN: Ahmed Prima Primary Care Maelee Hoot: [name] Other Clinician: Referring Sabri Teal: [name] Treating Bentlee Drier/Extender: Tito Dine in Treatment: 10 Vital Signs Time Taken: 08:55 Temperature (F): 98.0 Height (in): 75 Pulse (bpm): 76 Weight (lbs): 343 Respiratory Rate (breaths/min): 18 Body Mass Index (BMI): 42.9 Blood Pressure (mmHg): 143/56 Reference Range: 80 - 120 mg / dl Electronic Signature(s) Signed: 06/03/2018 4:29:22 PM By: Alric Quan Entered By: Alric Quan on 06/02/2018 09:02:42

## 2018-07-02 DIAGNOSIS — Z7984 Long term (current) use of oral hypoglycemic drugs: Secondary | ICD-10-CM | POA: Diagnosis not present

## 2018-07-02 DIAGNOSIS — E119 Type 2 diabetes mellitus without complications: Secondary | ICD-10-CM | POA: Diagnosis not present

## 2018-07-02 DIAGNOSIS — Z86010 Personal history of colonic polyps: Secondary | ICD-10-CM | POA: Diagnosis not present

## 2018-07-02 DIAGNOSIS — E782 Mixed hyperlipidemia: Secondary | ICD-10-CM | POA: Diagnosis not present

## 2018-07-02 DIAGNOSIS — Z6841 Body Mass Index (BMI) 40.0 and over, adult: Secondary | ICD-10-CM | POA: Diagnosis not present

## 2018-07-02 DIAGNOSIS — E1142 Type 2 diabetes mellitus with diabetic polyneuropathy: Secondary | ICD-10-CM | POA: Diagnosis not present

## 2018-07-02 DIAGNOSIS — I10 Essential (primary) hypertension: Secondary | ICD-10-CM | POA: Diagnosis not present

## 2018-07-10 ENCOUNTER — Other Ambulatory Visit (INDEPENDENT_AMBULATORY_CARE_PROVIDER_SITE_OTHER): Payer: Self-pay | Admitting: Orthopaedic Surgery

## 2018-07-12 NOTE — Telephone Encounter (Signed)
Rx request 

## 2018-08-25 DIAGNOSIS — R69 Illness, unspecified: Secondary | ICD-10-CM | POA: Diagnosis not present

## 2018-09-14 DIAGNOSIS — H00019 Hordeolum externum unspecified eye, unspecified eyelid: Secondary | ICD-10-CM | POA: Diagnosis not present

## 2018-09-21 DIAGNOSIS — E119 Type 2 diabetes mellitus without complications: Secondary | ICD-10-CM | POA: Diagnosis not present

## 2018-09-23 DIAGNOSIS — H0011 Chalazion right upper eyelid: Secondary | ICD-10-CM | POA: Diagnosis not present

## 2018-10-05 ENCOUNTER — Other Ambulatory Visit (INDEPENDENT_AMBULATORY_CARE_PROVIDER_SITE_OTHER): Payer: Self-pay | Admitting: Orthopaedic Surgery

## 2018-10-05 NOTE — Telephone Encounter (Signed)
Ok for refill? 

## 2018-10-18 DIAGNOSIS — Z0100 Encounter for examination of eyes and vision without abnormal findings: Secondary | ICD-10-CM | POA: Diagnosis not present

## 2018-10-22 ENCOUNTER — Encounter: Payer: Self-pay | Admitting: Podiatry

## 2018-10-22 ENCOUNTER — Ambulatory Visit: Payer: Medicare HMO | Admitting: Podiatry

## 2018-10-22 DIAGNOSIS — E1159 Type 2 diabetes mellitus with other circulatory complications: Secondary | ICD-10-CM

## 2018-10-22 DIAGNOSIS — M79674 Pain in right toe(s): Secondary | ICD-10-CM

## 2018-10-22 DIAGNOSIS — M79675 Pain in left toe(s): Secondary | ICD-10-CM

## 2018-10-22 DIAGNOSIS — B351 Tinea unguium: Secondary | ICD-10-CM

## 2018-10-22 NOTE — Progress Notes (Signed)
Complaint:  Visit Type: Patient returns to my office for continued preventative foot care services. Complaint: Patient states" my nails have grown long and thick and become painful to walk and wear shoes" Patient has been diagnosed with DM with no foot complications. The patient presents for preventative foot care services. No changes to ROS.  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  All previous skin lesions on right leg have healed.  Podiatric Exam: Vascular: dorsalis pedis and posterior tibial pulses are not palpable bilateral. Capillary return is immediate. Temperature gradient is WNL. Skin turgor WNL  Sensorium: Normal Semmes Weinstein monofilament test.left foot.  LOPS absent right foot. Normal tactile sensation bilaterally. Nail Exam: Pt has thick disfigured discolored nails with subungual debris noted bilateral entire nail hallux through fifth toenails Ulcer Exam: There is no evidence of ulcer or pre-ulcerative changes or infection. Orthopedic Exam: Muscle tone and strength are WNL. No limitations in general ROM. No crepitus or effusions noted. Foot type and digits show no abnormalities. Bony prominences are unremarkable. Skin: No Porokeratosis. No infection or ulcers  Diagnosis:  Onychomycosis, , Pain in right toe, pain in left toes  Treatment & Plan Procedures and Treatment: Consent by patient was obtained for treatment procedures.   Debridement of mycotic and hypertrophic toenails, 1 through 5 bilateral and clearing of subungual debris. No ulceration, no infection noted.  Return Visit-Office Procedure: Patient instructed to return to the office for a follow up visit 5 months for continued evaluation and treatment.    Gardiner Barefoot DPM

## 2018-11-22 DIAGNOSIS — R69 Illness, unspecified: Secondary | ICD-10-CM | POA: Diagnosis not present

## 2018-11-24 DIAGNOSIS — M25571 Pain in right ankle and joints of right foot: Secondary | ICD-10-CM | POA: Diagnosis not present

## 2018-11-24 DIAGNOSIS — M2352 Chronic instability of knee, left knee: Secondary | ICD-10-CM | POA: Diagnosis not present

## 2018-11-29 DIAGNOSIS — M19071 Primary osteoarthritis, right ankle and foot: Secondary | ICD-10-CM | POA: Diagnosis not present

## 2018-11-29 DIAGNOSIS — M1711 Unilateral primary osteoarthritis, right knee: Secondary | ICD-10-CM | POA: Diagnosis not present

## 2018-12-29 DIAGNOSIS — R2689 Other abnormalities of gait and mobility: Secondary | ICD-10-CM | POA: Diagnosis not present

## 2018-12-29 DIAGNOSIS — R262 Difficulty in walking, not elsewhere classified: Secondary | ICD-10-CM | POA: Diagnosis not present

## 2018-12-29 DIAGNOSIS — R2681 Unsteadiness on feet: Secondary | ICD-10-CM | POA: Diagnosis not present

## 2018-12-29 DIAGNOSIS — M6281 Muscle weakness (generalized): Secondary | ICD-10-CM | POA: Diagnosis not present

## 2018-12-31 DIAGNOSIS — R262 Difficulty in walking, not elsewhere classified: Secondary | ICD-10-CM | POA: Diagnosis not present

## 2018-12-31 DIAGNOSIS — R2689 Other abnormalities of gait and mobility: Secondary | ICD-10-CM | POA: Diagnosis not present

## 2018-12-31 DIAGNOSIS — R2681 Unsteadiness on feet: Secondary | ICD-10-CM | POA: Diagnosis not present

## 2018-12-31 DIAGNOSIS — M6281 Muscle weakness (generalized): Secondary | ICD-10-CM | POA: Diagnosis not present

## 2019-01-02 ENCOUNTER — Other Ambulatory Visit (INDEPENDENT_AMBULATORY_CARE_PROVIDER_SITE_OTHER): Payer: Self-pay | Admitting: Orthopaedic Surgery

## 2019-01-03 DIAGNOSIS — R2689 Other abnormalities of gait and mobility: Secondary | ICD-10-CM | POA: Diagnosis not present

## 2019-01-03 DIAGNOSIS — R2681 Unsteadiness on feet: Secondary | ICD-10-CM | POA: Diagnosis not present

## 2019-01-03 DIAGNOSIS — M6281 Muscle weakness (generalized): Secondary | ICD-10-CM | POA: Diagnosis not present

## 2019-01-03 DIAGNOSIS — R262 Difficulty in walking, not elsewhere classified: Secondary | ICD-10-CM | POA: Diagnosis not present

## 2019-01-03 NOTE — Telephone Encounter (Signed)
Ok for refill? 

## 2019-01-05 DIAGNOSIS — R262 Difficulty in walking, not elsewhere classified: Secondary | ICD-10-CM | POA: Diagnosis not present

## 2019-01-05 DIAGNOSIS — R2689 Other abnormalities of gait and mobility: Secondary | ICD-10-CM | POA: Diagnosis not present

## 2019-01-05 DIAGNOSIS — R2681 Unsteadiness on feet: Secondary | ICD-10-CM | POA: Diagnosis not present

## 2019-01-05 DIAGNOSIS — M6281 Muscle weakness (generalized): Secondary | ICD-10-CM | POA: Diagnosis not present

## 2019-01-06 DIAGNOSIS — E119 Type 2 diabetes mellitus without complications: Secondary | ICD-10-CM | POA: Diagnosis not present

## 2019-01-06 DIAGNOSIS — I10 Essential (primary) hypertension: Secondary | ICD-10-CM | POA: Diagnosis not present

## 2019-01-06 DIAGNOSIS — E782 Mixed hyperlipidemia: Secondary | ICD-10-CM | POA: Diagnosis not present

## 2019-01-06 DIAGNOSIS — E1142 Type 2 diabetes mellitus with diabetic polyneuropathy: Secondary | ICD-10-CM | POA: Diagnosis not present

## 2019-01-06 DIAGNOSIS — Z6841 Body Mass Index (BMI) 40.0 and over, adult: Secondary | ICD-10-CM | POA: Diagnosis not present

## 2019-01-06 DIAGNOSIS — Z86010 Personal history of colonic polyps: Secondary | ICD-10-CM | POA: Diagnosis not present

## 2019-01-06 DIAGNOSIS — Z23 Encounter for immunization: Secondary | ICD-10-CM | POA: Diagnosis not present

## 2019-01-06 DIAGNOSIS — Z125 Encounter for screening for malignant neoplasm of prostate: Secondary | ICD-10-CM | POA: Diagnosis not present

## 2019-01-07 DIAGNOSIS — R2689 Other abnormalities of gait and mobility: Secondary | ICD-10-CM | POA: Diagnosis not present

## 2019-01-07 DIAGNOSIS — M6281 Muscle weakness (generalized): Secondary | ICD-10-CM | POA: Diagnosis not present

## 2019-01-07 DIAGNOSIS — R262 Difficulty in walking, not elsewhere classified: Secondary | ICD-10-CM | POA: Diagnosis not present

## 2019-01-07 DIAGNOSIS — R2681 Unsteadiness on feet: Secondary | ICD-10-CM | POA: Diagnosis not present

## 2019-01-10 DIAGNOSIS — R2689 Other abnormalities of gait and mobility: Secondary | ICD-10-CM | POA: Diagnosis not present

## 2019-01-10 DIAGNOSIS — R2681 Unsteadiness on feet: Secondary | ICD-10-CM | POA: Diagnosis not present

## 2019-01-10 DIAGNOSIS — R262 Difficulty in walking, not elsewhere classified: Secondary | ICD-10-CM | POA: Diagnosis not present

## 2019-01-10 DIAGNOSIS — M6281 Muscle weakness (generalized): Secondary | ICD-10-CM | POA: Diagnosis not present

## 2019-01-12 DIAGNOSIS — R2689 Other abnormalities of gait and mobility: Secondary | ICD-10-CM | POA: Diagnosis not present

## 2019-01-12 DIAGNOSIS — R2681 Unsteadiness on feet: Secondary | ICD-10-CM | POA: Diagnosis not present

## 2019-01-12 DIAGNOSIS — R262 Difficulty in walking, not elsewhere classified: Secondary | ICD-10-CM | POA: Diagnosis not present

## 2019-01-12 DIAGNOSIS — M6281 Muscle weakness (generalized): Secondary | ICD-10-CM | POA: Diagnosis not present

## 2019-01-14 DIAGNOSIS — R2689 Other abnormalities of gait and mobility: Secondary | ICD-10-CM | POA: Diagnosis not present

## 2019-01-14 DIAGNOSIS — R262 Difficulty in walking, not elsewhere classified: Secondary | ICD-10-CM | POA: Diagnosis not present

## 2019-01-14 DIAGNOSIS — M6281 Muscle weakness (generalized): Secondary | ICD-10-CM | POA: Diagnosis not present

## 2019-01-14 DIAGNOSIS — R2681 Unsteadiness on feet: Secondary | ICD-10-CM | POA: Diagnosis not present

## 2019-01-18 DIAGNOSIS — R2681 Unsteadiness on feet: Secondary | ICD-10-CM | POA: Diagnosis not present

## 2019-01-18 DIAGNOSIS — R2689 Other abnormalities of gait and mobility: Secondary | ICD-10-CM | POA: Diagnosis not present

## 2019-01-18 DIAGNOSIS — R262 Difficulty in walking, not elsewhere classified: Secondary | ICD-10-CM | POA: Diagnosis not present

## 2019-01-18 DIAGNOSIS — M6281 Muscle weakness (generalized): Secondary | ICD-10-CM | POA: Diagnosis not present

## 2019-01-19 DIAGNOSIS — R262 Difficulty in walking, not elsewhere classified: Secondary | ICD-10-CM | POA: Diagnosis not present

## 2019-01-19 DIAGNOSIS — R2681 Unsteadiness on feet: Secondary | ICD-10-CM | POA: Diagnosis not present

## 2019-01-19 DIAGNOSIS — M6281 Muscle weakness (generalized): Secondary | ICD-10-CM | POA: Diagnosis not present

## 2019-01-19 DIAGNOSIS — R2689 Other abnormalities of gait and mobility: Secondary | ICD-10-CM | POA: Diagnosis not present

## 2019-01-20 DIAGNOSIS — R609 Edema, unspecified: Secondary | ICD-10-CM | POA: Diagnosis not present

## 2019-01-20 DIAGNOSIS — G3184 Mild cognitive impairment, so stated: Secondary | ICD-10-CM | POA: Diagnosis not present

## 2019-01-20 DIAGNOSIS — E785 Hyperlipidemia, unspecified: Secondary | ICD-10-CM | POA: Diagnosis not present

## 2019-01-20 DIAGNOSIS — G8929 Other chronic pain: Secondary | ICD-10-CM | POA: Diagnosis not present

## 2019-01-20 DIAGNOSIS — Z6841 Body Mass Index (BMI) 40.0 and over, adult: Secondary | ICD-10-CM | POA: Diagnosis not present

## 2019-01-20 DIAGNOSIS — M2550 Pain in unspecified joint: Secondary | ICD-10-CM | POA: Diagnosis not present

## 2019-01-20 DIAGNOSIS — Z7722 Contact with and (suspected) exposure to environmental tobacco smoke (acute) (chronic): Secondary | ICD-10-CM | POA: Diagnosis not present

## 2019-01-20 DIAGNOSIS — E1140 Type 2 diabetes mellitus with diabetic neuropathy, unspecified: Secondary | ICD-10-CM | POA: Diagnosis not present

## 2019-01-20 DIAGNOSIS — I10 Essential (primary) hypertension: Secondary | ICD-10-CM | POA: Diagnosis not present

## 2019-01-26 DIAGNOSIS — M6281 Muscle weakness (generalized): Secondary | ICD-10-CM | POA: Diagnosis not present

## 2019-01-26 DIAGNOSIS — R2689 Other abnormalities of gait and mobility: Secondary | ICD-10-CM | POA: Diagnosis not present

## 2019-01-26 DIAGNOSIS — R262 Difficulty in walking, not elsewhere classified: Secondary | ICD-10-CM | POA: Diagnosis not present

## 2019-01-26 DIAGNOSIS — R2681 Unsteadiness on feet: Secondary | ICD-10-CM | POA: Diagnosis not present

## 2019-01-27 DIAGNOSIS — R2689 Other abnormalities of gait and mobility: Secondary | ICD-10-CM | POA: Diagnosis not present

## 2019-01-27 DIAGNOSIS — M6281 Muscle weakness (generalized): Secondary | ICD-10-CM | POA: Diagnosis not present

## 2019-01-27 DIAGNOSIS — R262 Difficulty in walking, not elsewhere classified: Secondary | ICD-10-CM | POA: Diagnosis not present

## 2019-01-27 DIAGNOSIS — R2681 Unsteadiness on feet: Secondary | ICD-10-CM | POA: Diagnosis not present

## 2019-02-08 DIAGNOSIS — M6281 Muscle weakness (generalized): Secondary | ICD-10-CM | POA: Diagnosis not present

## 2019-02-08 DIAGNOSIS — R2689 Other abnormalities of gait and mobility: Secondary | ICD-10-CM | POA: Diagnosis not present

## 2019-02-08 DIAGNOSIS — R2681 Unsteadiness on feet: Secondary | ICD-10-CM | POA: Diagnosis not present

## 2019-02-08 DIAGNOSIS — R262 Difficulty in walking, not elsewhere classified: Secondary | ICD-10-CM | POA: Diagnosis not present

## 2019-02-10 DIAGNOSIS — M6281 Muscle weakness (generalized): Secondary | ICD-10-CM | POA: Diagnosis not present

## 2019-02-10 DIAGNOSIS — R2681 Unsteadiness on feet: Secondary | ICD-10-CM | POA: Diagnosis not present

## 2019-02-10 DIAGNOSIS — R2689 Other abnormalities of gait and mobility: Secondary | ICD-10-CM | POA: Diagnosis not present

## 2019-02-10 DIAGNOSIS — R262 Difficulty in walking, not elsewhere classified: Secondary | ICD-10-CM | POA: Diagnosis not present

## 2019-02-11 ENCOUNTER — Telehealth (INDEPENDENT_AMBULATORY_CARE_PROVIDER_SITE_OTHER): Payer: Self-pay | Admitting: Orthopaedic Surgery

## 2019-02-11 NOTE — Telephone Encounter (Signed)
I called and spoke with patient. He is needing a letter stating that he has been disabled x 3 years due to the conditions of his legs and is unable to work full time. This letter is needed to prove disability in which the government will do away with the taxes on his house.  Patient states he can pick up the note some time next week.  CB for patient is [phone]  Please advise.

## 2019-02-11 NOTE — Telephone Encounter (Signed)
Pt called asking about some paper work so he can prove that he is disabled.  Pt # [phone]

## 2019-02-11 NOTE — Telephone Encounter (Signed)
Ok to fix note I will sign. 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

## 2019-02-14 NOTE — Telephone Encounter (Signed)
Note entered. Awaiting Dr. Lorin Mercy signature.

## 2019-02-14 NOTE — Telephone Encounter (Signed)
Note at front for pick up. Patient advised.

## 2019-02-15 DIAGNOSIS — R2681 Unsteadiness on feet: Secondary | ICD-10-CM | POA: Diagnosis not present

## 2019-02-15 DIAGNOSIS — R2689 Other abnormalities of gait and mobility: Secondary | ICD-10-CM | POA: Diagnosis not present

## 2019-02-15 DIAGNOSIS — M6281 Muscle weakness (generalized): Secondary | ICD-10-CM | POA: Diagnosis not present

## 2019-02-15 DIAGNOSIS — R262 Difficulty in walking, not elsewhere classified: Secondary | ICD-10-CM | POA: Diagnosis not present

## 2019-02-17 DIAGNOSIS — M6281 Muscle weakness (generalized): Secondary | ICD-10-CM | POA: Diagnosis not present

## 2019-02-17 DIAGNOSIS — R2681 Unsteadiness on feet: Secondary | ICD-10-CM | POA: Diagnosis not present

## 2019-02-17 DIAGNOSIS — R262 Difficulty in walking, not elsewhere classified: Secondary | ICD-10-CM | POA: Diagnosis not present

## 2019-02-17 DIAGNOSIS — R2689 Other abnormalities of gait and mobility: Secondary | ICD-10-CM | POA: Diagnosis not present

## 2019-02-18 DIAGNOSIS — R69 Illness, unspecified: Secondary | ICD-10-CM | POA: Diagnosis not present

## 2019-02-22 DIAGNOSIS — M6281 Muscle weakness (generalized): Secondary | ICD-10-CM | POA: Diagnosis not present

## 2019-02-22 DIAGNOSIS — R2681 Unsteadiness on feet: Secondary | ICD-10-CM | POA: Diagnosis not present

## 2019-02-22 DIAGNOSIS — R2689 Other abnormalities of gait and mobility: Secondary | ICD-10-CM | POA: Diagnosis not present

## 2019-02-22 DIAGNOSIS — R262 Difficulty in walking, not elsewhere classified: Secondary | ICD-10-CM | POA: Diagnosis not present

## 2019-02-24 DIAGNOSIS — R2689 Other abnormalities of gait and mobility: Secondary | ICD-10-CM | POA: Diagnosis not present

## 2019-02-24 DIAGNOSIS — M6281 Muscle weakness (generalized): Secondary | ICD-10-CM | POA: Diagnosis not present

## 2019-02-24 DIAGNOSIS — R262 Difficulty in walking, not elsewhere classified: Secondary | ICD-10-CM | POA: Diagnosis not present

## 2019-02-24 DIAGNOSIS — R2681 Unsteadiness on feet: Secondary | ICD-10-CM | POA: Diagnosis not present

## 2019-03-01 DIAGNOSIS — R2689 Other abnormalities of gait and mobility: Secondary | ICD-10-CM | POA: Diagnosis not present

## 2019-03-01 DIAGNOSIS — R2681 Unsteadiness on feet: Secondary | ICD-10-CM | POA: Diagnosis not present

## 2019-03-01 DIAGNOSIS — M6281 Muscle weakness (generalized): Secondary | ICD-10-CM | POA: Diagnosis not present

## 2019-03-01 DIAGNOSIS — R262 Difficulty in walking, not elsewhere classified: Secondary | ICD-10-CM | POA: Diagnosis not present

## 2019-03-03 DIAGNOSIS — R262 Difficulty in walking, not elsewhere classified: Secondary | ICD-10-CM | POA: Diagnosis not present

## 2019-03-03 DIAGNOSIS — M6281 Muscle weakness (generalized): Secondary | ICD-10-CM | POA: Diagnosis not present

## 2019-03-03 DIAGNOSIS — R2681 Unsteadiness on feet: Secondary | ICD-10-CM | POA: Diagnosis not present

## 2019-03-03 DIAGNOSIS — R2689 Other abnormalities of gait and mobility: Secondary | ICD-10-CM | POA: Diagnosis not present

## 2019-03-25 ENCOUNTER — Ambulatory Visit: Payer: Medicare HMO | Admitting: Podiatry

## 2019-03-27 ENCOUNTER — Other Ambulatory Visit (INDEPENDENT_AMBULATORY_CARE_PROVIDER_SITE_OTHER): Payer: Self-pay | Admitting: Orthopaedic Surgery

## 2019-03-28 NOTE — Telephone Encounter (Signed)
Ok for refill? 

## 2019-03-30 ENCOUNTER — Encounter: Payer: Self-pay | Admitting: Sports Medicine

## 2019-03-30 ENCOUNTER — Other Ambulatory Visit: Payer: Self-pay

## 2019-03-30 ENCOUNTER — Ambulatory Visit: Payer: Medicare HMO | Admitting: Podiatry

## 2019-03-30 ENCOUNTER — Ambulatory Visit: Payer: Medicare HMO | Admitting: Sports Medicine

## 2019-03-30 VITALS — Temp 97.8°F | Resp 16

## 2019-03-30 DIAGNOSIS — M79675 Pain in left toe(s): Secondary | ICD-10-CM

## 2019-03-30 DIAGNOSIS — B351 Tinea unguium: Secondary | ICD-10-CM | POA: Diagnosis not present

## 2019-03-30 DIAGNOSIS — E1159 Type 2 diabetes mellitus with other circulatory complications: Secondary | ICD-10-CM | POA: Diagnosis not present

## 2019-03-30 DIAGNOSIS — M79674 Pain in right toe(s): Secondary | ICD-10-CM | POA: Diagnosis not present

## 2019-03-30 DIAGNOSIS — M21371 Foot drop, right foot: Secondary | ICD-10-CM

## 2019-03-30 DIAGNOSIS — L84 Corns and callosities: Secondary | ICD-10-CM

## 2019-03-30 NOTE — Progress Notes (Signed)
Subjective: [name] is a 58 y.o. male patient with a 20 year history of diabetes who presents to office today complaining of long,mildly painful nails  while ambulating in shoes; unable to trim and callus to toes. Patient states that the glucose reading this morning was 110 mg/dl, last A1c was 4.1. Patient last saw PCP Dr. Rex Kras in Jan. Patient denies any new changes in medication or new problems. Patient denies any new cramping, numbness, burning or tingling in the legs. Admits to a history of drop foot on the right due to back surgery.   Patient is assisted by daughter at this visit.  Patient Active Problem List   Diagnosis Date Noted  . Controlled type 2 diabetes mellitus without complication ([place]) 59/16/3846  . Chronic pain of left knee 05/27/2017  . Foot drop, right 05/27/2017  . Patella fracture 04/07/2016  . 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 acarbose (PRECOSE) 50 MG tablet     . amLODipine (NORVASC) 10 MG tablet Take 10 mg by mouth daily.  0  . aspirin 325 MG tablet Take 1 tablet (325 mg total) by mouth daily. 30 tablet 0  . Blood Glucose Monitoring Suppl (ONE TOUCH ULTRA 2) w/Device KIT     . doxycycline (VIBRA-TABS) 100 MG tablet     . glipiZIDE (GLUCOTROL XL) 10 MG 24 hr tablet Take 10 mg by mouth 2 (two) times daily.    . hydrochlorothiazide (HYDRODIURIL) 25 MG tablet   0  . ibuprofen (ADVIL,MOTRIN) 800 MG tablet TAKE 1 TABLET (800 MG TOTAL) BY MOUTH 2 (TWO) TIMES DAILY. 180 tablet 0  . lisinopril (PRINIVIL,ZESTRIL) 40 MG tablet Take 40 mg by mouth daily.  0  . metFORMIN (GLUCOPHAGE) 1000 MG tablet Take 1,000 mg by mouth 2 (two) times daily.  0  . methocarbamol (ROBAXIN) 500 MG tablet Take 1 tablet (500 mg total) by mouth every 6 (six) hours as needed for muscle spasms. 60 tablet 0  . neomycin-polymyxin b-dexamethasone (MAXITROL) 3.5-10000-0.1 OINT     . ONE TOUCH ULTRA TEST  test strip     . ONETOUCH DELICA LANCETS FINE MISC     . pioglitazone (ACTOS) 45 MG tablet Take 45 mg by mouth daily.  0  . pravastatin (PRAVACHOL) 10 MG tablet Take 10 mg by mouth daily.  1  . sulfamethoxazole-trimethoprim (BACTRIM DS,SEPTRA DS) 800-160 MG tablet      No current facility-administered medications on file prior to visit.    No Known Allergies  No results found for this or any previous visit (from the past 2160 hour(s)).  Objective: General: Patient is awake, alert, and oriented x 3 and in no acute distress.  Integument: Skin is warm, dry and supple bilateral. Nails are tender, long, thickened and  dystrophic with subungual debris, consistent with onychomycosis, 1-5 bilateral. No signs of infection. Reactive callus at distal tips of bilateral hallux and left 3rd toe without signs of infection. Remaining integument unremarkable.  Vasculature:  Dorsalis Pedis pulse 0/4 bilateral. Posterior Tibial pulse  0/4 bilateral.  Capillary fill time <3 sec 1-5 bilateral. No hair growth to the level of the digits. Temperature gradient within normal limits. ++ varicosities present bilateral. +1 pitting edema present bilateral and chronic trophic skin changes secondary to PVD.   Neurology: The patient has diminished sensation measured with a 5.07/10g Semmes Weinstein Monofilament at all pedal sites bilateral . Vibratory sensation diminished bilateral with  tuning fork. No Babinski sign present bilateral.   Musculoskeletal:Asymptomatic pes planus and hammertoe pedal deformities noted bilateral. Muscular strength 5/5 on left and 4/5 on right with history of drop foot. No pain with range of motion. No tenderness with calf compression bilateral.  Assessment and Plan: Problem List Items Addressed This Visit      Other   Foot drop, right    Other Visit Diagnoses    Pain due to onychomycosis of toenails of both feet    -  Primary   Callus       Type 2 diabetes mellitus with vascular disease  ([place])       Relevant Medications   acarbose (PRECOSE) 50 MG tablet     -Examined patient -Discussed and educated patient on diabetic foot care, especially with regards to the vascular, neurological and musculoskeletal systems -Stressed the importance of good glycemic control and the detriment of not controlling glucose levels in relation to the foot -Mechanically debrided all nails 1-5 bilateral using sterile nail nipper and filed with dremel without incident and smoothed callus at tips of toes with dremel without incident at no additional charge -Recommend daily skin emollients -Continue with AFO on right for drop foot -Answered all patient questions -Patient to return  in 3 months for at risk foot care -Patient advised to call the office if any problems or questions arise in the meantime.  Landis Martins, DPM

## 2019-04-20 DIAGNOSIS — E782 Mixed hyperlipidemia: Secondary | ICD-10-CM | POA: Diagnosis not present

## 2019-04-20 DIAGNOSIS — Z86010 Personal history of colonic polyps: Secondary | ICD-10-CM | POA: Diagnosis not present

## 2019-04-20 DIAGNOSIS — Z125 Encounter for screening for malignant neoplasm of prostate: Secondary | ICD-10-CM | POA: Diagnosis not present

## 2019-04-20 DIAGNOSIS — E1142 Type 2 diabetes mellitus with diabetic polyneuropathy: Secondary | ICD-10-CM | POA: Diagnosis not present

## 2019-04-20 DIAGNOSIS — Z23 Encounter for immunization: Secondary | ICD-10-CM | POA: Diagnosis not present

## 2019-04-20 DIAGNOSIS — E119 Type 2 diabetes mellitus without complications: Secondary | ICD-10-CM | POA: Diagnosis not present

## 2019-04-20 DIAGNOSIS — Z6841 Body Mass Index (BMI) 40.0 and over, adult: Secondary | ICD-10-CM | POA: Diagnosis not present

## 2019-04-20 DIAGNOSIS — I10 Essential (primary) hypertension: Secondary | ICD-10-CM | POA: Diagnosis not present

## 2019-05-19 DIAGNOSIS — R69 Illness, unspecified: Secondary | ICD-10-CM | POA: Diagnosis not present

## 2019-05-26 DIAGNOSIS — E7800 Pure hypercholesterolemia, unspecified: Secondary | ICD-10-CM | POA: Diagnosis not present

## 2019-05-26 DIAGNOSIS — E1165 Type 2 diabetes mellitus with hyperglycemia: Secondary | ICD-10-CM | POA: Diagnosis not present

## 2019-05-26 DIAGNOSIS — I10 Essential (primary) hypertension: Secondary | ICD-10-CM | POA: Diagnosis not present

## 2019-06-08 ENCOUNTER — Telehealth: Payer: Self-pay | Admitting: Sports Medicine

## 2019-06-08 NOTE — Telephone Encounter (Signed)
pts son left message for me to call him back to see if and when pt qualifies for a new drop foot brace.   I returned call and per Rick's note last year when he was examined pt got the brace from [facility] in 2018 so he will qualify to get a new one next yr. Pt stated the brace he got does not work any longer because it is too big and came loose from his shoe. It has caused a cut on leg previously. I told pts son to possibly contact [facility]otech to see if they can help until pt can get a new one. Pts son asked if he could get one next yr at our office and I told him at the last appt of the yr to make an appt with Dr Cannon Kettle and Liliane Channel on the same day in [place] office for next yr and we will see what we can do for him. The son said thank you for all the information and calling him back.

## 2019-06-19 ENCOUNTER — Other Ambulatory Visit (INDEPENDENT_AMBULATORY_CARE_PROVIDER_SITE_OTHER): Payer: Self-pay | Admitting: Orthopaedic Surgery

## 2019-06-20 NOTE — Telephone Encounter (Signed)
Ok to r?

## 2019-06-29 ENCOUNTER — Ambulatory Visit: Payer: Medicare HMO | Admitting: Sports Medicine

## 2019-07-15 DIAGNOSIS — E1165 Type 2 diabetes mellitus with hyperglycemia: Secondary | ICD-10-CM | POA: Diagnosis not present

## 2019-07-15 DIAGNOSIS — Z6839 Body mass index (BMI) 39.0-39.9, adult: Secondary | ICD-10-CM | POA: Diagnosis not present

## 2019-07-15 DIAGNOSIS — I10 Essential (primary) hypertension: Secondary | ICD-10-CM | POA: Diagnosis not present

## 2019-07-15 DIAGNOSIS — M21371 Foot drop, right foot: Secondary | ICD-10-CM | POA: Diagnosis not present

## 2019-07-15 DIAGNOSIS — Z86010 Personal history of colonic polyps: Secondary | ICD-10-CM | POA: Diagnosis not present

## 2019-07-15 DIAGNOSIS — H53009 Unspecified amblyopia, unspecified eye: Secondary | ICD-10-CM | POA: Diagnosis not present

## 2019-07-15 DIAGNOSIS — E782 Mixed hyperlipidemia: Secondary | ICD-10-CM | POA: Diagnosis not present

## 2019-07-15 DIAGNOSIS — Z125 Encounter for screening for malignant neoplasm of prostate: Secondary | ICD-10-CM | POA: Diagnosis not present

## 2019-07-20 DIAGNOSIS — E782 Mixed hyperlipidemia: Secondary | ICD-10-CM | POA: Diagnosis not present

## 2019-07-20 DIAGNOSIS — I10 Essential (primary) hypertension: Secondary | ICD-10-CM | POA: Diagnosis not present

## 2019-07-20 DIAGNOSIS — E1142 Type 2 diabetes mellitus with diabetic polyneuropathy: Secondary | ICD-10-CM | POA: Diagnosis not present

## 2019-07-20 DIAGNOSIS — E119 Type 2 diabetes mellitus without complications: Secondary | ICD-10-CM | POA: Diagnosis not present

## 2019-07-20 DIAGNOSIS — E1165 Type 2 diabetes mellitus with hyperglycemia: Secondary | ICD-10-CM | POA: Diagnosis not present

## 2019-07-28 ENCOUNTER — Ambulatory Visit: Payer: Medicare HMO | Admitting: Sports Medicine

## 2019-08-24 ENCOUNTER — Ambulatory Visit: Payer: Medicare HMO | Admitting: Sports Medicine

## 2019-09-07 ENCOUNTER — Other Ambulatory Visit (INDEPENDENT_AMBULATORY_CARE_PROVIDER_SITE_OTHER): Payer: Self-pay | Admitting: Orthopaedic Surgery

## 2019-09-07 NOTE — Telephone Encounter (Signed)
Please advise 

## 2019-09-28 DIAGNOSIS — Z23 Encounter for immunization: Secondary | ICD-10-CM | POA: Diagnosis not present

## 2019-11-02 DIAGNOSIS — E782 Mixed hyperlipidemia: Secondary | ICD-10-CM | POA: Diagnosis not present

## 2019-11-02 DIAGNOSIS — E119 Type 2 diabetes mellitus without complications: Secondary | ICD-10-CM | POA: Diagnosis not present

## 2019-11-02 DIAGNOSIS — I10 Essential (primary) hypertension: Secondary | ICD-10-CM | POA: Diagnosis not present

## 2019-11-02 DIAGNOSIS — E1165 Type 2 diabetes mellitus with hyperglycemia: Secondary | ICD-10-CM | POA: Diagnosis not present

## 2019-11-02 DIAGNOSIS — E1142 Type 2 diabetes mellitus with diabetic polyneuropathy: Secondary | ICD-10-CM | POA: Diagnosis not present

## 2019-11-13 ENCOUNTER — Other Ambulatory Visit (INDEPENDENT_AMBULATORY_CARE_PROVIDER_SITE_OTHER): Payer: Self-pay | Admitting: Orthopaedic Surgery

## 2019-11-14 NOTE — Telephone Encounter (Signed)
Please advise 

## 2019-11-16 DIAGNOSIS — E119 Type 2 diabetes mellitus without complications: Secondary | ICD-10-CM | POA: Diagnosis not present

## 2019-11-16 DIAGNOSIS — E1165 Type 2 diabetes mellitus with hyperglycemia: Secondary | ICD-10-CM | POA: Diagnosis not present

## 2019-11-16 DIAGNOSIS — I10 Essential (primary) hypertension: Secondary | ICD-10-CM | POA: Diagnosis not present

## 2019-11-16 DIAGNOSIS — E782 Mixed hyperlipidemia: Secondary | ICD-10-CM | POA: Diagnosis not present

## 2019-11-16 DIAGNOSIS — E1142 Type 2 diabetes mellitus with diabetic polyneuropathy: Secondary | ICD-10-CM | POA: Diagnosis not present

## 2019-12-15 DIAGNOSIS — I10 Essential (primary) hypertension: Secondary | ICD-10-CM | POA: Diagnosis not present

## 2019-12-15 DIAGNOSIS — E782 Mixed hyperlipidemia: Secondary | ICD-10-CM | POA: Diagnosis not present

## 2019-12-15 DIAGNOSIS — E1142 Type 2 diabetes mellitus with diabetic polyneuropathy: Secondary | ICD-10-CM | POA: Diagnosis not present

## 2019-12-15 DIAGNOSIS — E1165 Type 2 diabetes mellitus with hyperglycemia: Secondary | ICD-10-CM | POA: Diagnosis not present

## 2019-12-15 DIAGNOSIS — E119 Type 2 diabetes mellitus without complications: Secondary | ICD-10-CM | POA: Diagnosis not present

## 2020-01-12 DIAGNOSIS — E1165 Type 2 diabetes mellitus with hyperglycemia: Secondary | ICD-10-CM | POA: Diagnosis not present

## 2020-01-12 DIAGNOSIS — I10 Essential (primary) hypertension: Secondary | ICD-10-CM | POA: Diagnosis not present

## 2020-01-12 DIAGNOSIS — E119 Type 2 diabetes mellitus without complications: Secondary | ICD-10-CM | POA: Diagnosis not present

## 2020-01-12 DIAGNOSIS — E782 Mixed hyperlipidemia: Secondary | ICD-10-CM | POA: Diagnosis not present

## 2020-01-12 DIAGNOSIS — E1142 Type 2 diabetes mellitus with diabetic polyneuropathy: Secondary | ICD-10-CM | POA: Diagnosis not present

## 2020-01-16 DIAGNOSIS — E1165 Type 2 diabetes mellitus with hyperglycemia: Secondary | ICD-10-CM | POA: Diagnosis not present

## 2020-01-23 DIAGNOSIS — Z6838 Body mass index (BMI) 38.0-38.9, adult: Secondary | ICD-10-CM | POA: Diagnosis not present

## 2020-01-23 DIAGNOSIS — E1142 Type 2 diabetes mellitus with diabetic polyneuropathy: Secondary | ICD-10-CM | POA: Diagnosis not present

## 2020-01-23 DIAGNOSIS — G3184 Mild cognitive impairment, so stated: Secondary | ICD-10-CM | POA: Diagnosis not present

## 2020-01-23 DIAGNOSIS — M1990 Unspecified osteoarthritis, unspecified site: Secondary | ICD-10-CM | POA: Diagnosis not present

## 2020-01-23 DIAGNOSIS — Z791 Long term (current) use of non-steroidal anti-inflammatories (NSAID): Secondary | ICD-10-CM | POA: Diagnosis not present

## 2020-01-23 DIAGNOSIS — G8929 Other chronic pain: Secondary | ICD-10-CM | POA: Diagnosis not present

## 2020-01-23 DIAGNOSIS — R609 Edema, unspecified: Secondary | ICD-10-CM | POA: Diagnosis not present

## 2020-01-23 DIAGNOSIS — E785 Hyperlipidemia, unspecified: Secondary | ICD-10-CM | POA: Diagnosis not present

## 2020-01-23 DIAGNOSIS — I10 Essential (primary) hypertension: Secondary | ICD-10-CM | POA: Diagnosis not present

## 2020-02-06 ENCOUNTER — Other Ambulatory Visit (INDEPENDENT_AMBULATORY_CARE_PROVIDER_SITE_OTHER): Payer: Self-pay | Admitting: Orthopaedic Surgery

## 2020-02-06 NOTE — Telephone Encounter (Signed)
Please advise 

## 2020-03-14 ENCOUNTER — Telehealth: Payer: Self-pay | Admitting: Orthopaedic Surgery

## 2020-03-14 NOTE — Telephone Encounter (Signed)
Ok to bring patient in for appointment with you for new rx for brace?

## 2020-03-14 NOTE — Telephone Encounter (Signed)
Pt called in stating he used to see Dr. Lorin Mercy but no longer does; pt states it time for him to get a new brace for his root foot(he has drop foot) and he's not sure who else he should be asking.   [phone]

## 2020-03-14 NOTE — Telephone Encounter (Signed)
U can call and order brace from same place he last got it if that is who he wants to use. Can send to [facility] or [facility] his choice.

## 2020-03-15 ENCOUNTER — Ambulatory Visit: Payer: Medicare HMO | Attending: Internal Medicine

## 2020-03-15 DIAGNOSIS — Z23 Encounter for immunization: Secondary | ICD-10-CM

## 2020-03-15 NOTE — Progress Notes (Signed)
   Covid-19 Vaccination Clinic  Name:  [name]    MRN: [id number] DOB: Feb 01, 1961  03/15/2020  [name] was observed post Covid-19 immunization for 15 minutes without incident. He was provided with Vaccine Information Sheet and instruction to access the V-Safe system.   [name] was instructed to call 911 with any severe reactions post vaccine: Marland Kitchen Difficulty breathing  . Swelling of face and throat  . A fast heartbeat  . A bad rash all over body  . Dizziness and weakness   Immunizations Administered    Name Date Dose VIS Date Route   Pfizer COVID-19 Vaccine 03/15/2020  1:44 PM 0.3 mL 11/25/2019 Intramuscular   Manufacturer: [place]   Lot: [id number]   [place]: [id number]    pt reported no issues

## 2020-03-15 NOTE — Telephone Encounter (Signed)
I called patient with no answer. Will call again Monday.

## 2020-03-19 NOTE — Telephone Encounter (Signed)
I left voicemail for patient requesting return call in regards to brace that he needs. I explained Dr. Lorin Mercy will write rx for brace if it is the same one he had previously and asked that he specify whether he would like to use [facility] or [facility]. Once patient calls with that information, I can get rx ready for pick up.

## 2020-03-20 ENCOUNTER — Other Ambulatory Visit: Payer: Medicare HMO | Admitting: Orthotics

## 2020-04-09 ENCOUNTER — Other Ambulatory Visit: Payer: Medicare HMO | Admitting: Orthotics

## 2020-04-09 ENCOUNTER — Ambulatory Visit: Payer: Medicare Other | Attending: Internal Medicine

## 2020-04-09 DIAGNOSIS — Z23 Encounter for immunization: Secondary | ICD-10-CM

## 2020-04-09 NOTE — Progress Notes (Signed)
   Covid-19 Vaccination Clinic  Name:  [name]    MRN: [id number] DOB: [birth year]  04/09/2020  [name] was observed post Covid-19 immunization for 15 minutes without incident. He was provided with Vaccine Information Sheet and instruction to access the V-Safe system.   [name] was instructed to call 911 with any severe reactions post vaccine: Marland Kitchen Difficulty breathing  . Swelling of face and throat  . A fast heartbeat  . A bad rash all over body  . Dizziness and weakness   Immunizations Administered    Name Date Dose VIS Date Route   Pfizer COVID-19 Vaccine 04/09/2020  1:04 PM 0.3 mL 02/08/2019 Intramuscular   Manufacturer: [place]   Lot: [id number]   [place]: [id number]

## 2020-04-11 ENCOUNTER — Other Ambulatory Visit: Payer: Medicare Other | Admitting: Orthotics

## 2020-04-25 ENCOUNTER — Other Ambulatory Visit: Payer: Medicare Other | Admitting: Orthotics

## 2020-05-15 ENCOUNTER — Other Ambulatory Visit: Payer: Medicare Other | Admitting: Orthotics

## 2020-05-15 ENCOUNTER — Other Ambulatory Visit: Payer: Self-pay

## 2020-05-17 ENCOUNTER — Other Ambulatory Visit: Payer: Self-pay

## 2020-05-17 ENCOUNTER — Ambulatory Visit: Payer: Medicare Other | Admitting: Podiatry

## 2020-05-17 ENCOUNTER — Encounter: Payer: Self-pay | Admitting: Podiatry

## 2020-05-17 DIAGNOSIS — M21371 Foot drop, right foot: Secondary | ICD-10-CM

## 2020-05-17 DIAGNOSIS — E1159 Type 2 diabetes mellitus with other circulatory complications: Secondary | ICD-10-CM

## 2020-05-17 DIAGNOSIS — B351 Tinea unguium: Secondary | ICD-10-CM

## 2020-05-17 DIAGNOSIS — M2041 Other hammer toe(s) (acquired), right foot: Secondary | ICD-10-CM | POA: Diagnosis not present

## 2020-05-17 DIAGNOSIS — E119 Type 2 diabetes mellitus without complications: Secondary | ICD-10-CM

## 2020-05-17 DIAGNOSIS — M79675 Pain in left toe(s): Secondary | ICD-10-CM

## 2020-05-17 DIAGNOSIS — L84 Corns and callosities: Secondary | ICD-10-CM

## 2020-05-17 DIAGNOSIS — M79674 Pain in right toe(s): Secondary | ICD-10-CM

## 2020-05-17 DIAGNOSIS — M2042 Other hammer toe(s) (acquired), left foot: Secondary | ICD-10-CM

## 2020-05-22 NOTE — Progress Notes (Signed)
Subjective: [name] presents today preventative diabetic foot care and painful mycotic nails b/l that are difficult to trim. Pain interferes with ambulation. Aggravating factors include wearing enclosed shoe gear. Pain is relieved with periodic professional debridement.   Patient also requesting diabetic shoes on today's visit. He has old AFO for foot drop of RLE which is several years old and would like a new one as well.   His daughter, Lenna Sciara,  is present during today's visit.   [name], MD is patient's PCP. Last visit was: one month ago per Mr. Trostel.Marland Kitchen  Past Medical History:  Diagnosis Date  . Arthritis   . Diabetes mellitus without complication ([place])   . Foot drop, right   . Hypertension   . Neuropathy    due to diabetes  . Wears glasses      Current Outpatient Medications on File Prior to Visit  Medication Sig Dispense Refill  . acarbose (PRECOSE) 50 MG tablet     . amLODipine (NORVASC) 10 MG tablet Take 10 mg by mouth daily.  0  . aspirin 325 MG tablet Take 1 tablet (325 mg total) by mouth daily. 30 tablet 0  . Blood Glucose Monitoring Suppl (ONE TOUCH ULTRA 2) w/Device KIT     . doxycycline (VIBRA-TABS) 100 MG tablet     . glipiZIDE (GLUCOTROL XL) 10 MG 24 hr tablet Take 10 mg by mouth 2 (two) times daily.    . hydrochlorothiazide (HYDRODIURIL) 25 MG tablet   0  . ibuprofen (ADVIL) 800 MG tablet TAKE 1 TABLET BY MOUTH 2 TIMES DAILY. 180 tablet 0  . lisinopril (PRINIVIL,ZESTRIL) 40 MG tablet Take 40 mg by mouth daily.  0  . metFORMIN (GLUCOPHAGE) 1000 MG tablet Take 1,000 mg by mouth 2 (two) times daily.  0  . methocarbamol (ROBAXIN) 500 MG tablet Take 1 tablet (500 mg total) by mouth every 6 (six) hours as needed for muscle spasms. 60 tablet 0  . neomycin-polymyxin b-dexamethasone (MAXITROL) 3.5-10000-0.1 OINT     . ONE TOUCH ULTRA TEST test strip     . ONETOUCH DELICA LANCETS FINE MISC     . pioglitazone (ACTOS) 45 MG tablet Take 45 mg by mouth daily.  0  .  pravastatin (PRAVACHOL) 10 MG tablet Take 10 mg by mouth daily.  1  . sulfamethoxazole-trimethoprim (BACTRIM DS,SEPTRA DS) 800-160 MG tablet      No current facility-administered medications on file prior to visit.     No Known Allergies  Objective: ANDREW BLASIUS is a pleasant 59 y.o. y.o. Patient Race: White or Caucasian [1]  male in NAD. AAO x 3.  There were no vitals filed for this visit.  Vascular Examination: Capillary fill time to digits <3 seconds b/l lower extremities. Palpable DP pulses b/l. Palpable PT pulses b/l. Nonpalpable DP pulse(s) b/l lower extremities. Nonpalpable PT pulse(s) b/l lower extremities. Pedal hair absent b/l. Skin temperature gradient within normal limits b/l. +1 pitting edema b/l lower extremities. Varicosities present b/l.  Dermatological Examination: Pedal skin with normal turgor, texture and tone bilaterally. No open wounds bilaterally. No interdigital macerations bilaterally. Toenails 1-5 b/l elongated, discolored, dystrophic, thickened, crumbly with subungual debris and tenderness to dorsal palpation. Hyperkeratotic lesion(s) L hallux, L 3rd toe and R hallux.  No erythema, no edema, no drainage, no flocculence.  Musculoskeletal: Normal muscle strength 5/5 to all lower extremity muscle groups of LLE. Muscle strength 5/5 plantarflexion, inversion and eversion of RLE.  Dropfoot noted RLE with dorsiflexion 0/5. No pain  crepitus or joint limitation noted with ROM b/l. Hammertoes noted to the L hallux, L 3rd toe and R hallux. Pes planus deformity noted b/l.   Neurological Examination: Protective sensation diminished with 10g monofilament b/l. Vibratory sensation diminished b/l. Proprioception intact bilaterally. Babinski reflex negative b/l. Clonus negative b/l.  Assessment: 1. Pain due to onychomycosis of toenails of both feet   2. Corns and callosities   3. Foot drop, right   4. Acquired hammertoes of both feet   5. Type 2 diabetes mellitus with  vascular disease ([place])   6. Encounter for diabetic foot exam ([place])   Diabetic Risk Categorization: High Risk  Patient has one or more of the following: Loss of protective sensation Absent pedal pulses Severe Foot deformity History of foot ulcer Plan: -Examined patient. -Diabetic foot examination performed on today's visit. -Toenails 1-5 b/l were debrided in length and girth with sterile nail nippers and dremel without iatrogenic bleeding.  -Corn(s) L 3rd toe and callus(es) L hallux and R hallux were pared utilizing sterile scalpel blade without incident. Total number debrided =3. -Patient to continue soft, supportive shoe gear daily. Start procedure for diabetic shoes. Patient qualifies based on diagnoses of NIDDM with PAD, hammertoes, pes planus, +LOPS -Patient to report any pedal injuries to medical professional immediately. -Patient to be assessed by Pedorthist for new AFO for drop foot of RLE. -Patient/POA to call should there be question/concern in the interim.  Return in about 3 months (around 08/17/2020).  Marzetta Board, DPM

## 2020-06-19 ENCOUNTER — Other Ambulatory Visit: Payer: Self-pay

## 2020-06-19 ENCOUNTER — Other Ambulatory Visit: Payer: Medicare Other | Admitting: Orthotics

## 2020-07-19 ENCOUNTER — Telehealth: Payer: Self-pay | Admitting: Podiatry

## 2020-07-19 NOTE — Telephone Encounter (Signed)
Pt left 2 messages asking if his brace is back yet.  I returned call and left message it is not back but estimated to be in 8.10 and to call and I can get him scheduled to pick it up.Marland KitchenMarland Kitchen

## 2020-07-31 ENCOUNTER — Other Ambulatory Visit: Payer: Medicare Other | Admitting: Orthotics

## 2020-07-31 ENCOUNTER — Other Ambulatory Visit: Payer: Self-pay

## 2020-07-31 DIAGNOSIS — M21371 Foot drop, right foot: Secondary | ICD-10-CM

## 2020-07-31 DIAGNOSIS — E1159 Type 2 diabetes mellitus with other circulatory complications: Secondary | ICD-10-CM | POA: Diagnosis not present

## 2020-08-08 ENCOUNTER — Telehealth: Payer: Self-pay | Admitting: Podiatry

## 2020-08-08 NOTE — Telephone Encounter (Signed)
Pt left message yesterday@ 129pm asking if diabetic shoes.  I returned call and explained we have not heard back from pt or daughter with the new endocrinologist name so I can send the paperwork to them. He is going to find out the doctors name and call me back.

## 2020-08-08 NOTE — Telephone Encounter (Signed)
Pt called back and told me he seen Dr Chalmers Cater. I told pt I would order them and send them the paperwork.

## 2020-08-30 ENCOUNTER — Other Ambulatory Visit: Payer: Self-pay

## 2020-08-30 ENCOUNTER — Encounter: Payer: Self-pay | Admitting: Podiatry

## 2020-08-30 ENCOUNTER — Ambulatory Visit: Payer: Medicare Other | Admitting: Podiatry

## 2020-08-30 DIAGNOSIS — M79674 Pain in right toe(s): Secondary | ICD-10-CM

## 2020-08-30 DIAGNOSIS — L84 Corns and callosities: Secondary | ICD-10-CM

## 2020-08-30 DIAGNOSIS — M79675 Pain in left toe(s): Secondary | ICD-10-CM | POA: Diagnosis not present

## 2020-08-30 DIAGNOSIS — B351 Tinea unguium: Secondary | ICD-10-CM | POA: Diagnosis not present

## 2020-08-30 DIAGNOSIS — M21371 Foot drop, right foot: Secondary | ICD-10-CM

## 2020-08-30 DIAGNOSIS — E1159 Type 2 diabetes mellitus with other circulatory complications: Secondary | ICD-10-CM

## 2020-08-30 DIAGNOSIS — M2042 Other hammer toe(s) (acquired), left foot: Secondary | ICD-10-CM

## 2020-08-30 DIAGNOSIS — M2041 Other hammer toe(s) (acquired), right foot: Secondary | ICD-10-CM

## 2020-09-02 NOTE — Progress Notes (Signed)
Subjective: [name] presents today preventative diabetic foot care and painful mycotic nails b/l that are difficult to trim. Pain interferes with ambulation. Aggravating factors include wearing enclosed shoe gear. Pain is relieved with periodic professional debridement.   Patient also requesting diabetic shoes on today's visit. He has old AFO for foot drop of RLE which is several years old and would like a new one as well.   He states his daughter is not feeling well on today. She usually accompanies him to his doctor's visit.  [name], MD is patient's PCP. Last visit was: May, 2021.  He is also here to pick up his diabetic shoes on today's visit.  Past Medical History:  Diagnosis Date  . Arthritis   . Diabetes mellitus without complication ([place])   . Foot drop, right   . Hypertension   . Neuropathy    due to diabetes  . Wears glasses      Current Outpatient Medications on File Prior to Visit  Medication Sig Dispense Refill  . acarbose (PRECOSE) 50 MG tablet     . amLODipine (NORVASC) 10 MG tablet Take 10 mg by mouth daily.  0  . aspirin 325 MG tablet Take 1 tablet (325 mg total) by mouth daily. 30 tablet 0  . Blood Glucose Monitoring Suppl (ONE TOUCH ULTRA 2) w/Device KIT     . doxycycline (VIBRA-TABS) 100 MG tablet     . glipiZIDE (GLUCOTROL XL) 10 MG 24 hr tablet Take 10 mg by mouth 2 (two) times daily.    . hydrochlorothiazide (HYDRODIURIL) 25 MG tablet   0  . ibuprofen (ADVIL) 800 MG tablet TAKE 1 TABLET BY MOUTH 2 TIMES DAILY. 180 tablet 0  . lisinopril (PRINIVIL,ZESTRIL) 40 MG tablet Take 40 mg by mouth daily.  0  . metFORMIN (GLUCOPHAGE) 1000 MG tablet Take 1,000 mg by mouth 2 (two) times daily.  0  . methocarbamol (ROBAXIN) 500 MG tablet Take 1 tablet (500 mg total) by mouth every 6 (six) hours as needed for muscle spasms. 60 tablet 0  . neomycin-polymyxin b-dexamethasone (MAXITROL) 3.5-10000-0.1 OINT     . ONE TOUCH ULTRA TEST test strip     . ONETOUCH DELICA  LANCETS FINE MISC     . pioglitazone (ACTOS) 45 MG tablet Take 45 mg by mouth daily.  0  . pravastatin (PRAVACHOL) 10 MG tablet Take 10 mg by mouth daily.  1  . sulfamethoxazole-trimethoprim (BACTRIM DS,SEPTRA DS) 800-160 MG tablet      No current facility-administered medications on file prior to visit.     No Known Allergies  Objective: [name] is a pleasant 59 y.o. Caucaisan male, morbidly obese, in NAD. AAO x 3.  There were no vitals filed for this visit.  Vascular Examination: Capillary fill time to digits <3 seconds b/l lower extremities. Palpable DP pulses b/l. Palpable PT pulses b/l. Nonpalpable DP pulse(s) b/l lower extremities. Nonpalpable PT pulse(s) b/l lower extremities. Pedal hair absent b/l. Skin temperature gradient within normal limits b/l. +1 pitting edema b/l lower extremities. Varicosities present b/l.  Dermatological Examination: Pedal skin with normal turgor, texture and tone bilaterally. No open wounds bilaterally. No interdigital macerations bilaterally. Toenails 1-5 b/l elongated, discolored, dystrophic, thickened, crumbly with subungual debris and tenderness to dorsal palpation. Hyperkeratotic lesion(s) L hallux, L 3rd toe and R hallux.  No erythema, no edema, no drainage, no flocculence.  Musculoskeletal: Normal muscle strength 5/5 to all lower extremity muscle groups of LLE. Muscle strength 5/5 plantarflexion, inversion  and eversion of RLE.  Dropfoot noted RLE with dorsiflexion 0/5. No pain crepitus or joint limitation noted with ROM b/l. Hammertoes noted to the L hallux, L 3rd toe and R hallux. Pes planus deformity noted b/l.   Neurological Examination: Protective sensation diminished with 10g monofilament b/l. Vibratory sensation diminished b/l. Proprioception intact bilaterally. Babinski reflex negative b/l. Clonus negative b/l.  Assessment: 1. Pain due to onychomycosis of toenails of both feet   2. Corns and callosities   3. Foot drop, right   4.  Acquired hammertoes of both feet   5. Type 2 diabetes mellitus with vascular disease ([facility])    Diabetic Risk Categorization: High Risk  Patient has one or more of the following: Loss of protective sensation Absent pedal pulses Severe Foot deformity History of foot ulcer Plan: -Examined patient. -Spoke to Dawn Miner in our [place] office. Patient's diabetic shoes did arrive, but without necessary inserts. We will call him when the inserts arrive and will dispense both on the same day. He related understanding. -Continue diabetic foot care principles. -Toenails 1-5 b/l were debrided in length and girth with sterile nail nippers and dremel without iatrogenic bleeding.  -Corn(s) L 3rd toe and callus(es) L hallux and R hallux were pared utilizing sterile scalpel blade without incident. Total number debrided =3. -Patient is awaiting delivery of new diabetic shoes. Patient qualifies based on diagnoses of NIDDM with PAD, hammertoes, pes planus, +LOPS. -Patient to report any pedal injuries to medical professional immediately. -Patient/POA to call should there be question/concern in the interim.  Return in about 3 months (around 11/29/2020).   L , DPM 

## 2020-09-07 ENCOUNTER — Other Ambulatory Visit: Payer: Self-pay

## 2020-09-07 ENCOUNTER — Ambulatory Visit (INDEPENDENT_AMBULATORY_CARE_PROVIDER_SITE_OTHER): Payer: Medicare Other | Admitting: Orthotics

## 2020-09-07 DIAGNOSIS — M2041 Other hammer toe(s) (acquired), right foot: Secondary | ICD-10-CM

## 2020-09-07 DIAGNOSIS — E1159 Type 2 diabetes mellitus with other circulatory complications: Secondary | ICD-10-CM

## 2020-09-07 DIAGNOSIS — M79674 Pain in right toe(s): Secondary | ICD-10-CM

## 2020-09-07 DIAGNOSIS — M21371 Foot drop, right foot: Secondary | ICD-10-CM

## 2020-09-07 DIAGNOSIS — M2042 Other hammer toe(s) (acquired), left foot: Secondary | ICD-10-CM

## 2020-09-07 DIAGNOSIS — L84 Corns and callosities: Secondary | ICD-10-CM

## 2020-09-07 DIAGNOSIS — B351 Tinea unguium: Secondary | ICD-10-CM

## 2020-11-29 ENCOUNTER — Encounter: Payer: Self-pay | Admitting: Podiatry

## 2020-11-29 ENCOUNTER — Other Ambulatory Visit: Payer: Self-pay

## 2020-11-29 ENCOUNTER — Ambulatory Visit: Payer: Medicare Other | Admitting: Podiatry

## 2020-11-29 DIAGNOSIS — E7800 Pure hypercholesterolemia, unspecified: Secondary | ICD-10-CM | POA: Insufficient documentation

## 2020-11-29 DIAGNOSIS — M79675 Pain in left toe(s): Secondary | ICD-10-CM | POA: Diagnosis not present

## 2020-11-29 DIAGNOSIS — E1165 Type 2 diabetes mellitus with hyperglycemia: Secondary | ICD-10-CM | POA: Insufficient documentation

## 2020-11-29 DIAGNOSIS — L84 Corns and callosities: Secondary | ICD-10-CM

## 2020-11-29 DIAGNOSIS — B351 Tinea unguium: Secondary | ICD-10-CM | POA: Diagnosis not present

## 2020-11-29 DIAGNOSIS — M79674 Pain in right toe(s): Secondary | ICD-10-CM | POA: Diagnosis not present

## 2020-11-29 DIAGNOSIS — E1159 Type 2 diabetes mellitus with other circulatory complications: Secondary | ICD-10-CM | POA: Diagnosis not present

## 2020-11-29 DIAGNOSIS — I10 Essential (primary) hypertension: Secondary | ICD-10-CM | POA: Insufficient documentation

## 2020-11-29 NOTE — Patient Instructions (Signed)
Apply Neosporin to right great toe once daily for one week.

## 2020-12-02 NOTE — Progress Notes (Signed)
Subjective: [name] presents today preventative diabetic foot care and painful mycotic nails b/l that are difficult to trim. Pain interferes with ambulation. Aggravating factors include wearing enclosed shoe gear. Pain is relieved with periodic professional debridement.   Patient also requesting diabetic shoes on today's visit. He has old AFO for foot drop of RLE which is several years old and would like a new one as well.   He has his new diabetic shoes and voices no issues with them.   He voices no new pedal problems on today's visit. States his blood glucose was 110 mg/dl yesterday.  [name], MD is patient's PCP. Last visit was: yesterday.   Past Medical History:  Diagnosis Date  . Arthritis   . Diabetes mellitus without complication ([place])   . Foot drop, right   . Hypertension   . Neuropathy    due to diabetes  . Wears glasses      Current Outpatient Medications on File Prior to Visit  Medication Sig Dispense Refill  . acarbose (PRECOSE) 50 MG tablet     . amLODipine (NORVASC) 10 MG tablet Take 10 mg by mouth daily.  0  . aspirin 325 MG tablet Take 1 tablet (325 mg total) by mouth daily. 30 tablet 0  . Blood Glucose Monitoring Suppl (ONE TOUCH ULTRA 2) w/Device KIT     . glipiZIDE (GLUCOTROL XL) 10 MG 24 hr tablet Take 10 mg by mouth 2 (two) times daily.    . hydrochlorothiazide (HYDRODIURIL) 25 MG tablet   0  . ibuprofen (ADVIL) 800 MG tablet TAKE 1 TABLET BY MOUTH 2 TIMES DAILY. 180 tablet 0  . lisinopril (PRINIVIL,ZESTRIL) 40 MG tablet Take 40 mg by mouth daily.  0  . metFORMIN (GLUCOPHAGE) 1000 MG tablet Take 1,000 mg by mouth 2 (two) times daily.  0  . methocarbamol (ROBAXIN) 500 MG tablet Take 1 tablet (500 mg total) by mouth every 6 (six) hours as needed for muscle spasms. 60 tablet 0  . ONE TOUCH ULTRA TEST test strip     . ONETOUCH DELICA LANCETS FINE MISC     . pioglitazone (ACTOS) 45 MG tablet Take 45 mg by mouth daily.  0  . pravastatin (PRAVACHOL) 10  MG tablet Take 10 mg by mouth daily.  1   No current facility-administered medications on file prior to visit.     No Known Allergies  Objective: [name] is a pleasant 59 y.o. Caucaisan male, morbidly obese, in NAD. AAO x 3.  There were no vitals filed for this visit.  Vascular Examination: Capillary fill time to digits <3 seconds b/l lower extremities. Nonpalpable DP pulse(s) b/l lower extremities. Nonpalpable PT pulse(s) b/l lower extremities. Pedal hair absent. Lower extremity skin temperature gradient within normal limits. +1 pitting edema b/l lower extremities. Varicosities present b/l.  Dermatological Examination: Pedal skin with normal turgor, texture and tone bilaterally. No open wounds bilaterally. No interdigital macerations bilaterally. Toenails 1-5 b/l elongated, discolored, dystrophic, thickened, crumbly with subungual debris and tenderness to dorsal palpation. Hyperkeratotic lesion(s) L hallux, L 3rd toe and R hallux.  No erythema, no edema, no drainage, no flocculence.  Musculoskeletal: Normal muscle strength 5/5 to all lower extremity muscle groups of LLE. Muscle strength 5/5 plantarflexion, inversion and eversion of RLE.  Dropfoot noted RLE with dorsiflexion 0/5. No pain crepitus or joint limitation noted with ROM b/l. Hammertoes noted to the L hallux, L 3rd toe and R hallux. Pes planus deformity noted b/l.   Wearing  diabetic shoes. Wears AFO for drop foot RLE.  Neurological Examination: Protective sensation diminished with 10g monofilament b/l. Vibratory sensation diminished b/l. Proprioception intact bilaterally. Babinski reflex negative b/l. Clonus negative b/l.  Assessment: 1. Pain due to onychomycosis of toenails of both feet   2. Corns and callosities   3. Type 2 diabetes mellitus with vascular disease ([place])    Plan: -Examined patient. -Continue diabetic foot care principles. -Toenails 1-5 b/l were debrided in length and girth with sterile nail nippers  and dremel without iatrogenic bleeding.  -Corn(s) L 3rd toe and callus(es) L hallux and R hallux were pared utilizing sterile scalpel blade without incident. Total number debrided =3. Iatrogenic laceration sustained during right hallux. Treated with Lumicain Hemostatic Solution and alcohol and triple antibiotic ointment. Patient instructed to apply Neosporin to right hallux once daily for one week. Call office if he has any problems. -Patient to report any pedal injuries to medical professional immediately. -Patient/POA to call should there be question/concern in the interim.  Return in about 3 months (around 02/27/2021) for diabetic toenails, corn(s)/callus(es).  Marzetta Board, DPM

## 2021-01-09 ENCOUNTER — Telehealth: Payer: Self-pay | Admitting: Orthopaedic Surgery

## 2021-01-09 NOTE — Telephone Encounter (Signed)
Please advise. What type of knee braces?

## 2021-01-09 NOTE — Telephone Encounter (Signed)
Patient is requesting hinged knee braces for both knees. He states that he continues to have some problems with the knee that was operated on, and now is having problems with the opposite knee. He has tried braces from [facility], but they do not hold up long. He requests something that will be more durable.

## 2021-01-09 NOTE — Telephone Encounter (Signed)
Patient called advised he need a Rx for Bil knee braces. Patient asked if the Rx can be faxed over to [facility] in [facility]. The number to contact patient is [phone]

## 2021-01-09 NOTE — Telephone Encounter (Signed)
Call pt and ask. Maybe it was short leg brace for ankle foot drop?  Possible KI?    [place], Ok to order what he needs thanks

## 2021-01-14 NOTE — Telephone Encounter (Signed)
Per Dr. Lorin Mercy, ok for bilateral hinged knee braces. Faxed to [place] location at [phone]. Patient advised on voicemail.

## 2021-02-05 DIAGNOSIS — E1165 Type 2 diabetes mellitus with hyperglycemia: Secondary | ICD-10-CM | POA: Diagnosis not present

## 2021-02-05 DIAGNOSIS — I10 Essential (primary) hypertension: Secondary | ICD-10-CM | POA: Diagnosis not present

## 2021-02-05 DIAGNOSIS — E1159 Type 2 diabetes mellitus with other circulatory complications: Secondary | ICD-10-CM | POA: Diagnosis not present

## 2021-02-05 DIAGNOSIS — E782 Mixed hyperlipidemia: Secondary | ICD-10-CM | POA: Diagnosis not present

## 2021-02-05 DIAGNOSIS — E1142 Type 2 diabetes mellitus with diabetic polyneuropathy: Secondary | ICD-10-CM | POA: Diagnosis not present

## 2021-02-28 ENCOUNTER — Ambulatory Visit: Payer: Medicare Other | Admitting: Podiatry

## 2021-03-05 DIAGNOSIS — E782 Mixed hyperlipidemia: Secondary | ICD-10-CM | POA: Diagnosis not present

## 2021-03-05 DIAGNOSIS — I10 Essential (primary) hypertension: Secondary | ICD-10-CM | POA: Diagnosis not present

## 2021-03-05 DIAGNOSIS — E1159 Type 2 diabetes mellitus with other circulatory complications: Secondary | ICD-10-CM | POA: Diagnosis not present

## 2021-03-05 DIAGNOSIS — E1142 Type 2 diabetes mellitus with diabetic polyneuropathy: Secondary | ICD-10-CM | POA: Diagnosis not present

## 2021-03-05 DIAGNOSIS — E1165 Type 2 diabetes mellitus with hyperglycemia: Secondary | ICD-10-CM | POA: Diagnosis not present

## 2021-03-07 ENCOUNTER — Ambulatory Visit: Payer: Medicare HMO | Admitting: Podiatry

## 2021-03-07 ENCOUNTER — Other Ambulatory Visit: Payer: Self-pay

## 2021-03-07 ENCOUNTER — Encounter: Payer: Self-pay | Admitting: Podiatry

## 2021-03-07 DIAGNOSIS — Z860100 Personal history of colon polyps, unspecified: Secondary | ICD-10-CM | POA: Insufficient documentation

## 2021-03-07 DIAGNOSIS — M79675 Pain in left toe(s): Secondary | ICD-10-CM

## 2021-03-07 DIAGNOSIS — L84 Corns and callosities: Secondary | ICD-10-CM | POA: Diagnosis not present

## 2021-03-07 DIAGNOSIS — E1159 Type 2 diabetes mellitus with other circulatory complications: Secondary | ICD-10-CM

## 2021-03-07 DIAGNOSIS — M2042 Other hammer toe(s) (acquired), left foot: Secondary | ICD-10-CM

## 2021-03-07 DIAGNOSIS — M2041 Other hammer toe(s) (acquired), right foot: Secondary | ICD-10-CM

## 2021-03-07 DIAGNOSIS — Z6839 Body mass index (BMI) 39.0-39.9, adult: Secondary | ICD-10-CM | POA: Insufficient documentation

## 2021-03-07 DIAGNOSIS — M79674 Pain in right toe(s): Secondary | ICD-10-CM

## 2021-03-07 DIAGNOSIS — D126 Benign neoplasm of colon, unspecified: Secondary | ICD-10-CM | POA: Insufficient documentation

## 2021-03-07 DIAGNOSIS — H53009 Unspecified amblyopia, unspecified eye: Secondary | ICD-10-CM | POA: Insufficient documentation

## 2021-03-07 DIAGNOSIS — M21371 Foot drop, right foot: Secondary | ICD-10-CM

## 2021-03-07 DIAGNOSIS — E782 Mixed hyperlipidemia: Secondary | ICD-10-CM | POA: Insufficient documentation

## 2021-03-07 DIAGNOSIS — B351 Tinea unguium: Secondary | ICD-10-CM | POA: Diagnosis not present

## 2021-03-07 DIAGNOSIS — R899 Unspecified abnormal finding in specimens from other organs, systems and tissues: Secondary | ICD-10-CM | POA: Insufficient documentation

## 2021-03-07 DIAGNOSIS — E1142 Type 2 diabetes mellitus with diabetic polyneuropathy: Secondary | ICD-10-CM | POA: Insufficient documentation

## 2021-03-07 NOTE — Progress Notes (Signed)
Subjective: [name] presents today preventative diabetic foot care and painful mycotic nails b/l that are difficult to trim. Pain interferes with ambulation. Aggravating factors include wearing enclosed shoe gear. Pain is relieved with periodic professional debridement.   Patient states he hit his left great toe about one month ago and the skin split open. He states it healed.  He voices no new pedal problems on today's visit. States his blood glucose was 130 mg/dl today.  Christopher Fess, MD is patient's PCP. Last visit was: 02/13/2021.  No Active Allergies  Objective: [name] is a pleasant 60 y.o. Caucaisan male, morbidly obese, in NAD. AAO x 3.  There were no vitals filed for this visit.  Vascular Examination: Capillary fill time to digits <3 seconds b/l lower extremities. Nonpalpable DP pulse(s) b/l lower extremities. Nonpalpable PT pulse(s) b/l lower extremities. Pedal hair absent. Lower extremity skin temperature gradient within normal limits. +1 pitting edema b/l lower extremities. Varicosities present b/l.  Dermatological Examination: Pedal skin with normal turgor, texture and tone bilaterally. No open wounds bilaterally. No interdigital macerations bilaterally. Toenails 1-5 b/l elongated, discolored, dystrophic, thickened, crumbly with subungual debris and tenderness to dorsal palpation. Hyperkeratotic lesion(s) L hallux, L 3rd toe and R hallux.  No erythema, no edema, no drainage, no flocculence. Healed fissured lesion distal tip right hallux with dried heme. No erythema, no edema, no drainage, no fluctuance.  Musculoskeletal: Normal muscle strength 5/5 to all lower extremity muscle groups of LLE. Muscle strength 5/5 plantarflexion, inversion and eversion of RLE.  Dropfoot noted RLE with dorsiflexion 0/5. No pain crepitus or joint limitation noted with ROM b/l. Hammertoes noted to the L hallux, L 3rd toe and R hallux. Pes planus deformity noted b/l.   Wearing diabetic  shoes. Wears AFO for drop foot RLE.  Neurological Examination: Protective sensation diminished with 10g monofilament b/l. Vibratory sensation diminished b/l. Proprioception intact bilaterally. Babinski reflex negative b/l. Clonus negative b/l.  Assessment: 1. Pain due to onychomycosis of toenails of both feet   2. Corns and callosities   3. Foot drop, right   4. Acquired hammertoes of both feet   5. Type 2 diabetes mellitus with vascular disease ([place])    Plan: -Examined patient. -Continue diabetic foot care principles. -Toenails 1-5 b/l were debrided in length and girth with sterile nail nippers and dremel without iatrogenic bleeding.  -Corn(s) L 3rd toe and callus(es) L hallux and R hallux were pared utilizing sterile scalpel blade without incident. Total number debrided =3. -Patient instructed to apply Neosporin to left hallux once daily for one week. Call office if he has any problems. -Patient to report any pedal injuries to medical professional immediately. -Patient/POA to call should there be question/concern in the interim.  Return in about 3 months (around 06/07/2021).  Marzetta Board, DPM

## 2021-03-20 ENCOUNTER — Other Ambulatory Visit: Payer: Self-pay

## 2021-03-20 ENCOUNTER — Encounter: Payer: Self-pay | Admitting: Orthopaedic Surgery

## 2021-03-20 ENCOUNTER — Ambulatory Visit (INDEPENDENT_AMBULATORY_CARE_PROVIDER_SITE_OTHER): Payer: Medicare HMO

## 2021-03-20 ENCOUNTER — Ambulatory Visit: Payer: Medicare HMO | Admitting: Orthopaedic Surgery

## 2021-03-20 VITALS — BP 143/76 | HR 74 | Ht 75.0 in | Wt 286.0 lb

## 2021-03-20 DIAGNOSIS — M25561 Pain in right knee: Secondary | ICD-10-CM

## 2021-03-20 DIAGNOSIS — M1711 Unilateral primary osteoarthritis, right knee: Secondary | ICD-10-CM | POA: Insufficient documentation

## 2021-03-20 MED ORDER — METHYLPREDNISOLONE ACETATE 40 MG/ML IJ SUSP
40.0000 mg | INTRAMUSCULAR | Status: AC | PRN
  Administered 2021-03-20: 40 mg via INTRA_ARTICULAR

## 2021-03-20 MED ORDER — LIDOCAINE HCL 1 % IJ SOLN
0.5000 mL | INTRAMUSCULAR | Status: AC | PRN
  Administered 2021-03-20: .5 mL

## 2021-03-20 MED ORDER — BUPIVACAINE HCL 0.25 % IJ SOLN
4.0000 mL | INTRAMUSCULAR | Status: AC | PRN
  Administered 2021-03-20: 4 mL via INTRA_ARTICULAR

## 2021-03-20 NOTE — Progress Notes (Signed)
Office Visit Note   Patient: [name]           Date of Birth: 11-26-1961           MRN: [id number] Visit Date: 03/20/2021              Requested by: [name], MD [place],  [place] [address] PCP: [name], MD   Assessment & Plan: Visit Diagnoses:  1. Acute pain of right knee   2. Unilateral primary osteoarthritis, right knee     Plan: right knee injection. ROV prn  Follow-Up Instructions: Return if symptoms worsen or fail to improve.   Orders:  Orders Placed This Encounter  Procedures  . Large Joint Inj  . XR KNEE 3 VIEW RIGHT   No orders of the defined types were placed in this encounter.     Procedures: Large Joint Inj: R knee on 03/20/2021 11:15 AM Indications: pain and joint swelling Details: 22 G 1.5 in needle, anterolateral approach  Arthrogram: No  Medications: 40 mg methylPREDNISolone acetate 40 MG/ML; 0.5 mL lidocaine 1 %; 4 mL bupivacaine 0.25 % Outcome: tolerated well, no immediate complications Procedure, treatment alternatives, risks and benefits explained, specific risks discussed. Consent was given by the patient. Immediately prior to procedure a time out was called to verify the correct patient, procedure, equipment, support staff and site/side marked as required. Patient was prepped and draped in the usual sterile fashion.       Clinical Data: No additional findings.   Subjective: Chief Complaint  Patient presents with  . Right Knee - Pain    HPI 60 year old male returns with right knee osteoarthritis.  He has had previous problems with foot drop on the right has AFO brace.  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  He has a few degrees extension lag is ambulatory with rolling walker 5 inch wheels.  He is here with progressive right knee osteoarthritis and is requesting an injection. Review of Systems all other systems noncontributory to  HPI.   Objective: Vital Signs: BP (!) 143/76   Pulse 74   Ht 6\' 3"  (1.905 m)   Wt 286 lb (129.7 kg)   BMI 35.75 kg/m   Physical Exam Constitutional:      Appearance: He is well-developed.  HENT:     Head: Normocephalic and atraumatic.  Eyes:     Pupils: Pupils are equal, round, and reactive to light.  Neck:     Thyroid: No thyromegaly.     Trachea: No tracheal deviation.  Cardiovascular:     Rate and Rhythm: Normal rate.  Pulmonary:     Effort: Pulmonary effort is normal.     Breath sounds: No wheezing.  Abdominal:     General: Bowel sounds are normal.     Palpations: Abdomen is soft.  Skin:    General: Skin is warm and dry.     Capillary Refill: Capillary refill takes less than 2 seconds.  Neurological:     Mental Status: He is alert and oriented to person, place, and time.  Psychiatric:        Behavior: Behavior normal.        Thought Content: Thought content normal.        Judgment: Judgment normal.     Ortho Exam 2+ right knee effusion.  Well-healed left total knee arthroplasty midline incision.  Distal aspect of the patella displaced with palpable figure-of-eight wire  over the left patella.  5 degree extension lag.  No extension lag right knee. Specialty Comments:  No specialty comments available.  Imaging: XR KNEE 3 VIEW RIGHT  Result Date: 03/20/2021 Standing AP both knees lateral right knee bilateral sunrise x-ray demonstrates previous ORIF left patella with screw for great wire.  Total knee arthroplasty left knee without loosening or subsidence.  Right knee shows moderate to severe osteoarthritis with joint space narrowing spurring and subchondral sclerosis. Impression: Progressive right knee osteoarthritis.  Previous left total knee arthroplasty with inferior pole patella fracture still with slight displacement.    PMFS History: Patient Active Problem List   Diagnosis Date Noted  . Unilateral primary osteoarthritis, right knee 03/20/2021  . Amblyopia  03/07/2021  . Body mass index (BMI) 39.0-39.9, adult 03/07/2021  . Mixed hyperlipidemia 03/07/2021  . Morbid obesity ([facility]) 03/07/2021  . Personal history of colonic polyps 03/07/2021  . Polyneuropathy due to type 2 diabetes mellitus ([facility]) 03/07/2021  . Tubular adenoma of colon 03/07/2021  . Unspecified abnormal finding in specimens from other organs, systems and tissues 03/07/2021  . Pure hypercholesterolemia 11/29/2020  . Hyperglycemia due to type 2 diabetes mellitus ([facility]) 11/29/2020  . Essential hypertension 11/29/2020  . Controlled type 2 diabetes mellitus without complication ([facility]) 43/15/4008  . Chronic pain of left knee 05/27/2017  . Foot drop, right 05/27/2017  . Patella fracture 04/07/2016  . Total knee replacement status 07/13/2015   Past Medical History:  Diagnosis Date  . Arthritis   . Diabetes mellitus without complication ([facility])   . Foot drop, right   . Hypertension   . Neuropathy    due to diabetes  . Wears glasses     Family History  Problem Relation Age of Onset  . Hypertension Mother   . Breast cancer Mother   . Hypertension Father   . Diabetes Father   . Cancer - Lung Father   . Diabetes Sister   . Diabetes Other     Past Surgical History:  Procedure Laterality Date  . BACK SURGERY  09  . CHOLECYSTECTOMY  10  . COLONOSCOPY W/ BIOPSIES AND POLYPECTOMY    . EYE SURGERY Bilateral    strabismus   60 yrs old  . HAMMER TOE SURGERY     left  . KNEE ARTHROPLASTY Left 07/13/2015   Procedure: COMPUTER ASSISTED TOTAL KNEE ARTHROPLASTY LEFT, REMOVE ANTERIOR CRUCIATE LIGAMENT SCREWS AND STAPLES;  Surgeon: Marybelle Killings, MD;  Location: [facility];  Service: Orthopedics;  Laterality: Left;  . KNEE ARTHROSCOPY Left 96  . MULTIPLE TOOTH EXTRACTIONS    . ORIF PATELLA Left 04/07/2016   Procedure: OPEN REDUCTION INTERNAL (ORIF) FIXATION LEFT PATELLA;  Surgeon: Marybelle Killings, MD;  Location: [facility];  Service: Orthopedics;  Laterality: Left;   Social History   Occupational  History  . Not on file  Tobacco Use  . Smoking status: Never Smoker  . Smokeless tobacco: Never Used  Substance and Sexual Activity  . Alcohol use: Yes    Comment: rare  . Drug use: No  . Sexual activity: Not on file

## 2021-04-02 DIAGNOSIS — M1711 Unilateral primary osteoarthritis, right knee: Secondary | ICD-10-CM | POA: Diagnosis not present

## 2021-04-02 DIAGNOSIS — E1142 Type 2 diabetes mellitus with diabetic polyneuropathy: Secondary | ICD-10-CM | POA: Diagnosis not present

## 2021-04-02 DIAGNOSIS — E1159 Type 2 diabetes mellitus with other circulatory complications: Secondary | ICD-10-CM | POA: Diagnosis not present

## 2021-04-02 DIAGNOSIS — E7800 Pure hypercholesterolemia, unspecified: Secondary | ICD-10-CM | POA: Diagnosis not present

## 2021-04-02 DIAGNOSIS — E782 Mixed hyperlipidemia: Secondary | ICD-10-CM | POA: Diagnosis not present

## 2021-04-02 DIAGNOSIS — G8929 Other chronic pain: Secondary | ICD-10-CM | POA: Diagnosis not present

## 2021-04-02 DIAGNOSIS — I10 Essential (primary) hypertension: Secondary | ICD-10-CM | POA: Diagnosis not present

## 2021-04-02 DIAGNOSIS — E1165 Type 2 diabetes mellitus with hyperglycemia: Secondary | ICD-10-CM | POA: Diagnosis not present

## 2021-04-18 DIAGNOSIS — M25562 Pain in left knee: Secondary | ICD-10-CM | POA: Diagnosis not present

## 2021-04-18 DIAGNOSIS — M21371 Foot drop, right foot: Secondary | ICD-10-CM | POA: Diagnosis not present

## 2021-04-18 DIAGNOSIS — R5381 Other malaise: Secondary | ICD-10-CM | POA: Diagnosis not present

## 2021-04-18 DIAGNOSIS — R296 Repeated falls: Secondary | ICD-10-CM | POA: Diagnosis not present

## 2021-04-18 DIAGNOSIS — G8929 Other chronic pain: Secondary | ICD-10-CM | POA: Diagnosis not present

## 2021-04-18 DIAGNOSIS — M1711 Unilateral primary osteoarthritis, right knee: Secondary | ICD-10-CM | POA: Diagnosis not present

## 2021-04-22 DIAGNOSIS — E1142 Type 2 diabetes mellitus with diabetic polyneuropathy: Secondary | ICD-10-CM | POA: Diagnosis not present

## 2021-04-22 DIAGNOSIS — E1159 Type 2 diabetes mellitus with other circulatory complications: Secondary | ICD-10-CM | POA: Diagnosis not present

## 2021-04-22 DIAGNOSIS — G8929 Other chronic pain: Secondary | ICD-10-CM | POA: Diagnosis not present

## 2021-04-22 DIAGNOSIS — M21371 Foot drop, right foot: Secondary | ICD-10-CM | POA: Diagnosis not present

## 2021-04-22 DIAGNOSIS — M48061 Spinal stenosis, lumbar region without neurogenic claudication: Secondary | ICD-10-CM | POA: Diagnosis not present

## 2021-04-22 DIAGNOSIS — I10 Essential (primary) hypertension: Secondary | ICD-10-CM | POA: Diagnosis not present

## 2021-04-22 DIAGNOSIS — M1711 Unilateral primary osteoarthritis, right knee: Secondary | ICD-10-CM | POA: Diagnosis not present

## 2021-04-22 DIAGNOSIS — E782 Mixed hyperlipidemia: Secondary | ICD-10-CM | POA: Diagnosis not present

## 2021-04-23 DIAGNOSIS — M1711 Unilateral primary osteoarthritis, right knee: Secondary | ICD-10-CM | POA: Diagnosis not present

## 2021-04-23 DIAGNOSIS — G8929 Other chronic pain: Secondary | ICD-10-CM | POA: Diagnosis not present

## 2021-04-23 DIAGNOSIS — E1159 Type 2 diabetes mellitus with other circulatory complications: Secondary | ICD-10-CM | POA: Diagnosis not present

## 2021-04-23 DIAGNOSIS — E1142 Type 2 diabetes mellitus with diabetic polyneuropathy: Secondary | ICD-10-CM | POA: Diagnosis not present

## 2021-04-23 DIAGNOSIS — I10 Essential (primary) hypertension: Secondary | ICD-10-CM | POA: Diagnosis not present

## 2021-04-23 DIAGNOSIS — M21371 Foot drop, right foot: Secondary | ICD-10-CM | POA: Diagnosis not present

## 2021-04-23 DIAGNOSIS — E782 Mixed hyperlipidemia: Secondary | ICD-10-CM | POA: Diagnosis not present

## 2021-04-23 DIAGNOSIS — M48061 Spinal stenosis, lumbar region without neurogenic claudication: Secondary | ICD-10-CM | POA: Diagnosis not present

## 2021-04-24 DIAGNOSIS — G8929 Other chronic pain: Secondary | ICD-10-CM | POA: Diagnosis not present

## 2021-04-24 DIAGNOSIS — M48061 Spinal stenosis, lumbar region without neurogenic claudication: Secondary | ICD-10-CM | POA: Diagnosis not present

## 2021-04-24 DIAGNOSIS — E1159 Type 2 diabetes mellitus with other circulatory complications: Secondary | ICD-10-CM | POA: Diagnosis not present

## 2021-04-24 DIAGNOSIS — E782 Mixed hyperlipidemia: Secondary | ICD-10-CM | POA: Diagnosis not present

## 2021-04-24 DIAGNOSIS — M21371 Foot drop, right foot: Secondary | ICD-10-CM | POA: Diagnosis not present

## 2021-04-24 DIAGNOSIS — M1711 Unilateral primary osteoarthritis, right knee: Secondary | ICD-10-CM | POA: Diagnosis not present

## 2021-04-24 DIAGNOSIS — I10 Essential (primary) hypertension: Secondary | ICD-10-CM | POA: Diagnosis not present

## 2021-04-24 DIAGNOSIS — E1142 Type 2 diabetes mellitus with diabetic polyneuropathy: Secondary | ICD-10-CM | POA: Diagnosis not present

## 2021-04-29 DIAGNOSIS — G8929 Other chronic pain: Secondary | ICD-10-CM | POA: Diagnosis not present

## 2021-04-29 DIAGNOSIS — E1142 Type 2 diabetes mellitus with diabetic polyneuropathy: Secondary | ICD-10-CM | POA: Diagnosis not present

## 2021-04-29 DIAGNOSIS — E1159 Type 2 diabetes mellitus with other circulatory complications: Secondary | ICD-10-CM | POA: Diagnosis not present

## 2021-04-29 DIAGNOSIS — I10 Essential (primary) hypertension: Secondary | ICD-10-CM | POA: Diagnosis not present

## 2021-04-29 DIAGNOSIS — E782 Mixed hyperlipidemia: Secondary | ICD-10-CM | POA: Diagnosis not present

## 2021-04-29 DIAGNOSIS — M1711 Unilateral primary osteoarthritis, right knee: Secondary | ICD-10-CM | POA: Diagnosis not present

## 2021-04-29 DIAGNOSIS — M21371 Foot drop, right foot: Secondary | ICD-10-CM | POA: Diagnosis not present

## 2021-04-29 DIAGNOSIS — M48061 Spinal stenosis, lumbar region without neurogenic claudication: Secondary | ICD-10-CM | POA: Diagnosis not present

## 2021-04-30 DIAGNOSIS — M1711 Unilateral primary osteoarthritis, right knee: Secondary | ICD-10-CM | POA: Diagnosis not present

## 2021-04-30 DIAGNOSIS — I10 Essential (primary) hypertension: Secondary | ICD-10-CM | POA: Diagnosis not present

## 2021-04-30 DIAGNOSIS — M48061 Spinal stenosis, lumbar region without neurogenic claudication: Secondary | ICD-10-CM | POA: Diagnosis not present

## 2021-04-30 DIAGNOSIS — E782 Mixed hyperlipidemia: Secondary | ICD-10-CM | POA: Diagnosis not present

## 2021-04-30 DIAGNOSIS — E1159 Type 2 diabetes mellitus with other circulatory complications: Secondary | ICD-10-CM | POA: Diagnosis not present

## 2021-04-30 DIAGNOSIS — E1142 Type 2 diabetes mellitus with diabetic polyneuropathy: Secondary | ICD-10-CM | POA: Diagnosis not present

## 2021-04-30 DIAGNOSIS — M21371 Foot drop, right foot: Secondary | ICD-10-CM | POA: Diagnosis not present

## 2021-04-30 DIAGNOSIS — G8929 Other chronic pain: Secondary | ICD-10-CM | POA: Diagnosis not present

## 2021-05-01 DIAGNOSIS — I10 Essential (primary) hypertension: Secondary | ICD-10-CM | POA: Diagnosis not present

## 2021-05-01 DIAGNOSIS — M1711 Unilateral primary osteoarthritis, right knee: Secondary | ICD-10-CM | POA: Diagnosis not present

## 2021-05-01 DIAGNOSIS — E1159 Type 2 diabetes mellitus with other circulatory complications: Secondary | ICD-10-CM | POA: Diagnosis not present

## 2021-05-01 DIAGNOSIS — M21371 Foot drop, right foot: Secondary | ICD-10-CM | POA: Diagnosis not present

## 2021-05-01 DIAGNOSIS — E782 Mixed hyperlipidemia: Secondary | ICD-10-CM | POA: Diagnosis not present

## 2021-05-01 DIAGNOSIS — E1142 Type 2 diabetes mellitus with diabetic polyneuropathy: Secondary | ICD-10-CM | POA: Diagnosis not present

## 2021-05-01 DIAGNOSIS — M48061 Spinal stenosis, lumbar region without neurogenic claudication: Secondary | ICD-10-CM | POA: Diagnosis not present

## 2021-05-01 DIAGNOSIS — G8929 Other chronic pain: Secondary | ICD-10-CM | POA: Diagnosis not present

## 2021-05-03 DIAGNOSIS — E1142 Type 2 diabetes mellitus with diabetic polyneuropathy: Secondary | ICD-10-CM | POA: Diagnosis not present

## 2021-05-03 DIAGNOSIS — E782 Mixed hyperlipidemia: Secondary | ICD-10-CM | POA: Diagnosis not present

## 2021-05-03 DIAGNOSIS — I10 Essential (primary) hypertension: Secondary | ICD-10-CM | POA: Diagnosis not present

## 2021-05-03 DIAGNOSIS — M48061 Spinal stenosis, lumbar region without neurogenic claudication: Secondary | ICD-10-CM | POA: Diagnosis not present

## 2021-05-03 DIAGNOSIS — M1711 Unilateral primary osteoarthritis, right knee: Secondary | ICD-10-CM | POA: Diagnosis not present

## 2021-05-03 DIAGNOSIS — M21371 Foot drop, right foot: Secondary | ICD-10-CM | POA: Diagnosis not present

## 2021-05-03 DIAGNOSIS — E1159 Type 2 diabetes mellitus with other circulatory complications: Secondary | ICD-10-CM | POA: Diagnosis not present

## 2021-05-03 DIAGNOSIS — G8929 Other chronic pain: Secondary | ICD-10-CM | POA: Diagnosis not present

## 2021-05-06 DIAGNOSIS — M21371 Foot drop, right foot: Secondary | ICD-10-CM | POA: Diagnosis not present

## 2021-05-06 DIAGNOSIS — E1142 Type 2 diabetes mellitus with diabetic polyneuropathy: Secondary | ICD-10-CM | POA: Diagnosis not present

## 2021-05-06 DIAGNOSIS — M1711 Unilateral primary osteoarthritis, right knee: Secondary | ICD-10-CM | POA: Diagnosis not present

## 2021-05-06 DIAGNOSIS — G8929 Other chronic pain: Secondary | ICD-10-CM | POA: Diagnosis not present

## 2021-05-06 DIAGNOSIS — I10 Essential (primary) hypertension: Secondary | ICD-10-CM | POA: Diagnosis not present

## 2021-05-06 DIAGNOSIS — E782 Mixed hyperlipidemia: Secondary | ICD-10-CM | POA: Diagnosis not present

## 2021-05-06 DIAGNOSIS — M48061 Spinal stenosis, lumbar region without neurogenic claudication: Secondary | ICD-10-CM | POA: Diagnosis not present

## 2021-05-06 DIAGNOSIS — E1159 Type 2 diabetes mellitus with other circulatory complications: Secondary | ICD-10-CM | POA: Diagnosis not present

## 2021-05-07 DIAGNOSIS — E1142 Type 2 diabetes mellitus with diabetic polyneuropathy: Secondary | ICD-10-CM | POA: Diagnosis not present

## 2021-05-07 DIAGNOSIS — I10 Essential (primary) hypertension: Secondary | ICD-10-CM | POA: Diagnosis not present

## 2021-05-07 DIAGNOSIS — G8929 Other chronic pain: Secondary | ICD-10-CM | POA: Diagnosis not present

## 2021-05-07 DIAGNOSIS — E1159 Type 2 diabetes mellitus with other circulatory complications: Secondary | ICD-10-CM | POA: Diagnosis not present

## 2021-05-07 DIAGNOSIS — E782 Mixed hyperlipidemia: Secondary | ICD-10-CM | POA: Diagnosis not present

## 2021-05-07 DIAGNOSIS — M1711 Unilateral primary osteoarthritis, right knee: Secondary | ICD-10-CM | POA: Diagnosis not present

## 2021-05-07 DIAGNOSIS — M21371 Foot drop, right foot: Secondary | ICD-10-CM | POA: Diagnosis not present

## 2021-05-07 DIAGNOSIS — M48061 Spinal stenosis, lumbar region without neurogenic claudication: Secondary | ICD-10-CM | POA: Diagnosis not present

## 2021-05-08 DIAGNOSIS — G8929 Other chronic pain: Secondary | ICD-10-CM | POA: Diagnosis not present

## 2021-05-08 DIAGNOSIS — M1711 Unilateral primary osteoarthritis, right knee: Secondary | ICD-10-CM | POA: Diagnosis not present

## 2021-05-08 DIAGNOSIS — E782 Mixed hyperlipidemia: Secondary | ICD-10-CM | POA: Diagnosis not present

## 2021-05-08 DIAGNOSIS — M48061 Spinal stenosis, lumbar region without neurogenic claudication: Secondary | ICD-10-CM | POA: Diagnosis not present

## 2021-05-08 DIAGNOSIS — M21371 Foot drop, right foot: Secondary | ICD-10-CM | POA: Diagnosis not present

## 2021-05-08 DIAGNOSIS — E1159 Type 2 diabetes mellitus with other circulatory complications: Secondary | ICD-10-CM | POA: Diagnosis not present

## 2021-05-08 DIAGNOSIS — E1142 Type 2 diabetes mellitus with diabetic polyneuropathy: Secondary | ICD-10-CM | POA: Diagnosis not present

## 2021-05-08 DIAGNOSIS — I10 Essential (primary) hypertension: Secondary | ICD-10-CM | POA: Diagnosis not present

## 2021-05-09 DIAGNOSIS — M1711 Unilateral primary osteoarthritis, right knee: Secondary | ICD-10-CM | POA: Diagnosis not present

## 2021-05-09 DIAGNOSIS — E1142 Type 2 diabetes mellitus with diabetic polyneuropathy: Secondary | ICD-10-CM | POA: Diagnosis not present

## 2021-05-09 DIAGNOSIS — I10 Essential (primary) hypertension: Secondary | ICD-10-CM | POA: Diagnosis not present

## 2021-05-09 DIAGNOSIS — M48061 Spinal stenosis, lumbar region without neurogenic claudication: Secondary | ICD-10-CM | POA: Diagnosis not present

## 2021-05-09 DIAGNOSIS — M21371 Foot drop, right foot: Secondary | ICD-10-CM | POA: Diagnosis not present

## 2021-05-09 DIAGNOSIS — G8929 Other chronic pain: Secondary | ICD-10-CM | POA: Diagnosis not present

## 2021-05-09 DIAGNOSIS — E782 Mixed hyperlipidemia: Secondary | ICD-10-CM | POA: Diagnosis not present

## 2021-05-09 DIAGNOSIS — E1159 Type 2 diabetes mellitus with other circulatory complications: Secondary | ICD-10-CM | POA: Diagnosis not present

## 2021-05-10 DIAGNOSIS — E1159 Type 2 diabetes mellitus with other circulatory complications: Secondary | ICD-10-CM | POA: Diagnosis not present

## 2021-05-10 DIAGNOSIS — E1165 Type 2 diabetes mellitus with hyperglycemia: Secondary | ICD-10-CM | POA: Diagnosis not present

## 2021-05-10 DIAGNOSIS — M1711 Unilateral primary osteoarthritis, right knee: Secondary | ICD-10-CM | POA: Diagnosis not present

## 2021-05-10 DIAGNOSIS — E782 Mixed hyperlipidemia: Secondary | ICD-10-CM | POA: Diagnosis not present

## 2021-05-10 DIAGNOSIS — E1142 Type 2 diabetes mellitus with diabetic polyneuropathy: Secondary | ICD-10-CM | POA: Diagnosis not present

## 2021-05-10 DIAGNOSIS — I10 Essential (primary) hypertension: Secondary | ICD-10-CM | POA: Diagnosis not present

## 2021-05-10 DIAGNOSIS — G8929 Other chronic pain: Secondary | ICD-10-CM | POA: Diagnosis not present

## 2021-05-15 DIAGNOSIS — E1159 Type 2 diabetes mellitus with other circulatory complications: Secondary | ICD-10-CM | POA: Diagnosis not present

## 2021-05-15 DIAGNOSIS — I10 Essential (primary) hypertension: Secondary | ICD-10-CM | POA: Diagnosis not present

## 2021-05-15 DIAGNOSIS — M1711 Unilateral primary osteoarthritis, right knee: Secondary | ICD-10-CM | POA: Diagnosis not present

## 2021-05-15 DIAGNOSIS — E1142 Type 2 diabetes mellitus with diabetic polyneuropathy: Secondary | ICD-10-CM | POA: Diagnosis not present

## 2021-05-15 DIAGNOSIS — M48061 Spinal stenosis, lumbar region without neurogenic claudication: Secondary | ICD-10-CM | POA: Diagnosis not present

## 2021-05-15 DIAGNOSIS — G8929 Other chronic pain: Secondary | ICD-10-CM | POA: Diagnosis not present

## 2021-05-15 DIAGNOSIS — M21371 Foot drop, right foot: Secondary | ICD-10-CM | POA: Diagnosis not present

## 2021-05-15 DIAGNOSIS — E782 Mixed hyperlipidemia: Secondary | ICD-10-CM | POA: Diagnosis not present

## 2021-05-21 DIAGNOSIS — I10 Essential (primary) hypertension: Secondary | ICD-10-CM | POA: Diagnosis not present

## 2021-05-21 DIAGNOSIS — G8929 Other chronic pain: Secondary | ICD-10-CM | POA: Diagnosis not present

## 2021-05-21 DIAGNOSIS — M21371 Foot drop, right foot: Secondary | ICD-10-CM | POA: Diagnosis not present

## 2021-05-21 DIAGNOSIS — E1159 Type 2 diabetes mellitus with other circulatory complications: Secondary | ICD-10-CM | POA: Diagnosis not present

## 2021-05-21 DIAGNOSIS — E1142 Type 2 diabetes mellitus with diabetic polyneuropathy: Secondary | ICD-10-CM | POA: Diagnosis not present

## 2021-05-21 DIAGNOSIS — E782 Mixed hyperlipidemia: Secondary | ICD-10-CM | POA: Diagnosis not present

## 2021-05-21 DIAGNOSIS — M1711 Unilateral primary osteoarthritis, right knee: Secondary | ICD-10-CM | POA: Diagnosis not present

## 2021-05-21 DIAGNOSIS — M48061 Spinal stenosis, lumbar region without neurogenic claudication: Secondary | ICD-10-CM | POA: Diagnosis not present

## 2021-05-22 DIAGNOSIS — G8929 Other chronic pain: Secondary | ICD-10-CM | POA: Diagnosis not present

## 2021-05-22 DIAGNOSIS — I10 Essential (primary) hypertension: Secondary | ICD-10-CM | POA: Diagnosis not present

## 2021-05-22 DIAGNOSIS — M48061 Spinal stenosis, lumbar region without neurogenic claudication: Secondary | ICD-10-CM | POA: Diagnosis not present

## 2021-05-22 DIAGNOSIS — E1142 Type 2 diabetes mellitus with diabetic polyneuropathy: Secondary | ICD-10-CM | POA: Diagnosis not present

## 2021-05-22 DIAGNOSIS — E782 Mixed hyperlipidemia: Secondary | ICD-10-CM | POA: Diagnosis not present

## 2021-05-22 DIAGNOSIS — M1711 Unilateral primary osteoarthritis, right knee: Secondary | ICD-10-CM | POA: Diagnosis not present

## 2021-05-22 DIAGNOSIS — E1159 Type 2 diabetes mellitus with other circulatory complications: Secondary | ICD-10-CM | POA: Diagnosis not present

## 2021-05-22 DIAGNOSIS — M21371 Foot drop, right foot: Secondary | ICD-10-CM | POA: Diagnosis not present

## 2021-05-27 DIAGNOSIS — R413 Other amnesia: Secondary | ICD-10-CM | POA: Diagnosis not present

## 2021-05-28 DIAGNOSIS — G8929 Other chronic pain: Secondary | ICD-10-CM | POA: Diagnosis not present

## 2021-05-28 DIAGNOSIS — M21371 Foot drop, right foot: Secondary | ICD-10-CM | POA: Diagnosis not present

## 2021-05-28 DIAGNOSIS — M1711 Unilateral primary osteoarthritis, right knee: Secondary | ICD-10-CM | POA: Diagnosis not present

## 2021-05-28 DIAGNOSIS — M48061 Spinal stenosis, lumbar region without neurogenic claudication: Secondary | ICD-10-CM | POA: Diagnosis not present

## 2021-05-28 DIAGNOSIS — E1142 Type 2 diabetes mellitus with diabetic polyneuropathy: Secondary | ICD-10-CM | POA: Diagnosis not present

## 2021-05-28 DIAGNOSIS — I10 Essential (primary) hypertension: Secondary | ICD-10-CM | POA: Diagnosis not present

## 2021-05-28 DIAGNOSIS — E1159 Type 2 diabetes mellitus with other circulatory complications: Secondary | ICD-10-CM | POA: Diagnosis not present

## 2021-05-28 DIAGNOSIS — E782 Mixed hyperlipidemia: Secondary | ICD-10-CM | POA: Diagnosis not present

## 2021-05-30 DIAGNOSIS — G8929 Other chronic pain: Secondary | ICD-10-CM | POA: Diagnosis not present

## 2021-05-30 DIAGNOSIS — E1159 Type 2 diabetes mellitus with other circulatory complications: Secondary | ICD-10-CM | POA: Diagnosis not present

## 2021-05-30 DIAGNOSIS — M48061 Spinal stenosis, lumbar region without neurogenic claudication: Secondary | ICD-10-CM | POA: Diagnosis not present

## 2021-05-30 DIAGNOSIS — E782 Mixed hyperlipidemia: Secondary | ICD-10-CM | POA: Diagnosis not present

## 2021-05-30 DIAGNOSIS — I10 Essential (primary) hypertension: Secondary | ICD-10-CM | POA: Diagnosis not present

## 2021-05-30 DIAGNOSIS — M21371 Foot drop, right foot: Secondary | ICD-10-CM | POA: Diagnosis not present

## 2021-05-30 DIAGNOSIS — M1711 Unilateral primary osteoarthritis, right knee: Secondary | ICD-10-CM | POA: Diagnosis not present

## 2021-05-30 DIAGNOSIS — E1142 Type 2 diabetes mellitus with diabetic polyneuropathy: Secondary | ICD-10-CM | POA: Diagnosis not present

## 2021-06-05 DIAGNOSIS — E782 Mixed hyperlipidemia: Secondary | ICD-10-CM | POA: Diagnosis not present

## 2021-06-05 DIAGNOSIS — M48061 Spinal stenosis, lumbar region without neurogenic claudication: Secondary | ICD-10-CM | POA: Diagnosis not present

## 2021-06-05 DIAGNOSIS — E1142 Type 2 diabetes mellitus with diabetic polyneuropathy: Secondary | ICD-10-CM | POA: Diagnosis not present

## 2021-06-05 DIAGNOSIS — G8929 Other chronic pain: Secondary | ICD-10-CM | POA: Diagnosis not present

## 2021-06-05 DIAGNOSIS — E1159 Type 2 diabetes mellitus with other circulatory complications: Secondary | ICD-10-CM | POA: Diagnosis not present

## 2021-06-05 DIAGNOSIS — M1711 Unilateral primary osteoarthritis, right knee: Secondary | ICD-10-CM | POA: Diagnosis not present

## 2021-06-05 DIAGNOSIS — I10 Essential (primary) hypertension: Secondary | ICD-10-CM | POA: Diagnosis not present

## 2021-06-05 DIAGNOSIS — M21371 Foot drop, right foot: Secondary | ICD-10-CM | POA: Diagnosis not present

## 2021-06-06 DIAGNOSIS — G8929 Other chronic pain: Secondary | ICD-10-CM | POA: Diagnosis not present

## 2021-06-06 DIAGNOSIS — E1142 Type 2 diabetes mellitus with diabetic polyneuropathy: Secondary | ICD-10-CM | POA: Diagnosis not present

## 2021-06-06 DIAGNOSIS — E1159 Type 2 diabetes mellitus with other circulatory complications: Secondary | ICD-10-CM | POA: Diagnosis not present

## 2021-06-06 DIAGNOSIS — E1165 Type 2 diabetes mellitus with hyperglycemia: Secondary | ICD-10-CM | POA: Diagnosis not present

## 2021-06-06 DIAGNOSIS — M1711 Unilateral primary osteoarthritis, right knee: Secondary | ICD-10-CM | POA: Diagnosis not present

## 2021-06-06 DIAGNOSIS — E782 Mixed hyperlipidemia: Secondary | ICD-10-CM | POA: Diagnosis not present

## 2021-06-06 DIAGNOSIS — I10 Essential (primary) hypertension: Secondary | ICD-10-CM | POA: Diagnosis not present

## 2021-06-09 DIAGNOSIS — M1711 Unilateral primary osteoarthritis, right knee: Secondary | ICD-10-CM | POA: Diagnosis not present

## 2021-06-09 DIAGNOSIS — E1142 Type 2 diabetes mellitus with diabetic polyneuropathy: Secondary | ICD-10-CM | POA: Diagnosis not present

## 2021-06-09 DIAGNOSIS — I10 Essential (primary) hypertension: Secondary | ICD-10-CM | POA: Diagnosis not present

## 2021-06-09 DIAGNOSIS — M21371 Foot drop, right foot: Secondary | ICD-10-CM | POA: Diagnosis not present

## 2021-06-09 DIAGNOSIS — E1159 Type 2 diabetes mellitus with other circulatory complications: Secondary | ICD-10-CM | POA: Diagnosis not present

## 2021-06-09 DIAGNOSIS — M48061 Spinal stenosis, lumbar region without neurogenic claudication: Secondary | ICD-10-CM | POA: Diagnosis not present

## 2021-06-09 DIAGNOSIS — G8929 Other chronic pain: Secondary | ICD-10-CM | POA: Diagnosis not present

## 2021-06-09 DIAGNOSIS — E782 Mixed hyperlipidemia: Secondary | ICD-10-CM | POA: Diagnosis not present

## 2021-06-18 DIAGNOSIS — M21371 Foot drop, right foot: Secondary | ICD-10-CM | POA: Diagnosis not present

## 2021-06-18 DIAGNOSIS — E1142 Type 2 diabetes mellitus with diabetic polyneuropathy: Secondary | ICD-10-CM | POA: Diagnosis not present

## 2021-06-18 DIAGNOSIS — M48061 Spinal stenosis, lumbar region without neurogenic claudication: Secondary | ICD-10-CM | POA: Diagnosis not present

## 2021-06-18 DIAGNOSIS — E1159 Type 2 diabetes mellitus with other circulatory complications: Secondary | ICD-10-CM | POA: Diagnosis not present

## 2021-06-18 DIAGNOSIS — E782 Mixed hyperlipidemia: Secondary | ICD-10-CM | POA: Diagnosis not present

## 2021-06-18 DIAGNOSIS — I10 Essential (primary) hypertension: Secondary | ICD-10-CM | POA: Diagnosis not present

## 2021-06-18 DIAGNOSIS — G8929 Other chronic pain: Secondary | ICD-10-CM | POA: Diagnosis not present

## 2021-06-18 DIAGNOSIS — M1711 Unilateral primary osteoarthritis, right knee: Secondary | ICD-10-CM | POA: Diagnosis not present

## 2021-06-24 DIAGNOSIS — M21371 Foot drop, right foot: Secondary | ICD-10-CM | POA: Diagnosis not present

## 2021-06-24 DIAGNOSIS — G8929 Other chronic pain: Secondary | ICD-10-CM | POA: Diagnosis not present

## 2021-06-24 DIAGNOSIS — M48061 Spinal stenosis, lumbar region without neurogenic claudication: Secondary | ICD-10-CM | POA: Diagnosis not present

## 2021-06-24 DIAGNOSIS — E782 Mixed hyperlipidemia: Secondary | ICD-10-CM | POA: Diagnosis not present

## 2021-06-24 DIAGNOSIS — I10 Essential (primary) hypertension: Secondary | ICD-10-CM | POA: Diagnosis not present

## 2021-06-24 DIAGNOSIS — E1159 Type 2 diabetes mellitus with other circulatory complications: Secondary | ICD-10-CM | POA: Diagnosis not present

## 2021-06-24 DIAGNOSIS — E1142 Type 2 diabetes mellitus with diabetic polyneuropathy: Secondary | ICD-10-CM | POA: Diagnosis not present

## 2021-06-24 DIAGNOSIS — M1711 Unilateral primary osteoarthritis, right knee: Secondary | ICD-10-CM | POA: Diagnosis not present

## 2021-07-01 DIAGNOSIS — G8929 Other chronic pain: Secondary | ICD-10-CM | POA: Diagnosis not present

## 2021-07-01 DIAGNOSIS — M1711 Unilateral primary osteoarthritis, right knee: Secondary | ICD-10-CM | POA: Diagnosis not present

## 2021-07-01 DIAGNOSIS — M48061 Spinal stenosis, lumbar region without neurogenic claudication: Secondary | ICD-10-CM | POA: Diagnosis not present

## 2021-07-01 DIAGNOSIS — M21371 Foot drop, right foot: Secondary | ICD-10-CM | POA: Diagnosis not present

## 2021-07-01 DIAGNOSIS — I10 Essential (primary) hypertension: Secondary | ICD-10-CM | POA: Diagnosis not present

## 2021-07-01 DIAGNOSIS — E1159 Type 2 diabetes mellitus with other circulatory complications: Secondary | ICD-10-CM | POA: Diagnosis not present

## 2021-07-01 DIAGNOSIS — E1142 Type 2 diabetes mellitus with diabetic polyneuropathy: Secondary | ICD-10-CM | POA: Diagnosis not present

## 2021-07-01 DIAGNOSIS — E782 Mixed hyperlipidemia: Secondary | ICD-10-CM | POA: Diagnosis not present

## 2021-07-04 ENCOUNTER — Ambulatory Visit: Payer: Medicare HMO | Admitting: Podiatry

## 2021-07-05 DIAGNOSIS — E1159 Type 2 diabetes mellitus with other circulatory complications: Secondary | ICD-10-CM | POA: Diagnosis not present

## 2021-07-05 DIAGNOSIS — I10 Essential (primary) hypertension: Secondary | ICD-10-CM | POA: Diagnosis not present

## 2021-07-05 DIAGNOSIS — E1165 Type 2 diabetes mellitus with hyperglycemia: Secondary | ICD-10-CM | POA: Diagnosis not present

## 2021-07-05 DIAGNOSIS — M1711 Unilateral primary osteoarthritis, right knee: Secondary | ICD-10-CM | POA: Diagnosis not present

## 2021-07-05 DIAGNOSIS — G8929 Other chronic pain: Secondary | ICD-10-CM | POA: Diagnosis not present

## 2021-07-05 DIAGNOSIS — E1142 Type 2 diabetes mellitus with diabetic polyneuropathy: Secondary | ICD-10-CM | POA: Diagnosis not present

## 2021-07-05 DIAGNOSIS — E782 Mixed hyperlipidemia: Secondary | ICD-10-CM | POA: Diagnosis not present

## 2021-07-11 DIAGNOSIS — E1159 Type 2 diabetes mellitus with other circulatory complications: Secondary | ICD-10-CM | POA: Diagnosis not present

## 2021-07-11 DIAGNOSIS — M1711 Unilateral primary osteoarthritis, right knee: Secondary | ICD-10-CM | POA: Diagnosis not present

## 2021-07-11 DIAGNOSIS — I10 Essential (primary) hypertension: Secondary | ICD-10-CM | POA: Diagnosis not present

## 2021-07-11 DIAGNOSIS — G8929 Other chronic pain: Secondary | ICD-10-CM | POA: Diagnosis not present

## 2021-07-11 DIAGNOSIS — E782 Mixed hyperlipidemia: Secondary | ICD-10-CM | POA: Diagnosis not present

## 2021-07-11 DIAGNOSIS — M21371 Foot drop, right foot: Secondary | ICD-10-CM | POA: Diagnosis not present

## 2021-07-11 DIAGNOSIS — M48061 Spinal stenosis, lumbar region without neurogenic claudication: Secondary | ICD-10-CM | POA: Diagnosis not present

## 2021-07-11 DIAGNOSIS — E1142 Type 2 diabetes mellitus with diabetic polyneuropathy: Secondary | ICD-10-CM | POA: Diagnosis not present

## 2021-07-18 ENCOUNTER — Ambulatory Visit: Payer: Medicare HMO | Admitting: Podiatry

## 2021-07-18 DIAGNOSIS — M1711 Unilateral primary osteoarthritis, right knee: Secondary | ICD-10-CM | POA: Diagnosis not present

## 2021-07-18 DIAGNOSIS — E1142 Type 2 diabetes mellitus with diabetic polyneuropathy: Secondary | ICD-10-CM | POA: Diagnosis not present

## 2021-07-18 DIAGNOSIS — G8929 Other chronic pain: Secondary | ICD-10-CM | POA: Diagnosis not present

## 2021-07-18 DIAGNOSIS — E782 Mixed hyperlipidemia: Secondary | ICD-10-CM | POA: Diagnosis not present

## 2021-07-18 DIAGNOSIS — M48061 Spinal stenosis, lumbar region without neurogenic claudication: Secondary | ICD-10-CM | POA: Diagnosis not present

## 2021-07-18 DIAGNOSIS — E1159 Type 2 diabetes mellitus with other circulatory complications: Secondary | ICD-10-CM | POA: Diagnosis not present

## 2021-07-18 DIAGNOSIS — M21371 Foot drop, right foot: Secondary | ICD-10-CM | POA: Diagnosis not present

## 2021-07-18 DIAGNOSIS — I10 Essential (primary) hypertension: Secondary | ICD-10-CM | POA: Diagnosis not present

## 2021-07-22 DIAGNOSIS — I10 Essential (primary) hypertension: Secondary | ICD-10-CM | POA: Diagnosis not present

## 2021-07-22 DIAGNOSIS — M1711 Unilateral primary osteoarthritis, right knee: Secondary | ICD-10-CM | POA: Diagnosis not present

## 2021-07-22 DIAGNOSIS — E782 Mixed hyperlipidemia: Secondary | ICD-10-CM | POA: Diagnosis not present

## 2021-07-22 DIAGNOSIS — E1142 Type 2 diabetes mellitus with diabetic polyneuropathy: Secondary | ICD-10-CM | POA: Diagnosis not present

## 2021-07-22 DIAGNOSIS — M48061 Spinal stenosis, lumbar region without neurogenic claudication: Secondary | ICD-10-CM | POA: Diagnosis not present

## 2021-07-22 DIAGNOSIS — E1159 Type 2 diabetes mellitus with other circulatory complications: Secondary | ICD-10-CM | POA: Diagnosis not present

## 2021-07-22 DIAGNOSIS — M21371 Foot drop, right foot: Secondary | ICD-10-CM | POA: Diagnosis not present

## 2021-07-22 DIAGNOSIS — G8929 Other chronic pain: Secondary | ICD-10-CM | POA: Diagnosis not present

## 2021-08-02 DIAGNOSIS — E1159 Type 2 diabetes mellitus with other circulatory complications: Secondary | ICD-10-CM | POA: Diagnosis not present

## 2021-08-02 DIAGNOSIS — E1142 Type 2 diabetes mellitus with diabetic polyneuropathy: Secondary | ICD-10-CM | POA: Diagnosis not present

## 2021-08-02 DIAGNOSIS — M1711 Unilateral primary osteoarthritis, right knee: Secondary | ICD-10-CM | POA: Diagnosis not present

## 2021-08-02 DIAGNOSIS — E1165 Type 2 diabetes mellitus with hyperglycemia: Secondary | ICD-10-CM | POA: Diagnosis not present

## 2021-08-02 DIAGNOSIS — E782 Mixed hyperlipidemia: Secondary | ICD-10-CM | POA: Diagnosis not present

## 2021-08-02 DIAGNOSIS — G8929 Other chronic pain: Secondary | ICD-10-CM | POA: Diagnosis not present

## 2021-08-02 DIAGNOSIS — I10 Essential (primary) hypertension: Secondary | ICD-10-CM | POA: Diagnosis not present

## 2021-08-05 DIAGNOSIS — E782 Mixed hyperlipidemia: Secondary | ICD-10-CM | POA: Diagnosis not present

## 2021-08-05 DIAGNOSIS — M48061 Spinal stenosis, lumbar region without neurogenic claudication: Secondary | ICD-10-CM | POA: Diagnosis not present

## 2021-08-05 DIAGNOSIS — M21371 Foot drop, right foot: Secondary | ICD-10-CM | POA: Diagnosis not present

## 2021-08-05 DIAGNOSIS — E1142 Type 2 diabetes mellitus with diabetic polyneuropathy: Secondary | ICD-10-CM | POA: Diagnosis not present

## 2021-08-05 DIAGNOSIS — E1159 Type 2 diabetes mellitus with other circulatory complications: Secondary | ICD-10-CM | POA: Diagnosis not present

## 2021-08-05 DIAGNOSIS — G8929 Other chronic pain: Secondary | ICD-10-CM | POA: Diagnosis not present

## 2021-08-05 DIAGNOSIS — M1711 Unilateral primary osteoarthritis, right knee: Secondary | ICD-10-CM | POA: Diagnosis not present

## 2021-08-05 DIAGNOSIS — I10 Essential (primary) hypertension: Secondary | ICD-10-CM | POA: Diagnosis not present

## 2021-08-13 DIAGNOSIS — M21371 Foot drop, right foot: Secondary | ICD-10-CM | POA: Diagnosis not present

## 2021-08-13 DIAGNOSIS — E782 Mixed hyperlipidemia: Secondary | ICD-10-CM | POA: Diagnosis not present

## 2021-08-13 DIAGNOSIS — M1711 Unilateral primary osteoarthritis, right knee: Secondary | ICD-10-CM | POA: Diagnosis not present

## 2021-08-13 DIAGNOSIS — I10 Essential (primary) hypertension: Secondary | ICD-10-CM | POA: Diagnosis not present

## 2021-08-13 DIAGNOSIS — E1159 Type 2 diabetes mellitus with other circulatory complications: Secondary | ICD-10-CM | POA: Diagnosis not present

## 2021-08-13 DIAGNOSIS — E1142 Type 2 diabetes mellitus with diabetic polyneuropathy: Secondary | ICD-10-CM | POA: Diagnosis not present

## 2021-08-13 DIAGNOSIS — G8929 Other chronic pain: Secondary | ICD-10-CM | POA: Diagnosis not present

## 2021-08-13 DIAGNOSIS — M48061 Spinal stenosis, lumbar region without neurogenic claudication: Secondary | ICD-10-CM | POA: Diagnosis not present

## 2021-09-01 DIAGNOSIS — M1711 Unilateral primary osteoarthritis, right knee: Secondary | ICD-10-CM | POA: Diagnosis not present

## 2021-09-01 DIAGNOSIS — E782 Mixed hyperlipidemia: Secondary | ICD-10-CM | POA: Diagnosis not present

## 2021-09-01 DIAGNOSIS — E1165 Type 2 diabetes mellitus with hyperglycemia: Secondary | ICD-10-CM | POA: Diagnosis not present

## 2021-09-01 DIAGNOSIS — G8929 Other chronic pain: Secondary | ICD-10-CM | POA: Diagnosis not present

## 2021-09-01 DIAGNOSIS — I10 Essential (primary) hypertension: Secondary | ICD-10-CM | POA: Diagnosis not present

## 2021-09-01 DIAGNOSIS — E1159 Type 2 diabetes mellitus with other circulatory complications: Secondary | ICD-10-CM | POA: Diagnosis not present

## 2021-09-01 DIAGNOSIS — E1142 Type 2 diabetes mellitus with diabetic polyneuropathy: Secondary | ICD-10-CM | POA: Diagnosis not present

## 2021-09-05 DIAGNOSIS — Z0000 Encounter for general adult medical examination without abnormal findings: Secondary | ICD-10-CM | POA: Diagnosis not present

## 2021-09-30 DIAGNOSIS — M1711 Unilateral primary osteoarthritis, right knee: Secondary | ICD-10-CM | POA: Diagnosis not present

## 2021-09-30 DIAGNOSIS — E1142 Type 2 diabetes mellitus with diabetic polyneuropathy: Secondary | ICD-10-CM | POA: Diagnosis not present

## 2021-09-30 DIAGNOSIS — E782 Mixed hyperlipidemia: Secondary | ICD-10-CM | POA: Diagnosis not present

## 2021-09-30 DIAGNOSIS — E1165 Type 2 diabetes mellitus with hyperglycemia: Secondary | ICD-10-CM | POA: Diagnosis not present

## 2021-09-30 DIAGNOSIS — E1159 Type 2 diabetes mellitus with other circulatory complications: Secondary | ICD-10-CM | POA: Diagnosis not present

## 2021-09-30 DIAGNOSIS — I10 Essential (primary) hypertension: Secondary | ICD-10-CM | POA: Diagnosis not present

## 2021-09-30 DIAGNOSIS — G8929 Other chronic pain: Secondary | ICD-10-CM | POA: Diagnosis not present

## 2021-10-22 DIAGNOSIS — E7800 Pure hypercholesterolemia, unspecified: Secondary | ICD-10-CM | POA: Diagnosis not present

## 2021-10-22 DIAGNOSIS — E1165 Type 2 diabetes mellitus with hyperglycemia: Secondary | ICD-10-CM | POA: Diagnosis not present

## 2021-10-22 DIAGNOSIS — I10 Essential (primary) hypertension: Secondary | ICD-10-CM | POA: Diagnosis not present

## 2021-10-28 DIAGNOSIS — G8929 Other chronic pain: Secondary | ICD-10-CM | POA: Diagnosis not present

## 2021-10-28 DIAGNOSIS — E1142 Type 2 diabetes mellitus with diabetic polyneuropathy: Secondary | ICD-10-CM | POA: Diagnosis not present

## 2021-10-28 DIAGNOSIS — I10 Essential (primary) hypertension: Secondary | ICD-10-CM | POA: Diagnosis not present

## 2021-10-28 DIAGNOSIS — E1165 Type 2 diabetes mellitus with hyperglycemia: Secondary | ICD-10-CM | POA: Diagnosis not present

## 2021-10-28 DIAGNOSIS — E782 Mixed hyperlipidemia: Secondary | ICD-10-CM | POA: Diagnosis not present

## 2021-10-28 DIAGNOSIS — E1159 Type 2 diabetes mellitus with other circulatory complications: Secondary | ICD-10-CM | POA: Diagnosis not present

## 2021-10-28 DIAGNOSIS — M1711 Unilateral primary osteoarthritis, right knee: Secondary | ICD-10-CM | POA: Diagnosis not present

## 2021-11-27 DIAGNOSIS — E782 Mixed hyperlipidemia: Secondary | ICD-10-CM | POA: Diagnosis not present

## 2021-11-27 DIAGNOSIS — E1142 Type 2 diabetes mellitus with diabetic polyneuropathy: Secondary | ICD-10-CM | POA: Diagnosis not present

## 2021-11-27 DIAGNOSIS — E1165 Type 2 diabetes mellitus with hyperglycemia: Secondary | ICD-10-CM | POA: Diagnosis not present

## 2021-11-27 DIAGNOSIS — M1711 Unilateral primary osteoarthritis, right knee: Secondary | ICD-10-CM | POA: Diagnosis not present

## 2021-11-27 DIAGNOSIS — G8929 Other chronic pain: Secondary | ICD-10-CM | POA: Diagnosis not present

## 2021-11-27 DIAGNOSIS — E1159 Type 2 diabetes mellitus with other circulatory complications: Secondary | ICD-10-CM | POA: Diagnosis not present

## 2021-11-27 DIAGNOSIS — I10 Essential (primary) hypertension: Secondary | ICD-10-CM | POA: Diagnosis not present

## 2021-12-27 DIAGNOSIS — E1159 Type 2 diabetes mellitus with other circulatory complications: Secondary | ICD-10-CM | POA: Diagnosis not present

## 2021-12-27 DIAGNOSIS — G8929 Other chronic pain: Secondary | ICD-10-CM | POA: Diagnosis not present

## 2021-12-27 DIAGNOSIS — E1142 Type 2 diabetes mellitus with diabetic polyneuropathy: Secondary | ICD-10-CM | POA: Diagnosis not present

## 2021-12-27 DIAGNOSIS — M1711 Unilateral primary osteoarthritis, right knee: Secondary | ICD-10-CM | POA: Diagnosis not present

## 2021-12-27 DIAGNOSIS — E1165 Type 2 diabetes mellitus with hyperglycemia: Secondary | ICD-10-CM | POA: Diagnosis not present

## 2021-12-27 DIAGNOSIS — I10 Essential (primary) hypertension: Secondary | ICD-10-CM | POA: Diagnosis not present

## 2021-12-27 DIAGNOSIS — E782 Mixed hyperlipidemia: Secondary | ICD-10-CM | POA: Diagnosis not present

## 2022-01-10 DIAGNOSIS — E782 Mixed hyperlipidemia: Secondary | ICD-10-CM | POA: Diagnosis not present

## 2022-01-10 DIAGNOSIS — Z125 Encounter for screening for malignant neoplasm of prostate: Secondary | ICD-10-CM | POA: Diagnosis not present

## 2022-01-10 DIAGNOSIS — I10 Essential (primary) hypertension: Secondary | ICD-10-CM | POA: Diagnosis not present

## 2022-01-10 DIAGNOSIS — E1142 Type 2 diabetes mellitus with diabetic polyneuropathy: Secondary | ICD-10-CM | POA: Diagnosis not present

## 2022-01-10 DIAGNOSIS — L97909 Non-pressure chronic ulcer of unspecified part of unspecified lower leg with unspecified severity: Secondary | ICD-10-CM | POA: Diagnosis not present

## 2022-01-10 DIAGNOSIS — E1159 Type 2 diabetes mellitus with other circulatory complications: Secondary | ICD-10-CM | POA: Diagnosis not present

## 2022-01-10 DIAGNOSIS — D473 Essential (hemorrhagic) thrombocythemia: Secondary | ICD-10-CM | POA: Diagnosis not present

## 2022-01-21 ENCOUNTER — Encounter: Payer: Self-pay | Admitting: Sports Medicine

## 2022-01-21 ENCOUNTER — Ambulatory Visit: Payer: Medicare HMO | Admitting: Sports Medicine

## 2022-01-21 VITALS — Temp 97.5°F

## 2022-01-21 DIAGNOSIS — M79675 Pain in left toe(s): Secondary | ICD-10-CM | POA: Diagnosis not present

## 2022-01-21 DIAGNOSIS — L03115 Cellulitis of right lower limb: Secondary | ICD-10-CM

## 2022-01-21 DIAGNOSIS — I83019 Varicose veins of right lower extremity with ulcer of unspecified site: Secondary | ICD-10-CM | POA: Diagnosis not present

## 2022-01-21 DIAGNOSIS — B351 Tinea unguium: Secondary | ICD-10-CM | POA: Diagnosis not present

## 2022-01-21 DIAGNOSIS — R296 Repeated falls: Secondary | ICD-10-CM | POA: Insufficient documentation

## 2022-01-21 DIAGNOSIS — L97919 Non-pressure chronic ulcer of unspecified part of right lower leg with unspecified severity: Secondary | ICD-10-CM | POA: Diagnosis not present

## 2022-01-21 DIAGNOSIS — R413 Other amnesia: Secondary | ICD-10-CM | POA: Insufficient documentation

## 2022-01-21 DIAGNOSIS — L97909 Non-pressure chronic ulcer of unspecified part of unspecified lower leg with unspecified severity: Secondary | ICD-10-CM | POA: Insufficient documentation

## 2022-01-21 DIAGNOSIS — E1159 Type 2 diabetes mellitus with other circulatory complications: Secondary | ICD-10-CM | POA: Diagnosis not present

## 2022-01-21 DIAGNOSIS — I890 Lymphedema, not elsewhere classified: Secondary | ICD-10-CM

## 2022-01-21 DIAGNOSIS — D473 Essential (hemorrhagic) thrombocythemia: Secondary | ICD-10-CM | POA: Insufficient documentation

## 2022-01-21 DIAGNOSIS — G8929 Other chronic pain: Secondary | ICD-10-CM | POA: Insufficient documentation

## 2022-01-21 DIAGNOSIS — M79674 Pain in right toe(s): Secondary | ICD-10-CM | POA: Diagnosis not present

## 2022-01-21 MED ORDER — SULFAMETHOXAZOLE-TRIMETHOPRIM 400-80 MG PO TABS: 1.0000 | ORAL_TABLET | Freq: Two times a day (BID) | ORAL | 0 refills | Status: AC

## 2022-01-21 NOTE — Progress Notes (Signed)
Subjective: [name] is a 61 y.o. male patient seen in office for diabetic nail care.  Patient is also assisted by daughter who reports that he has had infection in his legs wrapped for the last 2 weeks since he went to the primary doctor and has not touched them and will not let her see them states that she had to come over to his house to take a look at his legs in order to convince him that he needed to come to the doctor.  Patient reports that the leg has been like this and wrapped for 2 weeks and when he saw his PCP they stated that they needed to come to see a podiatrist.  Patient denies nausea vomiting fever chills or any constitutional symptoms at this time.  Patient Active Problem List   Diagnosis Date Noted   Chronic pain 01/21/2022   Essential thrombocythemia ([place]) 01/21/2022   Leg ulcer ([facility]) 01/21/2022   Memory problem 01/21/2022   Recurrent falls 01/21/2022   Unilateral primary osteoarthritis, right knee 03/20/2021   Amblyopia 03/07/2021   Body mass index (BMI) 39.0-39.9, adult 03/07/2021   Mixed hyperlipidemia 03/07/2021   Morbid obesity ([facility]) 03/07/2021   Personal history of colonic polyps 03/07/2021   Polyneuropathy due to type 2 diabetes mellitus ([facility]) 03/07/2021   Tubular adenoma of colon 03/07/2021   Unspecified abnormal finding in specimens from other organs, systems and tissues 03/07/2021   Pure hypercholesterolemia 11/29/2020   Hyperglycemia due to type 2 diabetes mellitus ([facility]) 11/29/2020   Essential hypertension 11/29/2020   Controlled type 2 diabetes mellitus without complication ([facility]) 33/54/5625   Chronic pain of left knee 05/27/2017   Foot drop, right 05/27/2017   Patella fracture 04/07/2016   Total knee replacement status 07/13/2015   Current Outpatient Medications on File Prior to Visit  Medication Sig Dispense Refill   acarbose (PRECOSE) 50 MG tablet      AFLURIA QUADRIVALENT 0.5 ML injection      amLODipine (NORVASC) 10 MG tablet Take 10 mg by  mouth daily.  0   aspirin 325 MG tablet Take 1 tablet (325 mg total) by mouth daily. 30 tablet 0   Blood Glucose Monitoring Suppl (ONE TOUCH ULTRA 2) w/Device KIT      glipiZIDE (GLUCOTROL XL) 10 MG 24 hr tablet Take 10 mg by mouth 2 (two) times daily.     hydrochlorothiazide (HYDRODIURIL) 25 MG tablet   0   ibuprofen (ADVIL) 800 MG tablet TAKE 1 TABLET BY MOUTH 2 TIMES DAILY. 180 tablet 0   JARDIANCE 10 MG TABS tablet      lisinopril (PRINIVIL,ZESTRIL) 40 MG tablet Take 40 mg by mouth daily.  0   lisinopril (ZESTRIL) 40 MG tablet Take 1 tablet by mouth every morning.     metFORMIN (GLUCOPHAGE) 1000 MG tablet Take 1,000 mg by mouth 2 (two) times daily.  0   methocarbamol (ROBAXIN) 500 MG tablet Take 1 tablet (500 mg total) by mouth every 6 (six) hours as needed for muscle spasms. 60 tablet 0   ONE TOUCH ULTRA TEST test strip      ONETOUCH DELICA LANCETS FINE MISC      pioglitazone (ACTOS) 45 MG tablet Take 45 mg by mouth daily.  0   pravastatin (PRAVACHOL) 10 MG tablet Take 10 mg by mouth daily.  1   No current facility-administered medications on file prior to visit.   No Active Allergies  No results found for this or any previous visit (from the  past 2160 hour(s)).  Objective: There were no vitals filed for this visit.  General: Patient is awake, alert, oriented x 3 and in no acute distress.  Dermatology: Skin is warm and dry bilateral with partial-thickness ulcerations present right leg. 1.  Right anterior shin that measures 4 x 2 with fibrogranular base 2.  Right inferior shin that measures 2 x 3 cm with fibrogranular base 3.  Right inferior medial ankle measures 1.5 x 1 cm with a granular base, there is scant malodor, minimal active drainage, blanchable erythema to the right leg with multiple dry abrasions.  There is a partial-thickness ulcer left leg. 1.  Left lateral leg that measures 5 cm in length by 2 cm in width with a dry crusted over fibrogranular base there is significant  edema, blanchable erythema multiple abrasions but no active drainage and no malodor from this wound site.  Nails x10 are severely thickened and elongated consistent with onychomycosis.  There are dry skin abrasions noted to the right hallux with no signs of infection.  Vascular: Dorsalis Pedis pulse = 0/4 Bilateral,  Posterior Tibial pulse = 0/4 Bilateral,  Capillary Fill Time < 5 seconds  Neurologic: Protective sensation absent bilateral.  Musculosketal: There is no pain to palpation to ulcerated areas.  No results for input(s): GRAMSTAIN, LABORGA in the last 8760 hours.  Assessment and Plan:  Problem List Items Addressed This Visit   None Visit Diagnoses     Cellulitis of right leg    -  Primary   Relevant Orders   WOUND CULTURE   Venous ulcer of right leg (HCC)       Lymphedema       Pain due to onychomycosis of toenails of both feet       Relevant Medications   sulfamethoxazole-trimethoprim (BACTRIM) 400-80 MG tablet   Type 2 diabetes mellitus with vascular disease (HCC)       Relevant Medications   lisinopril (ZESTRIL) 40 MG tablet        -Examined patient and discussed the progression of the wound and treatment alternatives. -Recommend at this time referral to the wound care center for venous leg ulcers however I have preemptively treated patient by putting him on Bactrim antibiotic I also cultured the right leg wound today where there was significant amount of odor and active clear to yellow drainage -All wounds were thoroughly cleansed with wound cleanser and dressed with Adaptic and Medihoney and dry dressing.  Home nursing orders sent to [facility] to help with wound care 1-2 times weekly until patient can get in at the wound care center for further recommendations in [place] -Encouraged elevation of legs meanwhile to assist with edema control -Encouraged patient to continue with his diuretic to assist with edema control -Mechanically debrided nails x10  using a sterile nail nipper without incident -Return to office in 3 months for nail care or sooner if problems or issues arise  Landis Martins, DPM

## 2022-01-28 DIAGNOSIS — G8929 Other chronic pain: Secondary | ICD-10-CM | POA: Diagnosis not present

## 2022-01-28 DIAGNOSIS — E782 Mixed hyperlipidemia: Secondary | ICD-10-CM | POA: Diagnosis not present

## 2022-01-28 DIAGNOSIS — E1165 Type 2 diabetes mellitus with hyperglycemia: Secondary | ICD-10-CM | POA: Diagnosis not present

## 2022-01-28 DIAGNOSIS — I10 Essential (primary) hypertension: Secondary | ICD-10-CM | POA: Diagnosis not present

## 2022-01-29 LAB — WOUND CULTURE: Organism ID, Bacteria: NONE SEEN

## 2022-02-03 DIAGNOSIS — L97212 Non-pressure chronic ulcer of right calf with fat layer exposed: Secondary | ICD-10-CM | POA: Diagnosis not present

## 2022-02-03 DIAGNOSIS — L97812 Non-pressure chronic ulcer of other part of right lower leg with fat layer exposed: Secondary | ICD-10-CM | POA: Diagnosis not present

## 2022-02-03 DIAGNOSIS — L97822 Non-pressure chronic ulcer of other part of left lower leg with fat layer exposed: Secondary | ICD-10-CM | POA: Diagnosis not present

## 2022-02-03 DIAGNOSIS — I872 Venous insufficiency (chronic) (peripheral): Secondary | ICD-10-CM | POA: Diagnosis not present

## 2022-02-03 DIAGNOSIS — L97222 Non-pressure chronic ulcer of left calf with fat layer exposed: Secondary | ICD-10-CM | POA: Diagnosis not present

## 2022-02-05 ENCOUNTER — Ambulatory Visit: Payer: Medicare HMO

## 2022-02-18 DIAGNOSIS — I872 Venous insufficiency (chronic) (peripheral): Secondary | ICD-10-CM | POA: Diagnosis not present

## 2022-02-18 DIAGNOSIS — L97222 Non-pressure chronic ulcer of left calf with fat layer exposed: Secondary | ICD-10-CM | POA: Diagnosis not present

## 2022-02-18 DIAGNOSIS — L97822 Non-pressure chronic ulcer of other part of left lower leg with fat layer exposed: Secondary | ICD-10-CM | POA: Diagnosis not present

## 2022-02-18 DIAGNOSIS — L97812 Non-pressure chronic ulcer of other part of right lower leg with fat layer exposed: Secondary | ICD-10-CM | POA: Diagnosis not present

## 2022-02-18 DIAGNOSIS — L97212 Non-pressure chronic ulcer of right calf with fat layer exposed: Secondary | ICD-10-CM | POA: Diagnosis not present

## 2022-03-12 DIAGNOSIS — G8929 Other chronic pain: Secondary | ICD-10-CM | POA: Diagnosis not present

## 2022-03-12 DIAGNOSIS — E782 Mixed hyperlipidemia: Secondary | ICD-10-CM | POA: Diagnosis not present

## 2022-03-12 DIAGNOSIS — I10 Essential (primary) hypertension: Secondary | ICD-10-CM | POA: Diagnosis not present

## 2022-03-12 DIAGNOSIS — E1165 Type 2 diabetes mellitus with hyperglycemia: Secondary | ICD-10-CM | POA: Diagnosis not present

## 2022-03-24 DIAGNOSIS — L97212 Non-pressure chronic ulcer of right calf with fat layer exposed: Secondary | ICD-10-CM | POA: Diagnosis not present

## 2022-03-24 DIAGNOSIS — I872 Venous insufficiency (chronic) (peripheral): Secondary | ICD-10-CM | POA: Diagnosis not present

## 2022-03-24 DIAGNOSIS — L97222 Non-pressure chronic ulcer of left calf with fat layer exposed: Secondary | ICD-10-CM | POA: Diagnosis not present

## 2022-03-24 DIAGNOSIS — L97812 Non-pressure chronic ulcer of other part of right lower leg with fat layer exposed: Secondary | ICD-10-CM | POA: Diagnosis not present

## 2022-03-27 DIAGNOSIS — I10 Essential (primary) hypertension: Secondary | ICD-10-CM | POA: Diagnosis not present

## 2022-03-27 DIAGNOSIS — E1165 Type 2 diabetes mellitus with hyperglycemia: Secondary | ICD-10-CM | POA: Diagnosis not present

## 2022-03-27 DIAGNOSIS — E782 Mixed hyperlipidemia: Secondary | ICD-10-CM | POA: Diagnosis not present

## 2022-03-27 DIAGNOSIS — G8929 Other chronic pain: Secondary | ICD-10-CM | POA: Diagnosis not present

## 2022-03-27 DIAGNOSIS — M1711 Unilateral primary osteoarthritis, right knee: Secondary | ICD-10-CM | POA: Diagnosis not present

## 2022-04-14 DIAGNOSIS — L97822 Non-pressure chronic ulcer of other part of left lower leg with fat layer exposed: Secondary | ICD-10-CM | POA: Diagnosis not present

## 2022-04-14 DIAGNOSIS — L97812 Non-pressure chronic ulcer of other part of right lower leg with fat layer exposed: Secondary | ICD-10-CM | POA: Diagnosis not present

## 2022-04-14 DIAGNOSIS — L97212 Non-pressure chronic ulcer of right calf with fat layer exposed: Secondary | ICD-10-CM | POA: Diagnosis not present

## 2022-04-14 DIAGNOSIS — I872 Venous insufficiency (chronic) (peripheral): Secondary | ICD-10-CM | POA: Diagnosis not present

## 2022-04-22 ENCOUNTER — Ambulatory Visit: Payer: Medicare HMO | Admitting: Sports Medicine

## 2022-04-25 DIAGNOSIS — M1711 Unilateral primary osteoarthritis, right knee: Secondary | ICD-10-CM | POA: Diagnosis not present

## 2022-04-25 DIAGNOSIS — G8929 Other chronic pain: Secondary | ICD-10-CM | POA: Diagnosis not present

## 2022-04-25 DIAGNOSIS — E782 Mixed hyperlipidemia: Secondary | ICD-10-CM | POA: Diagnosis not present

## 2022-04-25 DIAGNOSIS — E1165 Type 2 diabetes mellitus with hyperglycemia: Secondary | ICD-10-CM | POA: Diagnosis not present

## 2022-04-25 DIAGNOSIS — I10 Essential (primary) hypertension: Secondary | ICD-10-CM | POA: Diagnosis not present

## 2022-05-05 DIAGNOSIS — L97222 Non-pressure chronic ulcer of left calf with fat layer exposed: Secondary | ICD-10-CM | POA: Diagnosis not present

## 2022-05-05 DIAGNOSIS — L97812 Non-pressure chronic ulcer of other part of right lower leg with fat layer exposed: Secondary | ICD-10-CM | POA: Diagnosis not present

## 2022-05-05 DIAGNOSIS — L97212 Non-pressure chronic ulcer of right calf with fat layer exposed: Secondary | ICD-10-CM | POA: Diagnosis not present

## 2022-05-05 DIAGNOSIS — E11622 Type 2 diabetes mellitus with other skin ulcer: Secondary | ICD-10-CM | POA: Diagnosis not present

## 2022-05-05 DIAGNOSIS — L97822 Non-pressure chronic ulcer of other part of left lower leg with fat layer exposed: Secondary | ICD-10-CM | POA: Diagnosis not present

## 2022-05-05 DIAGNOSIS — I872 Venous insufficiency (chronic) (peripheral): Secondary | ICD-10-CM | POA: Diagnosis not present

## 2022-05-13 DIAGNOSIS — L97222 Non-pressure chronic ulcer of left calf with fat layer exposed: Secondary | ICD-10-CM | POA: Diagnosis not present

## 2022-05-13 DIAGNOSIS — I872 Venous insufficiency (chronic) (peripheral): Secondary | ICD-10-CM | POA: Diagnosis not present

## 2022-05-13 DIAGNOSIS — L97212 Non-pressure chronic ulcer of right calf with fat layer exposed: Secondary | ICD-10-CM | POA: Diagnosis not present

## 2022-05-13 DIAGNOSIS — L97812 Non-pressure chronic ulcer of other part of right lower leg with fat layer exposed: Secondary | ICD-10-CM | POA: Diagnosis not present

## 2022-05-13 DIAGNOSIS — L97822 Non-pressure chronic ulcer of other part of left lower leg with fat layer exposed: Secondary | ICD-10-CM | POA: Diagnosis not present

## 2022-06-02 DIAGNOSIS — L97212 Non-pressure chronic ulcer of right calf with fat layer exposed: Secondary | ICD-10-CM | POA: Diagnosis not present

## 2022-06-02 DIAGNOSIS — I739 Peripheral vascular disease, unspecified: Secondary | ICD-10-CM | POA: Diagnosis not present

## 2022-06-02 DIAGNOSIS — L97822 Non-pressure chronic ulcer of other part of left lower leg with fat layer exposed: Secondary | ICD-10-CM | POA: Diagnosis not present

## 2022-06-02 DIAGNOSIS — L97222 Non-pressure chronic ulcer of left calf with fat layer exposed: Secondary | ICD-10-CM | POA: Diagnosis not present

## 2022-06-02 DIAGNOSIS — E119 Type 2 diabetes mellitus without complications: Secondary | ICD-10-CM | POA: Diagnosis not present

## 2022-06-02 DIAGNOSIS — I10 Essential (primary) hypertension: Secondary | ICD-10-CM | POA: Diagnosis not present

## 2022-06-02 DIAGNOSIS — L97812 Non-pressure chronic ulcer of other part of right lower leg with fat layer exposed: Secondary | ICD-10-CM | POA: Diagnosis not present

## 2022-06-02 DIAGNOSIS — I872 Venous insufficiency (chronic) (peripheral): Secondary | ICD-10-CM | POA: Diagnosis not present

## 2022-06-10 DIAGNOSIS — I83012 Varicose veins of right lower extremity with ulcer of calf: Secondary | ICD-10-CM | POA: Diagnosis not present

## 2022-06-10 DIAGNOSIS — L97812 Non-pressure chronic ulcer of other part of right lower leg with fat layer exposed: Secondary | ICD-10-CM | POA: Diagnosis not present

## 2022-06-10 DIAGNOSIS — L97222 Non-pressure chronic ulcer of left calf with fat layer exposed: Secondary | ICD-10-CM | POA: Diagnosis not present

## 2022-06-10 DIAGNOSIS — R609 Edema, unspecified: Secondary | ICD-10-CM | POA: Diagnosis not present

## 2022-06-10 DIAGNOSIS — I872 Venous insufficiency (chronic) (peripheral): Secondary | ICD-10-CM | POA: Diagnosis not present

## 2022-06-10 DIAGNOSIS — L97822 Non-pressure chronic ulcer of other part of left lower leg with fat layer exposed: Secondary | ICD-10-CM | POA: Diagnosis not present

## 2022-06-10 DIAGNOSIS — L97212 Non-pressure chronic ulcer of right calf with fat layer exposed: Secondary | ICD-10-CM | POA: Diagnosis not present

## 2022-06-24 DIAGNOSIS — I872 Venous insufficiency (chronic) (peripheral): Secondary | ICD-10-CM | POA: Diagnosis not present

## 2022-06-24 DIAGNOSIS — L97212 Non-pressure chronic ulcer of right calf with fat layer exposed: Secondary | ICD-10-CM | POA: Diagnosis not present

## 2022-06-24 DIAGNOSIS — L97222 Non-pressure chronic ulcer of left calf with fat layer exposed: Secondary | ICD-10-CM | POA: Diagnosis not present

## 2022-06-24 DIAGNOSIS — L97812 Non-pressure chronic ulcer of other part of right lower leg with fat layer exposed: Secondary | ICD-10-CM | POA: Diagnosis not present

## 2022-06-24 DIAGNOSIS — L97822 Non-pressure chronic ulcer of other part of left lower leg with fat layer exposed: Secondary | ICD-10-CM | POA: Diagnosis not present

## 2022-06-24 DIAGNOSIS — E11622 Type 2 diabetes mellitus with other skin ulcer: Secondary | ICD-10-CM | POA: Diagnosis not present

## 2022-07-01 DIAGNOSIS — L97212 Non-pressure chronic ulcer of right calf with fat layer exposed: Secondary | ICD-10-CM | POA: Diagnosis not present

## 2022-07-01 DIAGNOSIS — I872 Venous insufficiency (chronic) (peripheral): Secondary | ICD-10-CM | POA: Diagnosis not present

## 2022-07-01 DIAGNOSIS — L97222 Non-pressure chronic ulcer of left calf with fat layer exposed: Secondary | ICD-10-CM | POA: Diagnosis not present

## 2022-07-01 DIAGNOSIS — L97822 Non-pressure chronic ulcer of other part of left lower leg with fat layer exposed: Secondary | ICD-10-CM | POA: Diagnosis not present

## 2022-07-01 DIAGNOSIS — L97812 Non-pressure chronic ulcer of other part of right lower leg with fat layer exposed: Secondary | ICD-10-CM | POA: Diagnosis not present

## 2022-07-11 ENCOUNTER — Telehealth: Payer: Self-pay

## 2022-07-11 NOTE — Patient Outreach (Signed)
  Care Management   Outreach Note  07/11/2022 Name: [name] MRN: [id number] DOB: [birth year]  An unsuccessful telephone outreach was attempted today. The patient was referred to the case management team for assistance with care management and care coordination.   Follow Up Plan:  A HIPAA compliant voice message was left today requesting a return call.   [facility] [phone]

## 2022-07-14 DIAGNOSIS — L97822 Non-pressure chronic ulcer of other part of left lower leg with fat layer exposed: Secondary | ICD-10-CM | POA: Diagnosis not present

## 2022-07-14 DIAGNOSIS — I872 Venous insufficiency (chronic) (peripheral): Secondary | ICD-10-CM | POA: Diagnosis not present

## 2022-07-14 DIAGNOSIS — L97222 Non-pressure chronic ulcer of left calf with fat layer exposed: Secondary | ICD-10-CM | POA: Diagnosis not present

## 2022-07-14 DIAGNOSIS — L97212 Non-pressure chronic ulcer of right calf with fat layer exposed: Secondary | ICD-10-CM | POA: Diagnosis not present

## 2022-07-14 DIAGNOSIS — L97812 Non-pressure chronic ulcer of other part of right lower leg with fat layer exposed: Secondary | ICD-10-CM | POA: Diagnosis not present

## 2022-09-24 DIAGNOSIS — G8929 Other chronic pain: Secondary | ICD-10-CM | POA: Diagnosis not present

## 2022-09-24 DIAGNOSIS — E1165 Type 2 diabetes mellitus with hyperglycemia: Secondary | ICD-10-CM | POA: Diagnosis not present

## 2022-09-24 DIAGNOSIS — I10 Essential (primary) hypertension: Secondary | ICD-10-CM | POA: Diagnosis not present

## 2022-09-24 DIAGNOSIS — E782 Mixed hyperlipidemia: Secondary | ICD-10-CM | POA: Diagnosis not present

## 2022-10-24 DIAGNOSIS — G8929 Other chronic pain: Secondary | ICD-10-CM | POA: Diagnosis not present

## 2022-10-24 DIAGNOSIS — E1165 Type 2 diabetes mellitus with hyperglycemia: Secondary | ICD-10-CM | POA: Diagnosis not present

## 2022-10-24 DIAGNOSIS — I10 Essential (primary) hypertension: Secondary | ICD-10-CM | POA: Diagnosis not present

## 2022-10-24 DIAGNOSIS — E782 Mixed hyperlipidemia: Secondary | ICD-10-CM | POA: Diagnosis not present

## 2022-10-24 DIAGNOSIS — M1711 Unilateral primary osteoarthritis, right knee: Secondary | ICD-10-CM | POA: Diagnosis not present

## 2022-12-03 DIAGNOSIS — E782 Mixed hyperlipidemia: Secondary | ICD-10-CM | POA: Diagnosis not present

## 2022-12-03 DIAGNOSIS — Z6835 Body mass index (BMI) 35.0-35.9, adult: Secondary | ICD-10-CM | POA: Diagnosis not present

## 2022-12-03 DIAGNOSIS — I10 Essential (primary) hypertension: Secondary | ICD-10-CM | POA: Diagnosis not present

## 2022-12-03 DIAGNOSIS — E1159 Type 2 diabetes mellitus with other circulatory complications: Secondary | ICD-10-CM | POA: Diagnosis not present

## 2022-12-22 DIAGNOSIS — I10 Essential (primary) hypertension: Secondary | ICD-10-CM | POA: Diagnosis not present

## 2022-12-22 DIAGNOSIS — E1165 Type 2 diabetes mellitus with hyperglycemia: Secondary | ICD-10-CM | POA: Diagnosis not present

## 2022-12-22 DIAGNOSIS — E782 Mixed hyperlipidemia: Secondary | ICD-10-CM | POA: Diagnosis not present

## 2022-12-22 DIAGNOSIS — M1711 Unilateral primary osteoarthritis, right knee: Secondary | ICD-10-CM | POA: Diagnosis not present

## 2022-12-30 DIAGNOSIS — R413 Other amnesia: Secondary | ICD-10-CM | POA: Diagnosis not present

## 2022-12-30 DIAGNOSIS — E1169 Type 2 diabetes mellitus with other specified complication: Secondary | ICD-10-CM | POA: Diagnosis not present

## 2022-12-30 DIAGNOSIS — E785 Hyperlipidemia, unspecified: Secondary | ICD-10-CM | POA: Diagnosis not present

## 2022-12-30 DIAGNOSIS — M21371 Foot drop, right foot: Secondary | ICD-10-CM | POA: Diagnosis not present

## 2023-01-30 DIAGNOSIS — E1169 Type 2 diabetes mellitus with other specified complication: Secondary | ICD-10-CM | POA: Diagnosis not present

## 2023-01-30 DIAGNOSIS — E785 Hyperlipidemia, unspecified: Secondary | ICD-10-CM | POA: Diagnosis not present

## 2023-01-30 DIAGNOSIS — R413 Other amnesia: Secondary | ICD-10-CM | POA: Diagnosis not present

## 2023-01-30 DIAGNOSIS — M21371 Foot drop, right foot: Secondary | ICD-10-CM | POA: Diagnosis not present

## 2023-02-21 DIAGNOSIS — E785 Hyperlipidemia, unspecified: Secondary | ICD-10-CM | POA: Diagnosis not present

## 2023-02-21 DIAGNOSIS — E1169 Type 2 diabetes mellitus with other specified complication: Secondary | ICD-10-CM | POA: Diagnosis not present

## 2023-02-21 DIAGNOSIS — R32 Unspecified urinary incontinence: Secondary | ICD-10-CM | POA: Diagnosis not present

## 2023-02-21 DIAGNOSIS — Z7984 Long term (current) use of oral hypoglycemic drugs: Secondary | ICD-10-CM | POA: Diagnosis not present

## 2023-02-21 DIAGNOSIS — I10 Essential (primary) hypertension: Secondary | ICD-10-CM | POA: Diagnosis not present

## 2023-02-21 DIAGNOSIS — R413 Other amnesia: Secondary | ICD-10-CM | POA: Diagnosis not present

## 2023-02-21 DIAGNOSIS — M21371 Foot drop, right foot: Secondary | ICD-10-CM | POA: Diagnosis not present

## 2023-02-21 DIAGNOSIS — N3281 Overactive bladder: Secondary | ICD-10-CM | POA: Diagnosis not present

## 2023-02-21 DIAGNOSIS — Z9181 History of falling: Secondary | ICD-10-CM | POA: Diagnosis not present

## 2023-02-24 DIAGNOSIS — N3281 Overactive bladder: Secondary | ICD-10-CM | POA: Diagnosis not present

## 2023-02-24 DIAGNOSIS — R32 Unspecified urinary incontinence: Secondary | ICD-10-CM | POA: Diagnosis not present

## 2023-02-24 DIAGNOSIS — E785 Hyperlipidemia, unspecified: Secondary | ICD-10-CM | POA: Diagnosis not present

## 2023-02-24 DIAGNOSIS — M21371 Foot drop, right foot: Secondary | ICD-10-CM | POA: Diagnosis not present

## 2023-02-24 DIAGNOSIS — Z7984 Long term (current) use of oral hypoglycemic drugs: Secondary | ICD-10-CM | POA: Diagnosis not present

## 2023-02-24 DIAGNOSIS — Z9181 History of falling: Secondary | ICD-10-CM | POA: Diagnosis not present

## 2023-02-24 DIAGNOSIS — E1169 Type 2 diabetes mellitus with other specified complication: Secondary | ICD-10-CM | POA: Diagnosis not present

## 2023-02-24 DIAGNOSIS — I10 Essential (primary) hypertension: Secondary | ICD-10-CM | POA: Diagnosis not present

## 2023-02-24 DIAGNOSIS — R413 Other amnesia: Secondary | ICD-10-CM | POA: Diagnosis not present

## 2023-02-28 DIAGNOSIS — R32 Unspecified urinary incontinence: Secondary | ICD-10-CM | POA: Diagnosis not present

## 2023-02-28 DIAGNOSIS — E1169 Type 2 diabetes mellitus with other specified complication: Secondary | ICD-10-CM | POA: Diagnosis not present

## 2023-02-28 DIAGNOSIS — I10 Essential (primary) hypertension: Secondary | ICD-10-CM | POA: Diagnosis not present

## 2023-02-28 DIAGNOSIS — M21371 Foot drop, right foot: Secondary | ICD-10-CM | POA: Diagnosis not present

## 2023-02-28 DIAGNOSIS — Z7984 Long term (current) use of oral hypoglycemic drugs: Secondary | ICD-10-CM | POA: Diagnosis not present

## 2023-02-28 DIAGNOSIS — N3281 Overactive bladder: Secondary | ICD-10-CM | POA: Diagnosis not present

## 2023-02-28 DIAGNOSIS — E785 Hyperlipidemia, unspecified: Secondary | ICD-10-CM | POA: Diagnosis not present

## 2023-02-28 DIAGNOSIS — R413 Other amnesia: Secondary | ICD-10-CM | POA: Diagnosis not present

## 2023-02-28 DIAGNOSIS — Z9181 History of falling: Secondary | ICD-10-CM | POA: Diagnosis not present

## 2023-03-06 DIAGNOSIS — I10 Essential (primary) hypertension: Secondary | ICD-10-CM | POA: Diagnosis not present

## 2023-03-06 DIAGNOSIS — E1169 Type 2 diabetes mellitus with other specified complication: Secondary | ICD-10-CM | POA: Diagnosis not present

## 2023-03-06 DIAGNOSIS — R413 Other amnesia: Secondary | ICD-10-CM | POA: Diagnosis not present

## 2023-03-06 DIAGNOSIS — Z7984 Long term (current) use of oral hypoglycemic drugs: Secondary | ICD-10-CM | POA: Diagnosis not present

## 2023-03-06 DIAGNOSIS — M21371 Foot drop, right foot: Secondary | ICD-10-CM | POA: Diagnosis not present

## 2023-03-06 DIAGNOSIS — R32 Unspecified urinary incontinence: Secondary | ICD-10-CM | POA: Diagnosis not present

## 2023-03-06 DIAGNOSIS — Z9181 History of falling: Secondary | ICD-10-CM | POA: Diagnosis not present

## 2023-03-06 DIAGNOSIS — E785 Hyperlipidemia, unspecified: Secondary | ICD-10-CM | POA: Diagnosis not present

## 2023-03-06 DIAGNOSIS — N3281 Overactive bladder: Secondary | ICD-10-CM | POA: Diagnosis not present

## 2023-03-10 DIAGNOSIS — E785 Hyperlipidemia, unspecified: Secondary | ICD-10-CM | POA: Diagnosis not present

## 2023-03-10 DIAGNOSIS — N3281 Overactive bladder: Secondary | ICD-10-CM | POA: Diagnosis not present

## 2023-03-10 DIAGNOSIS — I10 Essential (primary) hypertension: Secondary | ICD-10-CM | POA: Diagnosis not present

## 2023-03-10 DIAGNOSIS — M21371 Foot drop, right foot: Secondary | ICD-10-CM | POA: Diagnosis not present

## 2023-03-10 DIAGNOSIS — Z9181 History of falling: Secondary | ICD-10-CM | POA: Diagnosis not present

## 2023-03-10 DIAGNOSIS — Z7984 Long term (current) use of oral hypoglycemic drugs: Secondary | ICD-10-CM | POA: Diagnosis not present

## 2023-03-10 DIAGNOSIS — R32 Unspecified urinary incontinence: Secondary | ICD-10-CM | POA: Diagnosis not present

## 2023-03-10 DIAGNOSIS — E1169 Type 2 diabetes mellitus with other specified complication: Secondary | ICD-10-CM | POA: Diagnosis not present

## 2023-03-10 DIAGNOSIS — R413 Other amnesia: Secondary | ICD-10-CM | POA: Diagnosis not present

## 2023-03-17 DIAGNOSIS — M21371 Foot drop, right foot: Secondary | ICD-10-CM | POA: Diagnosis not present

## 2023-03-17 DIAGNOSIS — R32 Unspecified urinary incontinence: Secondary | ICD-10-CM | POA: Diagnosis not present

## 2023-03-17 DIAGNOSIS — R413 Other amnesia: Secondary | ICD-10-CM | POA: Diagnosis not present

## 2023-03-17 DIAGNOSIS — Z9181 History of falling: Secondary | ICD-10-CM | POA: Diagnosis not present

## 2023-03-17 DIAGNOSIS — Z7984 Long term (current) use of oral hypoglycemic drugs: Secondary | ICD-10-CM | POA: Diagnosis not present

## 2023-03-17 DIAGNOSIS — N3281 Overactive bladder: Secondary | ICD-10-CM | POA: Diagnosis not present

## 2023-03-17 DIAGNOSIS — E1169 Type 2 diabetes mellitus with other specified complication: Secondary | ICD-10-CM | POA: Diagnosis not present

## 2023-03-17 DIAGNOSIS — I10 Essential (primary) hypertension: Secondary | ICD-10-CM | POA: Diagnosis not present

## 2023-03-17 DIAGNOSIS — E785 Hyperlipidemia, unspecified: Secondary | ICD-10-CM | POA: Diagnosis not present

## 2023-03-23 DIAGNOSIS — R413 Other amnesia: Secondary | ICD-10-CM | POA: Diagnosis not present

## 2023-03-23 DIAGNOSIS — I10 Essential (primary) hypertension: Secondary | ICD-10-CM | POA: Diagnosis not present

## 2023-03-23 DIAGNOSIS — N3281 Overactive bladder: Secondary | ICD-10-CM | POA: Diagnosis not present

## 2023-03-23 DIAGNOSIS — Z7984 Long term (current) use of oral hypoglycemic drugs: Secondary | ICD-10-CM | POA: Diagnosis not present

## 2023-03-23 DIAGNOSIS — Z9181 History of falling: Secondary | ICD-10-CM | POA: Diagnosis not present

## 2023-03-23 DIAGNOSIS — E785 Hyperlipidemia, unspecified: Secondary | ICD-10-CM | POA: Diagnosis not present

## 2023-03-23 DIAGNOSIS — M21371 Foot drop, right foot: Secondary | ICD-10-CM | POA: Diagnosis not present

## 2023-03-23 DIAGNOSIS — R32 Unspecified urinary incontinence: Secondary | ICD-10-CM | POA: Diagnosis not present

## 2023-03-23 DIAGNOSIS — E1169 Type 2 diabetes mellitus with other specified complication: Secondary | ICD-10-CM | POA: Diagnosis not present

## 2023-03-26 DIAGNOSIS — R32 Unspecified urinary incontinence: Secondary | ICD-10-CM | POA: Diagnosis not present

## 2023-03-26 DIAGNOSIS — R413 Other amnesia: Secondary | ICD-10-CM | POA: Diagnosis not present

## 2023-03-26 DIAGNOSIS — N3281 Overactive bladder: Secondary | ICD-10-CM | POA: Diagnosis not present

## 2023-03-26 DIAGNOSIS — E1169 Type 2 diabetes mellitus with other specified complication: Secondary | ICD-10-CM | POA: Diagnosis not present

## 2023-03-26 DIAGNOSIS — E785 Hyperlipidemia, unspecified: Secondary | ICD-10-CM | POA: Diagnosis not present

## 2023-03-26 DIAGNOSIS — I10 Essential (primary) hypertension: Secondary | ICD-10-CM | POA: Diagnosis not present

## 2023-03-26 DIAGNOSIS — M21371 Foot drop, right foot: Secondary | ICD-10-CM | POA: Diagnosis not present

## 2023-03-26 DIAGNOSIS — Z9181 History of falling: Secondary | ICD-10-CM | POA: Diagnosis not present

## 2023-03-26 DIAGNOSIS — Z7984 Long term (current) use of oral hypoglycemic drugs: Secondary | ICD-10-CM | POA: Diagnosis not present

## 2023-03-30 DIAGNOSIS — I10 Essential (primary) hypertension: Secondary | ICD-10-CM | POA: Diagnosis not present

## 2023-03-30 DIAGNOSIS — R413 Other amnesia: Secondary | ICD-10-CM | POA: Diagnosis not present

## 2023-03-30 DIAGNOSIS — E1169 Type 2 diabetes mellitus with other specified complication: Secondary | ICD-10-CM | POA: Diagnosis not present

## 2023-03-30 DIAGNOSIS — E785 Hyperlipidemia, unspecified: Secondary | ICD-10-CM | POA: Diagnosis not present

## 2023-03-30 DIAGNOSIS — M21371 Foot drop, right foot: Secondary | ICD-10-CM | POA: Diagnosis not present

## 2023-03-31 DIAGNOSIS — Z9181 History of falling: Secondary | ICD-10-CM | POA: Diagnosis not present

## 2023-03-31 DIAGNOSIS — E785 Hyperlipidemia, unspecified: Secondary | ICD-10-CM | POA: Diagnosis not present

## 2023-03-31 DIAGNOSIS — E1169 Type 2 diabetes mellitus with other specified complication: Secondary | ICD-10-CM | POA: Diagnosis not present

## 2023-03-31 DIAGNOSIS — R413 Other amnesia: Secondary | ICD-10-CM | POA: Diagnosis not present

## 2023-03-31 DIAGNOSIS — Z7984 Long term (current) use of oral hypoglycemic drugs: Secondary | ICD-10-CM | POA: Diagnosis not present

## 2023-03-31 DIAGNOSIS — R32 Unspecified urinary incontinence: Secondary | ICD-10-CM | POA: Diagnosis not present

## 2023-03-31 DIAGNOSIS — I10 Essential (primary) hypertension: Secondary | ICD-10-CM | POA: Diagnosis not present

## 2023-03-31 DIAGNOSIS — N3281 Overactive bladder: Secondary | ICD-10-CM | POA: Diagnosis not present

## 2023-03-31 DIAGNOSIS — M21371 Foot drop, right foot: Secondary | ICD-10-CM | POA: Diagnosis not present

## 2023-04-02 DIAGNOSIS — M21371 Foot drop, right foot: Secondary | ICD-10-CM | POA: Diagnosis not present

## 2023-04-02 DIAGNOSIS — Z7984 Long term (current) use of oral hypoglycemic drugs: Secondary | ICD-10-CM | POA: Diagnosis not present

## 2023-04-02 DIAGNOSIS — R413 Other amnesia: Secondary | ICD-10-CM | POA: Diagnosis not present

## 2023-04-02 DIAGNOSIS — I10 Essential (primary) hypertension: Secondary | ICD-10-CM | POA: Diagnosis not present

## 2023-04-02 DIAGNOSIS — R32 Unspecified urinary incontinence: Secondary | ICD-10-CM | POA: Diagnosis not present

## 2023-04-02 DIAGNOSIS — E1169 Type 2 diabetes mellitus with other specified complication: Secondary | ICD-10-CM | POA: Diagnosis not present

## 2023-04-02 DIAGNOSIS — N3281 Overactive bladder: Secondary | ICD-10-CM | POA: Diagnosis not present

## 2023-04-02 DIAGNOSIS — E785 Hyperlipidemia, unspecified: Secondary | ICD-10-CM | POA: Diagnosis not present

## 2023-04-02 DIAGNOSIS — Z9181 History of falling: Secondary | ICD-10-CM | POA: Diagnosis not present

## 2023-04-07 DIAGNOSIS — I10 Essential (primary) hypertension: Secondary | ICD-10-CM | POA: Diagnosis not present

## 2023-04-07 DIAGNOSIS — Z9181 History of falling: Secondary | ICD-10-CM | POA: Diagnosis not present

## 2023-04-07 DIAGNOSIS — Z7984 Long term (current) use of oral hypoglycemic drugs: Secondary | ICD-10-CM | POA: Diagnosis not present

## 2023-04-07 DIAGNOSIS — E1169 Type 2 diabetes mellitus with other specified complication: Secondary | ICD-10-CM | POA: Diagnosis not present

## 2023-04-07 DIAGNOSIS — N3281 Overactive bladder: Secondary | ICD-10-CM | POA: Diagnosis not present

## 2023-04-07 DIAGNOSIS — R413 Other amnesia: Secondary | ICD-10-CM | POA: Diagnosis not present

## 2023-04-07 DIAGNOSIS — R32 Unspecified urinary incontinence: Secondary | ICD-10-CM | POA: Diagnosis not present

## 2023-04-07 DIAGNOSIS — M21371 Foot drop, right foot: Secondary | ICD-10-CM | POA: Diagnosis not present

## 2023-04-07 DIAGNOSIS — E785 Hyperlipidemia, unspecified: Secondary | ICD-10-CM | POA: Diagnosis not present

## 2023-04-09 DIAGNOSIS — E785 Hyperlipidemia, unspecified: Secondary | ICD-10-CM | POA: Diagnosis not present

## 2023-04-09 DIAGNOSIS — E1169 Type 2 diabetes mellitus with other specified complication: Secondary | ICD-10-CM | POA: Diagnosis not present

## 2023-04-09 DIAGNOSIS — R413 Other amnesia: Secondary | ICD-10-CM | POA: Diagnosis not present

## 2023-04-09 DIAGNOSIS — M21371 Foot drop, right foot: Secondary | ICD-10-CM | POA: Diagnosis not present

## 2023-04-09 DIAGNOSIS — R32 Unspecified urinary incontinence: Secondary | ICD-10-CM | POA: Diagnosis not present

## 2023-04-09 DIAGNOSIS — Z7984 Long term (current) use of oral hypoglycemic drugs: Secondary | ICD-10-CM | POA: Diagnosis not present

## 2023-04-09 DIAGNOSIS — I10 Essential (primary) hypertension: Secondary | ICD-10-CM | POA: Diagnosis not present

## 2023-04-09 DIAGNOSIS — Z9181 History of falling: Secondary | ICD-10-CM | POA: Diagnosis not present

## 2023-04-09 DIAGNOSIS — N3281 Overactive bladder: Secondary | ICD-10-CM | POA: Diagnosis not present

## 2023-04-17 DIAGNOSIS — E1169 Type 2 diabetes mellitus with other specified complication: Secondary | ICD-10-CM | POA: Diagnosis not present

## 2023-04-17 DIAGNOSIS — E785 Hyperlipidemia, unspecified: Secondary | ICD-10-CM | POA: Diagnosis not present

## 2023-04-17 DIAGNOSIS — I10 Essential (primary) hypertension: Secondary | ICD-10-CM | POA: Diagnosis not present

## 2023-04-17 DIAGNOSIS — M21371 Foot drop, right foot: Secondary | ICD-10-CM | POA: Diagnosis not present

## 2023-04-17 DIAGNOSIS — Z9181 History of falling: Secondary | ICD-10-CM | POA: Diagnosis not present

## 2023-04-17 DIAGNOSIS — R413 Other amnesia: Secondary | ICD-10-CM | POA: Diagnosis not present

## 2023-04-17 DIAGNOSIS — R32 Unspecified urinary incontinence: Secondary | ICD-10-CM | POA: Diagnosis not present

## 2023-04-17 DIAGNOSIS — N3281 Overactive bladder: Secondary | ICD-10-CM | POA: Diagnosis not present

## 2023-04-17 DIAGNOSIS — Z7984 Long term (current) use of oral hypoglycemic drugs: Secondary | ICD-10-CM | POA: Diagnosis not present

## 2023-04-22 DIAGNOSIS — Z9181 History of falling: Secondary | ICD-10-CM | POA: Diagnosis not present

## 2023-04-22 DIAGNOSIS — Z7984 Long term (current) use of oral hypoglycemic drugs: Secondary | ICD-10-CM | POA: Diagnosis not present

## 2023-04-22 DIAGNOSIS — I10 Essential (primary) hypertension: Secondary | ICD-10-CM | POA: Diagnosis not present

## 2023-04-22 DIAGNOSIS — E785 Hyperlipidemia, unspecified: Secondary | ICD-10-CM | POA: Diagnosis not present

## 2023-04-22 DIAGNOSIS — E1169 Type 2 diabetes mellitus with other specified complication: Secondary | ICD-10-CM | POA: Diagnosis not present

## 2023-04-22 DIAGNOSIS — R413 Other amnesia: Secondary | ICD-10-CM | POA: Diagnosis not present

## 2023-04-22 DIAGNOSIS — N3281 Overactive bladder: Secondary | ICD-10-CM | POA: Diagnosis not present

## 2023-04-22 DIAGNOSIS — R32 Unspecified urinary incontinence: Secondary | ICD-10-CM | POA: Diagnosis not present

## 2023-04-22 DIAGNOSIS — M21371 Foot drop, right foot: Secondary | ICD-10-CM | POA: Diagnosis not present

## 2023-04-24 DIAGNOSIS — E785 Hyperlipidemia, unspecified: Secondary | ICD-10-CM | POA: Diagnosis not present

## 2023-04-24 DIAGNOSIS — R413 Other amnesia: Secondary | ICD-10-CM | POA: Diagnosis not present

## 2023-04-24 DIAGNOSIS — Z7984 Long term (current) use of oral hypoglycemic drugs: Secondary | ICD-10-CM | POA: Diagnosis not present

## 2023-04-24 DIAGNOSIS — N3281 Overactive bladder: Secondary | ICD-10-CM | POA: Diagnosis not present

## 2023-04-24 DIAGNOSIS — M21371 Foot drop, right foot: Secondary | ICD-10-CM | POA: Diagnosis not present

## 2023-04-24 DIAGNOSIS — Z9181 History of falling: Secondary | ICD-10-CM | POA: Diagnosis not present

## 2023-04-24 DIAGNOSIS — R32 Unspecified urinary incontinence: Secondary | ICD-10-CM | POA: Diagnosis not present

## 2023-04-24 DIAGNOSIS — I10 Essential (primary) hypertension: Secondary | ICD-10-CM | POA: Diagnosis not present

## 2023-04-24 DIAGNOSIS — E1169 Type 2 diabetes mellitus with other specified complication: Secondary | ICD-10-CM | POA: Diagnosis not present

## 2023-04-28 DIAGNOSIS — R413 Other amnesia: Secondary | ICD-10-CM | POA: Diagnosis not present

## 2023-04-28 DIAGNOSIS — R32 Unspecified urinary incontinence: Secondary | ICD-10-CM | POA: Diagnosis not present

## 2023-04-28 DIAGNOSIS — N3281 Overactive bladder: Secondary | ICD-10-CM | POA: Diagnosis not present

## 2023-04-28 DIAGNOSIS — E1169 Type 2 diabetes mellitus with other specified complication: Secondary | ICD-10-CM | POA: Diagnosis not present

## 2023-04-28 DIAGNOSIS — E785 Hyperlipidemia, unspecified: Secondary | ICD-10-CM | POA: Diagnosis not present

## 2023-04-28 DIAGNOSIS — Z9181 History of falling: Secondary | ICD-10-CM | POA: Diagnosis not present

## 2023-04-28 DIAGNOSIS — I10 Essential (primary) hypertension: Secondary | ICD-10-CM | POA: Diagnosis not present

## 2023-04-28 DIAGNOSIS — M21371 Foot drop, right foot: Secondary | ICD-10-CM | POA: Diagnosis not present

## 2023-04-28 DIAGNOSIS — Z7984 Long term (current) use of oral hypoglycemic drugs: Secondary | ICD-10-CM | POA: Diagnosis not present

## 2023-05-06 DIAGNOSIS — Z7984 Long term (current) use of oral hypoglycemic drugs: Secondary | ICD-10-CM | POA: Diagnosis not present

## 2023-05-06 DIAGNOSIS — E1169 Type 2 diabetes mellitus with other specified complication: Secondary | ICD-10-CM | POA: Diagnosis not present

## 2023-05-06 DIAGNOSIS — E785 Hyperlipidemia, unspecified: Secondary | ICD-10-CM | POA: Diagnosis not present

## 2023-05-06 DIAGNOSIS — R413 Other amnesia: Secondary | ICD-10-CM | POA: Diagnosis not present

## 2023-05-06 DIAGNOSIS — M21371 Foot drop, right foot: Secondary | ICD-10-CM | POA: Diagnosis not present

## 2023-05-06 DIAGNOSIS — I10 Essential (primary) hypertension: Secondary | ICD-10-CM | POA: Diagnosis not present

## 2023-05-06 DIAGNOSIS — Z9181 History of falling: Secondary | ICD-10-CM | POA: Diagnosis not present

## 2023-05-06 DIAGNOSIS — N3281 Overactive bladder: Secondary | ICD-10-CM | POA: Diagnosis not present

## 2023-05-06 DIAGNOSIS — R32 Unspecified urinary incontinence: Secondary | ICD-10-CM | POA: Diagnosis not present

## 2023-05-12 DIAGNOSIS — M21371 Foot drop, right foot: Secondary | ICD-10-CM | POA: Diagnosis not present

## 2023-05-12 DIAGNOSIS — N3281 Overactive bladder: Secondary | ICD-10-CM | POA: Diagnosis not present

## 2023-05-12 DIAGNOSIS — E1169 Type 2 diabetes mellitus with other specified complication: Secondary | ICD-10-CM | POA: Diagnosis not present

## 2023-05-12 DIAGNOSIS — R32 Unspecified urinary incontinence: Secondary | ICD-10-CM | POA: Diagnosis not present

## 2023-05-12 DIAGNOSIS — R413 Other amnesia: Secondary | ICD-10-CM | POA: Diagnosis not present

## 2023-05-12 DIAGNOSIS — Z9181 History of falling: Secondary | ICD-10-CM | POA: Diagnosis not present

## 2023-05-12 DIAGNOSIS — Z7984 Long term (current) use of oral hypoglycemic drugs: Secondary | ICD-10-CM | POA: Diagnosis not present

## 2023-05-12 DIAGNOSIS — I10 Essential (primary) hypertension: Secondary | ICD-10-CM | POA: Diagnosis not present

## 2023-05-12 DIAGNOSIS — E785 Hyperlipidemia, unspecified: Secondary | ICD-10-CM | POA: Diagnosis not present

## 2023-05-20 ENCOUNTER — Ambulatory Visit: Payer: Medicare HMO | Admitting: Podiatry

## 2023-05-20 DIAGNOSIS — Z7984 Long term (current) use of oral hypoglycemic drugs: Secondary | ICD-10-CM | POA: Diagnosis not present

## 2023-05-20 DIAGNOSIS — Z9181 History of falling: Secondary | ICD-10-CM | POA: Diagnosis not present

## 2023-05-20 DIAGNOSIS — M21371 Foot drop, right foot: Secondary | ICD-10-CM | POA: Diagnosis not present

## 2023-05-20 DIAGNOSIS — R413 Other amnesia: Secondary | ICD-10-CM | POA: Diagnosis not present

## 2023-05-20 DIAGNOSIS — R32 Unspecified urinary incontinence: Secondary | ICD-10-CM | POA: Diagnosis not present

## 2023-05-20 DIAGNOSIS — I10 Essential (primary) hypertension: Secondary | ICD-10-CM | POA: Diagnosis not present

## 2023-05-20 DIAGNOSIS — E785 Hyperlipidemia, unspecified: Secondary | ICD-10-CM | POA: Diagnosis not present

## 2023-05-20 DIAGNOSIS — E1169 Type 2 diabetes mellitus with other specified complication: Secondary | ICD-10-CM | POA: Diagnosis not present

## 2023-05-20 DIAGNOSIS — N3281 Overactive bladder: Secondary | ICD-10-CM | POA: Diagnosis not present

## 2023-05-22 DIAGNOSIS — M21371 Foot drop, right foot: Secondary | ICD-10-CM | POA: Diagnosis not present

## 2023-05-22 DIAGNOSIS — Z7984 Long term (current) use of oral hypoglycemic drugs: Secondary | ICD-10-CM | POA: Diagnosis not present

## 2023-05-22 DIAGNOSIS — R32 Unspecified urinary incontinence: Secondary | ICD-10-CM | POA: Diagnosis not present

## 2023-05-22 DIAGNOSIS — R413 Other amnesia: Secondary | ICD-10-CM | POA: Diagnosis not present

## 2023-05-22 DIAGNOSIS — Z9181 History of falling: Secondary | ICD-10-CM | POA: Diagnosis not present

## 2023-05-22 DIAGNOSIS — E785 Hyperlipidemia, unspecified: Secondary | ICD-10-CM | POA: Diagnosis not present

## 2023-05-22 DIAGNOSIS — E1169 Type 2 diabetes mellitus with other specified complication: Secondary | ICD-10-CM | POA: Diagnosis not present

## 2023-05-22 DIAGNOSIS — N3281 Overactive bladder: Secondary | ICD-10-CM | POA: Diagnosis not present

## 2023-05-22 DIAGNOSIS — I10 Essential (primary) hypertension: Secondary | ICD-10-CM | POA: Diagnosis not present

## 2023-05-26 DIAGNOSIS — M21371 Foot drop, right foot: Secondary | ICD-10-CM | POA: Diagnosis not present

## 2023-05-26 DIAGNOSIS — R32 Unspecified urinary incontinence: Secondary | ICD-10-CM | POA: Diagnosis not present

## 2023-05-26 DIAGNOSIS — E1169 Type 2 diabetes mellitus with other specified complication: Secondary | ICD-10-CM | POA: Diagnosis not present

## 2023-05-26 DIAGNOSIS — R413 Other amnesia: Secondary | ICD-10-CM | POA: Diagnosis not present

## 2023-05-26 DIAGNOSIS — Z9181 History of falling: Secondary | ICD-10-CM | POA: Diagnosis not present

## 2023-05-26 DIAGNOSIS — N3281 Overactive bladder: Secondary | ICD-10-CM | POA: Diagnosis not present

## 2023-05-26 DIAGNOSIS — I10 Essential (primary) hypertension: Secondary | ICD-10-CM | POA: Diagnosis not present

## 2023-05-26 DIAGNOSIS — E785 Hyperlipidemia, unspecified: Secondary | ICD-10-CM | POA: Diagnosis not present

## 2023-05-26 DIAGNOSIS — Z7984 Long term (current) use of oral hypoglycemic drugs: Secondary | ICD-10-CM | POA: Diagnosis not present

## 2023-06-01 ENCOUNTER — Ambulatory Visit (INDEPENDENT_AMBULATORY_CARE_PROVIDER_SITE_OTHER): Payer: Medicare Other | Admitting: Podiatry

## 2023-06-01 DIAGNOSIS — Z91199 Patient's noncompliance with other medical treatment and regimen due to unspecified reason: Secondary | ICD-10-CM

## 2023-06-01 NOTE — Progress Notes (Signed)
Pt was a no show for apt, CG 

## 2023-06-02 DIAGNOSIS — E785 Hyperlipidemia, unspecified: Secondary | ICD-10-CM | POA: Diagnosis not present

## 2023-06-02 DIAGNOSIS — I10 Essential (primary) hypertension: Secondary | ICD-10-CM | POA: Diagnosis not present

## 2023-06-02 DIAGNOSIS — Z7984 Long term (current) use of oral hypoglycemic drugs: Secondary | ICD-10-CM | POA: Diagnosis not present

## 2023-06-02 DIAGNOSIS — M21371 Foot drop, right foot: Secondary | ICD-10-CM | POA: Diagnosis not present

## 2023-06-02 DIAGNOSIS — R32 Unspecified urinary incontinence: Secondary | ICD-10-CM | POA: Diagnosis not present

## 2023-06-02 DIAGNOSIS — E1169 Type 2 diabetes mellitus with other specified complication: Secondary | ICD-10-CM | POA: Diagnosis not present

## 2023-06-02 DIAGNOSIS — Z9181 History of falling: Secondary | ICD-10-CM | POA: Diagnosis not present

## 2023-06-02 DIAGNOSIS — N3281 Overactive bladder: Secondary | ICD-10-CM | POA: Diagnosis not present

## 2023-06-02 DIAGNOSIS — R413 Other amnesia: Secondary | ICD-10-CM | POA: Diagnosis not present

## 2023-06-20 DIAGNOSIS — I10 Essential (primary) hypertension: Secondary | ICD-10-CM | POA: Diagnosis not present

## 2023-06-20 DIAGNOSIS — R413 Other amnesia: Secondary | ICD-10-CM | POA: Diagnosis not present

## 2023-06-20 DIAGNOSIS — M21371 Foot drop, right foot: Secondary | ICD-10-CM | POA: Diagnosis not present

## 2023-06-20 DIAGNOSIS — E1169 Type 2 diabetes mellitus with other specified complication: Secondary | ICD-10-CM | POA: Diagnosis not present

## 2023-06-20 DIAGNOSIS — E785 Hyperlipidemia, unspecified: Secondary | ICD-10-CM | POA: Diagnosis not present

## 2023-06-20 DIAGNOSIS — N3281 Overactive bladder: Secondary | ICD-10-CM | POA: Diagnosis not present

## 2023-06-20 DIAGNOSIS — R32 Unspecified urinary incontinence: Secondary | ICD-10-CM | POA: Diagnosis not present

## 2023-06-20 DIAGNOSIS — Z7984 Long term (current) use of oral hypoglycemic drugs: Secondary | ICD-10-CM | POA: Diagnosis not present

## 2023-06-20 DIAGNOSIS — Z9181 History of falling: Secondary | ICD-10-CM | POA: Diagnosis not present

## 2023-06-21 DIAGNOSIS — N3281 Overactive bladder: Secondary | ICD-10-CM | POA: Diagnosis not present

## 2023-06-21 DIAGNOSIS — E785 Hyperlipidemia, unspecified: Secondary | ICD-10-CM | POA: Diagnosis not present

## 2023-06-21 DIAGNOSIS — Z7984 Long term (current) use of oral hypoglycemic drugs: Secondary | ICD-10-CM | POA: Diagnosis not present

## 2023-06-21 DIAGNOSIS — Z7985 Long-term (current) use of injectable non-insulin antidiabetic drugs: Secondary | ICD-10-CM | POA: Diagnosis not present

## 2023-06-21 DIAGNOSIS — I10 Essential (primary) hypertension: Secondary | ICD-10-CM | POA: Diagnosis not present

## 2023-06-21 DIAGNOSIS — E1169 Type 2 diabetes mellitus with other specified complication: Secondary | ICD-10-CM | POA: Diagnosis not present

## 2023-06-21 DIAGNOSIS — M21371 Foot drop, right foot: Secondary | ICD-10-CM | POA: Diagnosis not present

## 2023-06-24 DIAGNOSIS — N3281 Overactive bladder: Secondary | ICD-10-CM | POA: Diagnosis not present

## 2023-06-24 DIAGNOSIS — M21371 Foot drop, right foot: Secondary | ICD-10-CM | POA: Diagnosis not present

## 2023-06-24 DIAGNOSIS — Z7984 Long term (current) use of oral hypoglycemic drugs: Secondary | ICD-10-CM | POA: Diagnosis not present

## 2023-06-24 DIAGNOSIS — I10 Essential (primary) hypertension: Secondary | ICD-10-CM | POA: Diagnosis not present

## 2023-06-24 DIAGNOSIS — E1169 Type 2 diabetes mellitus with other specified complication: Secondary | ICD-10-CM | POA: Diagnosis not present

## 2023-06-24 DIAGNOSIS — Z7985 Long-term (current) use of injectable non-insulin antidiabetic drugs: Secondary | ICD-10-CM | POA: Diagnosis not present

## 2023-06-24 DIAGNOSIS — E785 Hyperlipidemia, unspecified: Secondary | ICD-10-CM | POA: Diagnosis not present

## 2023-07-01 DIAGNOSIS — Z7985 Long-term (current) use of injectable non-insulin antidiabetic drugs: Secondary | ICD-10-CM | POA: Diagnosis not present

## 2023-07-01 DIAGNOSIS — E1169 Type 2 diabetes mellitus with other specified complication: Secondary | ICD-10-CM | POA: Diagnosis not present

## 2023-07-01 DIAGNOSIS — N3281 Overactive bladder: Secondary | ICD-10-CM | POA: Diagnosis not present

## 2023-07-01 DIAGNOSIS — I10 Essential (primary) hypertension: Secondary | ICD-10-CM | POA: Diagnosis not present

## 2023-07-01 DIAGNOSIS — E785 Hyperlipidemia, unspecified: Secondary | ICD-10-CM | POA: Diagnosis not present

## 2023-07-01 DIAGNOSIS — M21371 Foot drop, right foot: Secondary | ICD-10-CM | POA: Diagnosis not present

## 2023-07-01 DIAGNOSIS — Z7984 Long term (current) use of oral hypoglycemic drugs: Secondary | ICD-10-CM | POA: Diagnosis not present

## 2023-07-07 DIAGNOSIS — Z7984 Long term (current) use of oral hypoglycemic drugs: Secondary | ICD-10-CM | POA: Diagnosis not present

## 2023-07-07 DIAGNOSIS — E785 Hyperlipidemia, unspecified: Secondary | ICD-10-CM | POA: Diagnosis not present

## 2023-07-07 DIAGNOSIS — Z7985 Long-term (current) use of injectable non-insulin antidiabetic drugs: Secondary | ICD-10-CM | POA: Diagnosis not present

## 2023-07-07 DIAGNOSIS — I10 Essential (primary) hypertension: Secondary | ICD-10-CM | POA: Diagnosis not present

## 2023-07-07 DIAGNOSIS — N3281 Overactive bladder: Secondary | ICD-10-CM | POA: Diagnosis not present

## 2023-07-07 DIAGNOSIS — M21371 Foot drop, right foot: Secondary | ICD-10-CM | POA: Diagnosis not present

## 2023-07-07 DIAGNOSIS — E1169 Type 2 diabetes mellitus with other specified complication: Secondary | ICD-10-CM | POA: Diagnosis not present

## 2023-07-11 DIAGNOSIS — E785 Hyperlipidemia, unspecified: Secondary | ICD-10-CM | POA: Diagnosis not present

## 2023-07-11 DIAGNOSIS — R600 Localized edema: Secondary | ICD-10-CM | POA: Diagnosis not present

## 2023-07-11 DIAGNOSIS — Z008 Encounter for other general examination: Secondary | ICD-10-CM | POA: Diagnosis not present

## 2023-07-11 DIAGNOSIS — Z972 Presence of dental prosthetic device (complete) (partial): Secondary | ICD-10-CM | POA: Diagnosis not present

## 2023-07-11 DIAGNOSIS — M1990 Unspecified osteoarthritis, unspecified site: Secondary | ICD-10-CM | POA: Diagnosis not present

## 2023-07-11 DIAGNOSIS — Z833 Family history of diabetes mellitus: Secondary | ICD-10-CM | POA: Diagnosis not present

## 2023-07-11 DIAGNOSIS — R32 Unspecified urinary incontinence: Secondary | ICD-10-CM | POA: Diagnosis not present

## 2023-07-13 DIAGNOSIS — I10 Essential (primary) hypertension: Secondary | ICD-10-CM | POA: Diagnosis not present

## 2023-07-13 DIAGNOSIS — E785 Hyperlipidemia, unspecified: Secondary | ICD-10-CM | POA: Diagnosis not present

## 2023-07-13 DIAGNOSIS — Z7984 Long term (current) use of oral hypoglycemic drugs: Secondary | ICD-10-CM | POA: Diagnosis not present

## 2023-07-13 DIAGNOSIS — Z7985 Long-term (current) use of injectable non-insulin antidiabetic drugs: Secondary | ICD-10-CM | POA: Diagnosis not present

## 2023-07-13 DIAGNOSIS — M21371 Foot drop, right foot: Secondary | ICD-10-CM | POA: Diagnosis not present

## 2023-07-13 DIAGNOSIS — N3281 Overactive bladder: Secondary | ICD-10-CM | POA: Diagnosis not present

## 2023-07-13 DIAGNOSIS — E1169 Type 2 diabetes mellitus with other specified complication: Secondary | ICD-10-CM | POA: Diagnosis not present

## 2023-07-21 DIAGNOSIS — Z7985 Long-term (current) use of injectable non-insulin antidiabetic drugs: Secondary | ICD-10-CM | POA: Diagnosis not present

## 2023-07-21 DIAGNOSIS — I10 Essential (primary) hypertension: Secondary | ICD-10-CM | POA: Diagnosis not present

## 2023-07-21 DIAGNOSIS — E785 Hyperlipidemia, unspecified: Secondary | ICD-10-CM | POA: Diagnosis not present

## 2023-07-21 DIAGNOSIS — Z7984 Long term (current) use of oral hypoglycemic drugs: Secondary | ICD-10-CM | POA: Diagnosis not present

## 2023-07-21 DIAGNOSIS — E1169 Type 2 diabetes mellitus with other specified complication: Secondary | ICD-10-CM | POA: Diagnosis not present

## 2023-07-21 DIAGNOSIS — M21371 Foot drop, right foot: Secondary | ICD-10-CM | POA: Diagnosis not present

## 2023-07-21 DIAGNOSIS — N3281 Overactive bladder: Secondary | ICD-10-CM | POA: Diagnosis not present

## 2023-07-22 DIAGNOSIS — Z7985 Long-term (current) use of injectable non-insulin antidiabetic drugs: Secondary | ICD-10-CM | POA: Diagnosis not present

## 2023-07-22 DIAGNOSIS — E1169 Type 2 diabetes mellitus with other specified complication: Secondary | ICD-10-CM | POA: Diagnosis not present

## 2023-07-22 DIAGNOSIS — Z7984 Long term (current) use of oral hypoglycemic drugs: Secondary | ICD-10-CM | POA: Diagnosis not present

## 2023-07-22 DIAGNOSIS — M21371 Foot drop, right foot: Secondary | ICD-10-CM | POA: Diagnosis not present

## 2023-07-22 DIAGNOSIS — E785 Hyperlipidemia, unspecified: Secondary | ICD-10-CM | POA: Diagnosis not present

## 2023-07-22 DIAGNOSIS — I10 Essential (primary) hypertension: Secondary | ICD-10-CM | POA: Diagnosis not present

## 2023-07-22 DIAGNOSIS — N3281 Overactive bladder: Secondary | ICD-10-CM | POA: Diagnosis not present

## 2023-07-28 DIAGNOSIS — Z7984 Long term (current) use of oral hypoglycemic drugs: Secondary | ICD-10-CM | POA: Diagnosis not present

## 2023-07-28 DIAGNOSIS — M21371 Foot drop, right foot: Secondary | ICD-10-CM | POA: Diagnosis not present

## 2023-07-28 DIAGNOSIS — Z7985 Long-term (current) use of injectable non-insulin antidiabetic drugs: Secondary | ICD-10-CM | POA: Diagnosis not present

## 2023-07-28 DIAGNOSIS — N3281 Overactive bladder: Secondary | ICD-10-CM | POA: Diagnosis not present

## 2023-07-28 DIAGNOSIS — E1169 Type 2 diabetes mellitus with other specified complication: Secondary | ICD-10-CM | POA: Diagnosis not present

## 2023-07-28 DIAGNOSIS — I10 Essential (primary) hypertension: Secondary | ICD-10-CM | POA: Diagnosis not present

## 2023-07-28 DIAGNOSIS — E785 Hyperlipidemia, unspecified: Secondary | ICD-10-CM | POA: Diagnosis not present

## 2023-08-04 DIAGNOSIS — I10 Essential (primary) hypertension: Secondary | ICD-10-CM | POA: Diagnosis not present

## 2023-08-04 DIAGNOSIS — M21371 Foot drop, right foot: Secondary | ICD-10-CM | POA: Diagnosis not present

## 2023-08-04 DIAGNOSIS — E785 Hyperlipidemia, unspecified: Secondary | ICD-10-CM | POA: Diagnosis not present

## 2023-08-04 DIAGNOSIS — E1169 Type 2 diabetes mellitus with other specified complication: Secondary | ICD-10-CM | POA: Diagnosis not present

## 2023-08-04 DIAGNOSIS — Z7985 Long-term (current) use of injectable non-insulin antidiabetic drugs: Secondary | ICD-10-CM | POA: Diagnosis not present

## 2023-08-04 DIAGNOSIS — N3281 Overactive bladder: Secondary | ICD-10-CM | POA: Diagnosis not present

## 2023-08-04 DIAGNOSIS — Z7984 Long term (current) use of oral hypoglycemic drugs: Secondary | ICD-10-CM | POA: Diagnosis not present

## 2023-08-11 DIAGNOSIS — M21371 Foot drop, right foot: Secondary | ICD-10-CM | POA: Diagnosis not present

## 2023-08-11 DIAGNOSIS — N3281 Overactive bladder: Secondary | ICD-10-CM | POA: Diagnosis not present

## 2023-08-11 DIAGNOSIS — Z7984 Long term (current) use of oral hypoglycemic drugs: Secondary | ICD-10-CM | POA: Diagnosis not present

## 2023-08-11 DIAGNOSIS — I10 Essential (primary) hypertension: Secondary | ICD-10-CM | POA: Diagnosis not present

## 2023-08-11 DIAGNOSIS — E1169 Type 2 diabetes mellitus with other specified complication: Secondary | ICD-10-CM | POA: Diagnosis not present

## 2023-08-11 DIAGNOSIS — E785 Hyperlipidemia, unspecified: Secondary | ICD-10-CM | POA: Diagnosis not present

## 2023-08-11 DIAGNOSIS — Z7985 Long-term (current) use of injectable non-insulin antidiabetic drugs: Secondary | ICD-10-CM | POA: Diagnosis not present

## 2023-08-18 DIAGNOSIS — Z7984 Long term (current) use of oral hypoglycemic drugs: Secondary | ICD-10-CM | POA: Diagnosis not present

## 2023-08-18 DIAGNOSIS — Z7985 Long-term (current) use of injectable non-insulin antidiabetic drugs: Secondary | ICD-10-CM | POA: Diagnosis not present

## 2023-08-18 DIAGNOSIS — I10 Essential (primary) hypertension: Secondary | ICD-10-CM | POA: Diagnosis not present

## 2023-08-18 DIAGNOSIS — N3281 Overactive bladder: Secondary | ICD-10-CM | POA: Diagnosis not present

## 2023-08-18 DIAGNOSIS — M21371 Foot drop, right foot: Secondary | ICD-10-CM | POA: Diagnosis not present

## 2023-08-18 DIAGNOSIS — E785 Hyperlipidemia, unspecified: Secondary | ICD-10-CM | POA: Diagnosis not present

## 2023-08-18 DIAGNOSIS — E1169 Type 2 diabetes mellitus with other specified complication: Secondary | ICD-10-CM | POA: Diagnosis not present

## 2023-12-22 DIAGNOSIS — G910 Communicating hydrocephalus: Secondary | ICD-10-CM | POA: Diagnosis not present

## 2023-12-22 DIAGNOSIS — R413 Other amnesia: Secondary | ICD-10-CM | POA: Diagnosis not present

## 2023-12-30 DIAGNOSIS — Z7984 Long term (current) use of oral hypoglycemic drugs: Secondary | ICD-10-CM | POA: Diagnosis not present

## 2023-12-30 DIAGNOSIS — R7989 Other specified abnormal findings of blood chemistry: Secondary | ICD-10-CM | POA: Diagnosis not present

## 2023-12-30 DIAGNOSIS — E119 Type 2 diabetes mellitus without complications: Secondary | ICD-10-CM | POA: Diagnosis not present

## 2023-12-30 DIAGNOSIS — R413 Other amnesia: Secondary | ICD-10-CM | POA: Diagnosis not present

## 2023-12-30 DIAGNOSIS — L03115 Cellulitis of right lower limb: Secondary | ICD-10-CM | POA: Diagnosis not present

## 2023-12-30 DIAGNOSIS — G910 Communicating hydrocephalus: Secondary | ICD-10-CM | POA: Diagnosis not present

## 2023-12-30 DIAGNOSIS — R600 Localized edema: Secondary | ICD-10-CM | POA: Diagnosis not present

## 2023-12-30 DIAGNOSIS — D72829 Elevated white blood cell count, unspecified: Secondary | ICD-10-CM | POA: Diagnosis not present

## 2023-12-30 DIAGNOSIS — R197 Diarrhea, unspecified: Secondary | ICD-10-CM | POA: Diagnosis not present

## 2023-12-30 DIAGNOSIS — R296 Repeated falls: Secondary | ICD-10-CM | POA: Diagnosis not present

## 2023-12-30 DIAGNOSIS — M25521 Pain in right elbow: Secondary | ICD-10-CM | POA: Diagnosis not present

## 2023-12-30 DIAGNOSIS — G912 (Idiopathic) normal pressure hydrocephalus: Secondary | ICD-10-CM | POA: Diagnosis not present

## 2023-12-30 DIAGNOSIS — R4189 Other symptoms and signs involving cognitive functions and awareness: Secondary | ICD-10-CM | POA: Diagnosis not present

## 2023-12-30 DIAGNOSIS — I491 Atrial premature depolarization: Secondary | ICD-10-CM | POA: Diagnosis not present

## 2023-12-30 DIAGNOSIS — Z8679 Personal history of other diseases of the circulatory system: Secondary | ICD-10-CM | POA: Diagnosis not present

## 2023-12-30 DIAGNOSIS — G919 Hydrocephalus, unspecified: Secondary | ICD-10-CM | POA: Diagnosis not present

## 2023-12-30 DIAGNOSIS — E871 Hypo-osmolality and hyponatremia: Secondary | ICD-10-CM | POA: Diagnosis not present

## 2023-12-30 DIAGNOSIS — R32 Unspecified urinary incontinence: Secondary | ICD-10-CM | POA: Diagnosis not present

## 2023-12-31 DIAGNOSIS — G912 (Idiopathic) normal pressure hydrocephalus: Secondary | ICD-10-CM | POA: Diagnosis not present

## 2024-01-01 DIAGNOSIS — R413 Other amnesia: Secondary | ICD-10-CM | POA: Diagnosis not present

## 2024-01-01 DIAGNOSIS — D72829 Elevated white blood cell count, unspecified: Secondary | ICD-10-CM | POA: Diagnosis not present

## 2024-01-01 DIAGNOSIS — R4189 Other symptoms and signs involving cognitive functions and awareness: Secondary | ICD-10-CM | POA: Diagnosis not present

## 2024-01-01 DIAGNOSIS — E1165 Type 2 diabetes mellitus with hyperglycemia: Secondary | ICD-10-CM | POA: Diagnosis not present

## 2024-01-01 DIAGNOSIS — Z7984 Long term (current) use of oral hypoglycemic drugs: Secondary | ICD-10-CM | POA: Diagnosis not present

## 2024-01-01 DIAGNOSIS — I739 Peripheral vascular disease, unspecified: Secondary | ICD-10-CM | POA: Diagnosis not present

## 2024-01-01 DIAGNOSIS — R32 Unspecified urinary incontinence: Secondary | ICD-10-CM | POA: Diagnosis not present

## 2024-01-01 DIAGNOSIS — R296 Repeated falls: Secondary | ICD-10-CM | POA: Diagnosis not present

## 2024-01-01 DIAGNOSIS — R197 Diarrhea, unspecified: Secondary | ICD-10-CM | POA: Diagnosis not present

## 2024-01-01 DIAGNOSIS — E119 Type 2 diabetes mellitus without complications: Secondary | ICD-10-CM | POA: Diagnosis not present

## 2024-01-01 DIAGNOSIS — E871 Hypo-osmolality and hyponatremia: Secondary | ICD-10-CM | POA: Diagnosis not present

## 2024-01-01 DIAGNOSIS — M25521 Pain in right elbow: Secondary | ICD-10-CM | POA: Diagnosis not present

## 2024-01-01 DIAGNOSIS — G912 (Idiopathic) normal pressure hydrocephalus: Secondary | ICD-10-CM | POA: Diagnosis not present

## 2024-01-01 DIAGNOSIS — R7989 Other specified abnormal findings of blood chemistry: Secondary | ICD-10-CM | POA: Diagnosis not present

## 2024-01-01 DIAGNOSIS — E785 Hyperlipidemia, unspecified: Secondary | ICD-10-CM | POA: Diagnosis not present

## 2024-01-01 DIAGNOSIS — L03115 Cellulitis of right lower limb: Secondary | ICD-10-CM | POA: Diagnosis not present

## 2024-01-01 DIAGNOSIS — Z8679 Personal history of other diseases of the circulatory system: Secondary | ICD-10-CM | POA: Diagnosis not present

## 2024-01-01 DIAGNOSIS — I10 Essential (primary) hypertension: Secondary | ICD-10-CM | POA: Diagnosis not present

## 2024-01-01 DIAGNOSIS — G910 Communicating hydrocephalus: Secondary | ICD-10-CM | POA: Diagnosis not present

## 2024-01-01 DIAGNOSIS — R600 Localized edema: Secondary | ICD-10-CM | POA: Diagnosis not present

## 2024-01-19 DIAGNOSIS — L03115 Cellulitis of right lower limb: Secondary | ICD-10-CM | POA: Diagnosis not present

## 2024-01-19 DIAGNOSIS — D72829 Elevated white blood cell count, unspecified: Secondary | ICD-10-CM | POA: Diagnosis not present

## 2024-01-21 DIAGNOSIS — E785 Hyperlipidemia, unspecified: Secondary | ICD-10-CM | POA: Diagnosis not present

## 2024-01-21 DIAGNOSIS — L03115 Cellulitis of right lower limb: Secondary | ICD-10-CM | POA: Diagnosis not present

## 2024-01-21 DIAGNOSIS — I739 Peripheral vascular disease, unspecified: Secondary | ICD-10-CM | POA: Diagnosis not present

## 2024-01-21 DIAGNOSIS — E1165 Type 2 diabetes mellitus with hyperglycemia: Secondary | ICD-10-CM | POA: Diagnosis not present

## 2024-01-21 DIAGNOSIS — G912 (Idiopathic) normal pressure hydrocephalus: Secondary | ICD-10-CM | POA: Diagnosis not present

## 2024-01-21 DIAGNOSIS — D72829 Elevated white blood cell count, unspecified: Secondary | ICD-10-CM | POA: Diagnosis not present

## 2024-01-21 DIAGNOSIS — I10 Essential (primary) hypertension: Secondary | ICD-10-CM | POA: Diagnosis not present

## 2024-01-22 DIAGNOSIS — L03115 Cellulitis of right lower limb: Secondary | ICD-10-CM | POA: Diagnosis not present

## 2024-01-22 DIAGNOSIS — I10 Essential (primary) hypertension: Secondary | ICD-10-CM | POA: Diagnosis not present

## 2024-01-22 DIAGNOSIS — D72829 Elevated white blood cell count, unspecified: Secondary | ICD-10-CM | POA: Diagnosis not present

## 2024-01-22 DIAGNOSIS — I739 Peripheral vascular disease, unspecified: Secondary | ICD-10-CM | POA: Diagnosis not present

## 2024-01-22 DIAGNOSIS — G912 (Idiopathic) normal pressure hydrocephalus: Secondary | ICD-10-CM | POA: Diagnosis not present

## 2024-01-22 DIAGNOSIS — E1165 Type 2 diabetes mellitus with hyperglycemia: Secondary | ICD-10-CM | POA: Diagnosis not present

## 2024-01-22 DIAGNOSIS — E785 Hyperlipidemia, unspecified: Secondary | ICD-10-CM | POA: Diagnosis not present

## 2024-01-25 DIAGNOSIS — D72829 Elevated white blood cell count, unspecified: Secondary | ICD-10-CM | POA: Diagnosis not present

## 2024-01-25 DIAGNOSIS — I739 Peripheral vascular disease, unspecified: Secondary | ICD-10-CM | POA: Diagnosis not present

## 2024-01-25 DIAGNOSIS — L03115 Cellulitis of right lower limb: Secondary | ICD-10-CM | POA: Diagnosis not present

## 2024-01-25 DIAGNOSIS — E785 Hyperlipidemia, unspecified: Secondary | ICD-10-CM | POA: Diagnosis not present

## 2024-01-25 DIAGNOSIS — I10 Essential (primary) hypertension: Secondary | ICD-10-CM | POA: Diagnosis not present

## 2024-01-25 DIAGNOSIS — E1165 Type 2 diabetes mellitus with hyperglycemia: Secondary | ICD-10-CM | POA: Diagnosis not present

## 2024-01-25 DIAGNOSIS — G912 (Idiopathic) normal pressure hydrocephalus: Secondary | ICD-10-CM | POA: Diagnosis not present

## 2024-01-26 DIAGNOSIS — I10 Essential (primary) hypertension: Secondary | ICD-10-CM | POA: Diagnosis not present

## 2024-01-26 DIAGNOSIS — I739 Peripheral vascular disease, unspecified: Secondary | ICD-10-CM | POA: Diagnosis not present

## 2024-01-26 DIAGNOSIS — L03115 Cellulitis of right lower limb: Secondary | ICD-10-CM | POA: Diagnosis not present

## 2024-01-26 DIAGNOSIS — E785 Hyperlipidemia, unspecified: Secondary | ICD-10-CM | POA: Diagnosis not present

## 2024-01-26 DIAGNOSIS — D72829 Elevated white blood cell count, unspecified: Secondary | ICD-10-CM | POA: Diagnosis not present

## 2024-01-26 DIAGNOSIS — E1165 Type 2 diabetes mellitus with hyperglycemia: Secondary | ICD-10-CM | POA: Diagnosis not present

## 2024-01-26 DIAGNOSIS — G912 (Idiopathic) normal pressure hydrocephalus: Secondary | ICD-10-CM | POA: Diagnosis not present

## 2024-01-27 DIAGNOSIS — E1165 Type 2 diabetes mellitus with hyperglycemia: Secondary | ICD-10-CM | POA: Diagnosis not present

## 2024-01-27 DIAGNOSIS — I10 Essential (primary) hypertension: Secondary | ICD-10-CM | POA: Diagnosis not present

## 2024-01-27 DIAGNOSIS — E785 Hyperlipidemia, unspecified: Secondary | ICD-10-CM | POA: Diagnosis not present

## 2024-01-27 DIAGNOSIS — I739 Peripheral vascular disease, unspecified: Secondary | ICD-10-CM | POA: Diagnosis not present

## 2024-01-27 DIAGNOSIS — D72829 Elevated white blood cell count, unspecified: Secondary | ICD-10-CM | POA: Diagnosis not present

## 2024-01-27 DIAGNOSIS — L03115 Cellulitis of right lower limb: Secondary | ICD-10-CM | POA: Diagnosis not present

## 2024-01-27 DIAGNOSIS — G912 (Idiopathic) normal pressure hydrocephalus: Secondary | ICD-10-CM | POA: Diagnosis not present

## 2024-01-28 DIAGNOSIS — I10 Essential (primary) hypertension: Secondary | ICD-10-CM | POA: Diagnosis not present

## 2024-01-28 DIAGNOSIS — E1165 Type 2 diabetes mellitus with hyperglycemia: Secondary | ICD-10-CM | POA: Diagnosis not present

## 2024-01-28 DIAGNOSIS — L03115 Cellulitis of right lower limb: Secondary | ICD-10-CM | POA: Diagnosis not present

## 2024-01-28 DIAGNOSIS — G912 (Idiopathic) normal pressure hydrocephalus: Secondary | ICD-10-CM | POA: Diagnosis not present

## 2024-01-28 DIAGNOSIS — I739 Peripheral vascular disease, unspecified: Secondary | ICD-10-CM | POA: Diagnosis not present

## 2024-01-28 DIAGNOSIS — E785 Hyperlipidemia, unspecified: Secondary | ICD-10-CM | POA: Diagnosis not present

## 2024-01-28 DIAGNOSIS — D72829 Elevated white blood cell count, unspecified: Secondary | ICD-10-CM | POA: Diagnosis not present

## 2024-01-29 DIAGNOSIS — G912 (Idiopathic) normal pressure hydrocephalus: Secondary | ICD-10-CM | POA: Diagnosis not present

## 2024-01-29 DIAGNOSIS — E1165 Type 2 diabetes mellitus with hyperglycemia: Secondary | ICD-10-CM | POA: Diagnosis not present

## 2024-01-29 DIAGNOSIS — E785 Hyperlipidemia, unspecified: Secondary | ICD-10-CM | POA: Diagnosis not present

## 2024-01-29 DIAGNOSIS — I739 Peripheral vascular disease, unspecified: Secondary | ICD-10-CM | POA: Diagnosis not present

## 2024-01-29 DIAGNOSIS — L03115 Cellulitis of right lower limb: Secondary | ICD-10-CM | POA: Diagnosis not present

## 2024-01-29 DIAGNOSIS — D72829 Elevated white blood cell count, unspecified: Secondary | ICD-10-CM | POA: Diagnosis not present

## 2024-01-29 DIAGNOSIS — I10 Essential (primary) hypertension: Secondary | ICD-10-CM | POA: Diagnosis not present

## 2024-01-30 DIAGNOSIS — D72829 Elevated white blood cell count, unspecified: Secondary | ICD-10-CM | POA: Diagnosis not present

## 2024-01-30 DIAGNOSIS — I739 Peripheral vascular disease, unspecified: Secondary | ICD-10-CM | POA: Diagnosis not present

## 2024-01-30 DIAGNOSIS — I10 Essential (primary) hypertension: Secondary | ICD-10-CM | POA: Diagnosis not present

## 2024-01-30 DIAGNOSIS — L03115 Cellulitis of right lower limb: Secondary | ICD-10-CM | POA: Diagnosis not present

## 2024-01-30 DIAGNOSIS — E1165 Type 2 diabetes mellitus with hyperglycemia: Secondary | ICD-10-CM | POA: Diagnosis not present

## 2024-01-30 DIAGNOSIS — E785 Hyperlipidemia, unspecified: Secondary | ICD-10-CM | POA: Diagnosis not present

## 2024-01-30 DIAGNOSIS — G912 (Idiopathic) normal pressure hydrocephalus: Secondary | ICD-10-CM | POA: Diagnosis not present

## 2024-01-31 DIAGNOSIS — I10 Essential (primary) hypertension: Secondary | ICD-10-CM | POA: Diagnosis not present

## 2024-01-31 DIAGNOSIS — D72829 Elevated white blood cell count, unspecified: Secondary | ICD-10-CM | POA: Diagnosis not present

## 2024-01-31 DIAGNOSIS — I739 Peripheral vascular disease, unspecified: Secondary | ICD-10-CM | POA: Diagnosis not present

## 2024-01-31 DIAGNOSIS — E785 Hyperlipidemia, unspecified: Secondary | ICD-10-CM | POA: Diagnosis not present

## 2024-01-31 DIAGNOSIS — L03115 Cellulitis of right lower limb: Secondary | ICD-10-CM | POA: Diagnosis not present

## 2024-01-31 DIAGNOSIS — E1165 Type 2 diabetes mellitus with hyperglycemia: Secondary | ICD-10-CM | POA: Diagnosis not present

## 2024-01-31 DIAGNOSIS — G912 (Idiopathic) normal pressure hydrocephalus: Secondary | ICD-10-CM | POA: Diagnosis not present

## 2024-02-01 DIAGNOSIS — D72829 Elevated white blood cell count, unspecified: Secondary | ICD-10-CM | POA: Diagnosis not present

## 2024-02-01 DIAGNOSIS — G912 (Idiopathic) normal pressure hydrocephalus: Secondary | ICD-10-CM | POA: Diagnosis not present

## 2024-02-01 DIAGNOSIS — E1165 Type 2 diabetes mellitus with hyperglycemia: Secondary | ICD-10-CM | POA: Diagnosis not present

## 2024-02-01 DIAGNOSIS — I739 Peripheral vascular disease, unspecified: Secondary | ICD-10-CM | POA: Diagnosis not present

## 2024-02-01 DIAGNOSIS — E785 Hyperlipidemia, unspecified: Secondary | ICD-10-CM | POA: Diagnosis not present

## 2024-02-01 DIAGNOSIS — L03115 Cellulitis of right lower limb: Secondary | ICD-10-CM | POA: Diagnosis not present

## 2024-02-01 DIAGNOSIS — I10 Essential (primary) hypertension: Secondary | ICD-10-CM | POA: Diagnosis not present

## 2024-02-02 DIAGNOSIS — D72829 Elevated white blood cell count, unspecified: Secondary | ICD-10-CM | POA: Diagnosis not present

## 2024-02-02 DIAGNOSIS — I10 Essential (primary) hypertension: Secondary | ICD-10-CM | POA: Diagnosis not present

## 2024-02-02 DIAGNOSIS — L03115 Cellulitis of right lower limb: Secondary | ICD-10-CM | POA: Diagnosis not present

## 2024-02-02 DIAGNOSIS — G912 (Idiopathic) normal pressure hydrocephalus: Secondary | ICD-10-CM | POA: Diagnosis not present

## 2024-02-02 DIAGNOSIS — E1165 Type 2 diabetes mellitus with hyperglycemia: Secondary | ICD-10-CM | POA: Diagnosis not present

## 2024-02-02 DIAGNOSIS — I739 Peripheral vascular disease, unspecified: Secondary | ICD-10-CM | POA: Diagnosis not present

## 2024-02-02 DIAGNOSIS — E785 Hyperlipidemia, unspecified: Secondary | ICD-10-CM | POA: Diagnosis not present

## 2024-02-03 DIAGNOSIS — L03115 Cellulitis of right lower limb: Secondary | ICD-10-CM | POA: Diagnosis not present

## 2024-02-03 DIAGNOSIS — I10 Essential (primary) hypertension: Secondary | ICD-10-CM | POA: Diagnosis not present

## 2024-02-03 DIAGNOSIS — G912 (Idiopathic) normal pressure hydrocephalus: Secondary | ICD-10-CM | POA: Diagnosis not present

## 2024-02-03 DIAGNOSIS — D72829 Elevated white blood cell count, unspecified: Secondary | ICD-10-CM | POA: Diagnosis not present

## 2024-02-03 DIAGNOSIS — E1165 Type 2 diabetes mellitus with hyperglycemia: Secondary | ICD-10-CM | POA: Diagnosis not present

## 2024-02-03 DIAGNOSIS — I739 Peripheral vascular disease, unspecified: Secondary | ICD-10-CM | POA: Diagnosis not present

## 2024-02-03 DIAGNOSIS — E785 Hyperlipidemia, unspecified: Secondary | ICD-10-CM | POA: Diagnosis not present

## 2024-02-05 DIAGNOSIS — E785 Hyperlipidemia, unspecified: Secondary | ICD-10-CM | POA: Diagnosis not present

## 2024-02-05 DIAGNOSIS — L03115 Cellulitis of right lower limb: Secondary | ICD-10-CM | POA: Diagnosis not present

## 2024-02-05 DIAGNOSIS — E1165 Type 2 diabetes mellitus with hyperglycemia: Secondary | ICD-10-CM | POA: Diagnosis not present

## 2024-02-05 DIAGNOSIS — G912 (Idiopathic) normal pressure hydrocephalus: Secondary | ICD-10-CM | POA: Diagnosis not present

## 2024-02-05 DIAGNOSIS — D72829 Elevated white blood cell count, unspecified: Secondary | ICD-10-CM | POA: Diagnosis not present

## 2024-02-05 DIAGNOSIS — I739 Peripheral vascular disease, unspecified: Secondary | ICD-10-CM | POA: Diagnosis not present

## 2024-02-05 DIAGNOSIS — I10 Essential (primary) hypertension: Secondary | ICD-10-CM | POA: Diagnosis not present

## 2024-02-06 DIAGNOSIS — I10 Essential (primary) hypertension: Secondary | ICD-10-CM | POA: Diagnosis not present

## 2024-02-06 DIAGNOSIS — E1165 Type 2 diabetes mellitus with hyperglycemia: Secondary | ICD-10-CM | POA: Diagnosis not present

## 2024-02-06 DIAGNOSIS — D72829 Elevated white blood cell count, unspecified: Secondary | ICD-10-CM | POA: Diagnosis not present

## 2024-02-06 DIAGNOSIS — E785 Hyperlipidemia, unspecified: Secondary | ICD-10-CM | POA: Diagnosis not present

## 2024-02-06 DIAGNOSIS — I739 Peripheral vascular disease, unspecified: Secondary | ICD-10-CM | POA: Diagnosis not present

## 2024-02-06 DIAGNOSIS — G912 (Idiopathic) normal pressure hydrocephalus: Secondary | ICD-10-CM | POA: Diagnosis not present

## 2024-02-06 DIAGNOSIS — L03115 Cellulitis of right lower limb: Secondary | ICD-10-CM | POA: Diagnosis not present

## 2024-02-08 DIAGNOSIS — I10 Essential (primary) hypertension: Secondary | ICD-10-CM | POA: Diagnosis not present

## 2024-02-08 DIAGNOSIS — D72829 Elevated white blood cell count, unspecified: Secondary | ICD-10-CM | POA: Diagnosis not present

## 2024-02-08 DIAGNOSIS — E1165 Type 2 diabetes mellitus with hyperglycemia: Secondary | ICD-10-CM | POA: Diagnosis not present

## 2024-02-08 DIAGNOSIS — L03115 Cellulitis of right lower limb: Secondary | ICD-10-CM | POA: Diagnosis not present

## 2024-02-08 DIAGNOSIS — I739 Peripheral vascular disease, unspecified: Secondary | ICD-10-CM | POA: Diagnosis not present

## 2024-02-08 DIAGNOSIS — E785 Hyperlipidemia, unspecified: Secondary | ICD-10-CM | POA: Diagnosis not present

## 2024-02-08 DIAGNOSIS — G912 (Idiopathic) normal pressure hydrocephalus: Secondary | ICD-10-CM | POA: Diagnosis not present

## 2024-02-09 DIAGNOSIS — L03115 Cellulitis of right lower limb: Secondary | ICD-10-CM | POA: Diagnosis not present

## 2024-02-09 DIAGNOSIS — E785 Hyperlipidemia, unspecified: Secondary | ICD-10-CM | POA: Diagnosis not present

## 2024-02-09 DIAGNOSIS — I10 Essential (primary) hypertension: Secondary | ICD-10-CM | POA: Diagnosis not present

## 2024-02-09 DIAGNOSIS — G912 (Idiopathic) normal pressure hydrocephalus: Secondary | ICD-10-CM | POA: Diagnosis not present

## 2024-02-09 DIAGNOSIS — I739 Peripheral vascular disease, unspecified: Secondary | ICD-10-CM | POA: Diagnosis not present

## 2024-02-09 DIAGNOSIS — D72829 Elevated white blood cell count, unspecified: Secondary | ICD-10-CM | POA: Diagnosis not present

## 2024-02-09 DIAGNOSIS — E1165 Type 2 diabetes mellitus with hyperglycemia: Secondary | ICD-10-CM | POA: Diagnosis not present

## 2024-02-10 DIAGNOSIS — E1165 Type 2 diabetes mellitus with hyperglycemia: Secondary | ICD-10-CM | POA: Diagnosis not present

## 2024-02-10 DIAGNOSIS — G912 (Idiopathic) normal pressure hydrocephalus: Secondary | ICD-10-CM | POA: Diagnosis not present

## 2024-02-10 DIAGNOSIS — I739 Peripheral vascular disease, unspecified: Secondary | ICD-10-CM | POA: Diagnosis not present

## 2024-02-10 DIAGNOSIS — E785 Hyperlipidemia, unspecified: Secondary | ICD-10-CM | POA: Diagnosis not present

## 2024-02-10 DIAGNOSIS — D72829 Elevated white blood cell count, unspecified: Secondary | ICD-10-CM | POA: Diagnosis not present

## 2024-02-10 DIAGNOSIS — L03115 Cellulitis of right lower limb: Secondary | ICD-10-CM | POA: Diagnosis not present

## 2024-02-10 DIAGNOSIS — I10 Essential (primary) hypertension: Secondary | ICD-10-CM | POA: Diagnosis not present

## 2024-02-11 DIAGNOSIS — L03115 Cellulitis of right lower limb: Secondary | ICD-10-CM | POA: Diagnosis not present

## 2024-02-11 DIAGNOSIS — I10 Essential (primary) hypertension: Secondary | ICD-10-CM | POA: Diagnosis not present

## 2024-02-11 DIAGNOSIS — D72829 Elevated white blood cell count, unspecified: Secondary | ICD-10-CM | POA: Diagnosis not present

## 2024-02-11 DIAGNOSIS — E1165 Type 2 diabetes mellitus with hyperglycemia: Secondary | ICD-10-CM | POA: Diagnosis not present

## 2024-02-11 DIAGNOSIS — E785 Hyperlipidemia, unspecified: Secondary | ICD-10-CM | POA: Diagnosis not present

## 2024-02-11 DIAGNOSIS — I739 Peripheral vascular disease, unspecified: Secondary | ICD-10-CM | POA: Diagnosis not present

## 2024-02-11 DIAGNOSIS — G912 (Idiopathic) normal pressure hydrocephalus: Secondary | ICD-10-CM | POA: Diagnosis not present

## 2024-02-12 DIAGNOSIS — G912 (Idiopathic) normal pressure hydrocephalus: Secondary | ICD-10-CM | POA: Diagnosis not present

## 2024-02-12 DIAGNOSIS — I10 Essential (primary) hypertension: Secondary | ICD-10-CM | POA: Diagnosis not present

## 2024-02-12 DIAGNOSIS — E785 Hyperlipidemia, unspecified: Secondary | ICD-10-CM | POA: Diagnosis not present

## 2024-02-12 DIAGNOSIS — E1165 Type 2 diabetes mellitus with hyperglycemia: Secondary | ICD-10-CM | POA: Diagnosis not present

## 2024-02-12 DIAGNOSIS — I739 Peripheral vascular disease, unspecified: Secondary | ICD-10-CM | POA: Diagnosis not present

## 2024-02-12 DIAGNOSIS — D72829 Elevated white blood cell count, unspecified: Secondary | ICD-10-CM | POA: Diagnosis not present

## 2024-02-12 DIAGNOSIS — L03115 Cellulitis of right lower limb: Secondary | ICD-10-CM | POA: Diagnosis not present

## 2024-02-13 DIAGNOSIS — I739 Peripheral vascular disease, unspecified: Secondary | ICD-10-CM | POA: Diagnosis not present

## 2024-02-13 DIAGNOSIS — G912 (Idiopathic) normal pressure hydrocephalus: Secondary | ICD-10-CM | POA: Diagnosis not present

## 2024-02-13 DIAGNOSIS — I10 Essential (primary) hypertension: Secondary | ICD-10-CM | POA: Diagnosis not present

## 2024-02-13 DIAGNOSIS — D72829 Elevated white blood cell count, unspecified: Secondary | ICD-10-CM | POA: Diagnosis not present

## 2024-02-13 DIAGNOSIS — L03115 Cellulitis of right lower limb: Secondary | ICD-10-CM | POA: Diagnosis not present

## 2024-02-13 DIAGNOSIS — E785 Hyperlipidemia, unspecified: Secondary | ICD-10-CM | POA: Diagnosis not present

## 2024-02-13 DIAGNOSIS — E1165 Type 2 diabetes mellitus with hyperglycemia: Secondary | ICD-10-CM | POA: Diagnosis not present

## 2024-02-16 DIAGNOSIS — L03115 Cellulitis of right lower limb: Secondary | ICD-10-CM | POA: Diagnosis not present

## 2024-02-16 DIAGNOSIS — G912 (Idiopathic) normal pressure hydrocephalus: Secondary | ICD-10-CM | POA: Diagnosis not present

## 2024-02-16 DIAGNOSIS — I10 Essential (primary) hypertension: Secondary | ICD-10-CM | POA: Diagnosis not present

## 2024-02-16 DIAGNOSIS — E785 Hyperlipidemia, unspecified: Secondary | ICD-10-CM | POA: Diagnosis not present

## 2024-02-16 DIAGNOSIS — D72829 Elevated white blood cell count, unspecified: Secondary | ICD-10-CM | POA: Diagnosis not present

## 2024-02-16 DIAGNOSIS — I739 Peripheral vascular disease, unspecified: Secondary | ICD-10-CM | POA: Diagnosis not present

## 2024-02-16 DIAGNOSIS — E1165 Type 2 diabetes mellitus with hyperglycemia: Secondary | ICD-10-CM | POA: Diagnosis not present

## 2024-02-17 DIAGNOSIS — E1165 Type 2 diabetes mellitus with hyperglycemia: Secondary | ICD-10-CM | POA: Diagnosis not present

## 2024-02-17 DIAGNOSIS — D72829 Elevated white blood cell count, unspecified: Secondary | ICD-10-CM | POA: Diagnosis not present

## 2024-02-17 DIAGNOSIS — I739 Peripheral vascular disease, unspecified: Secondary | ICD-10-CM | POA: Diagnosis not present

## 2024-02-17 DIAGNOSIS — G912 (Idiopathic) normal pressure hydrocephalus: Secondary | ICD-10-CM | POA: Diagnosis not present

## 2024-02-17 DIAGNOSIS — I10 Essential (primary) hypertension: Secondary | ICD-10-CM | POA: Diagnosis not present

## 2024-02-17 DIAGNOSIS — L03115 Cellulitis of right lower limb: Secondary | ICD-10-CM | POA: Diagnosis not present

## 2024-02-17 DIAGNOSIS — E785 Hyperlipidemia, unspecified: Secondary | ICD-10-CM | POA: Diagnosis not present

## 2024-02-18 DIAGNOSIS — G912 (Idiopathic) normal pressure hydrocephalus: Secondary | ICD-10-CM | POA: Diagnosis not present

## 2024-02-18 DIAGNOSIS — L03115 Cellulitis of right lower limb: Secondary | ICD-10-CM | POA: Diagnosis not present

## 2024-02-18 DIAGNOSIS — I739 Peripheral vascular disease, unspecified: Secondary | ICD-10-CM | POA: Diagnosis not present

## 2024-02-18 DIAGNOSIS — E1165 Type 2 diabetes mellitus with hyperglycemia: Secondary | ICD-10-CM | POA: Diagnosis not present

## 2024-02-18 DIAGNOSIS — D72829 Elevated white blood cell count, unspecified: Secondary | ICD-10-CM | POA: Diagnosis not present

## 2024-02-18 DIAGNOSIS — I10 Essential (primary) hypertension: Secondary | ICD-10-CM | POA: Diagnosis not present

## 2024-02-18 DIAGNOSIS — E785 Hyperlipidemia, unspecified: Secondary | ICD-10-CM | POA: Diagnosis not present

## 2024-02-19 DIAGNOSIS — L03115 Cellulitis of right lower limb: Secondary | ICD-10-CM | POA: Diagnosis not present

## 2024-02-19 DIAGNOSIS — E785 Hyperlipidemia, unspecified: Secondary | ICD-10-CM | POA: Diagnosis not present

## 2024-02-19 DIAGNOSIS — D72829 Elevated white blood cell count, unspecified: Secondary | ICD-10-CM | POA: Diagnosis not present

## 2024-02-19 DIAGNOSIS — I10 Essential (primary) hypertension: Secondary | ICD-10-CM | POA: Diagnosis not present

## 2024-02-19 DIAGNOSIS — I739 Peripheral vascular disease, unspecified: Secondary | ICD-10-CM | POA: Diagnosis not present

## 2024-02-19 DIAGNOSIS — G912 (Idiopathic) normal pressure hydrocephalus: Secondary | ICD-10-CM | POA: Diagnosis not present

## 2024-02-19 DIAGNOSIS — E1165 Type 2 diabetes mellitus with hyperglycemia: Secondary | ICD-10-CM | POA: Diagnosis not present

## 2024-02-20 DIAGNOSIS — I739 Peripheral vascular disease, unspecified: Secondary | ICD-10-CM | POA: Diagnosis not present

## 2024-02-20 DIAGNOSIS — E1165 Type 2 diabetes mellitus with hyperglycemia: Secondary | ICD-10-CM | POA: Diagnosis not present

## 2024-02-20 DIAGNOSIS — G912 (Idiopathic) normal pressure hydrocephalus: Secondary | ICD-10-CM | POA: Diagnosis not present

## 2024-02-20 DIAGNOSIS — I10 Essential (primary) hypertension: Secondary | ICD-10-CM | POA: Diagnosis not present

## 2024-02-20 DIAGNOSIS — E785 Hyperlipidemia, unspecified: Secondary | ICD-10-CM | POA: Diagnosis not present

## 2024-02-20 DIAGNOSIS — D72829 Elevated white blood cell count, unspecified: Secondary | ICD-10-CM | POA: Diagnosis not present

## 2024-02-20 DIAGNOSIS — L03115 Cellulitis of right lower limb: Secondary | ICD-10-CM | POA: Diagnosis not present

## 2024-02-22 DIAGNOSIS — E1165 Type 2 diabetes mellitus with hyperglycemia: Secondary | ICD-10-CM | POA: Diagnosis not present

## 2024-02-22 DIAGNOSIS — I10 Essential (primary) hypertension: Secondary | ICD-10-CM | POA: Diagnosis not present

## 2024-02-22 DIAGNOSIS — E785 Hyperlipidemia, unspecified: Secondary | ICD-10-CM | POA: Diagnosis not present

## 2024-02-22 DIAGNOSIS — L03115 Cellulitis of right lower limb: Secondary | ICD-10-CM | POA: Diagnosis not present

## 2024-02-22 DIAGNOSIS — I739 Peripheral vascular disease, unspecified: Secondary | ICD-10-CM | POA: Diagnosis not present

## 2024-02-22 DIAGNOSIS — G912 (Idiopathic) normal pressure hydrocephalus: Secondary | ICD-10-CM | POA: Diagnosis not present

## 2024-02-22 DIAGNOSIS — D72829 Elevated white blood cell count, unspecified: Secondary | ICD-10-CM | POA: Diagnosis not present

## 2024-02-23 DIAGNOSIS — E1165 Type 2 diabetes mellitus with hyperglycemia: Secondary | ICD-10-CM | POA: Diagnosis not present

## 2024-02-23 DIAGNOSIS — I10 Essential (primary) hypertension: Secondary | ICD-10-CM | POA: Diagnosis not present

## 2024-02-23 DIAGNOSIS — D72829 Elevated white blood cell count, unspecified: Secondary | ICD-10-CM | POA: Diagnosis not present

## 2024-02-23 DIAGNOSIS — I739 Peripheral vascular disease, unspecified: Secondary | ICD-10-CM | POA: Diagnosis not present

## 2024-02-23 DIAGNOSIS — L03115 Cellulitis of right lower limb: Secondary | ICD-10-CM | POA: Diagnosis not present

## 2024-02-23 DIAGNOSIS — E785 Hyperlipidemia, unspecified: Secondary | ICD-10-CM | POA: Diagnosis not present

## 2024-02-23 DIAGNOSIS — G912 (Idiopathic) normal pressure hydrocephalus: Secondary | ICD-10-CM | POA: Diagnosis not present

## 2024-02-24 DIAGNOSIS — D72829 Elevated white blood cell count, unspecified: Secondary | ICD-10-CM | POA: Diagnosis not present

## 2024-02-24 DIAGNOSIS — E785 Hyperlipidemia, unspecified: Secondary | ICD-10-CM | POA: Diagnosis not present

## 2024-02-24 DIAGNOSIS — L03115 Cellulitis of right lower limb: Secondary | ICD-10-CM | POA: Diagnosis not present

## 2024-02-24 DIAGNOSIS — E1165 Type 2 diabetes mellitus with hyperglycemia: Secondary | ICD-10-CM | POA: Diagnosis not present

## 2024-02-24 DIAGNOSIS — I10 Essential (primary) hypertension: Secondary | ICD-10-CM | POA: Diagnosis not present

## 2024-02-24 DIAGNOSIS — G912 (Idiopathic) normal pressure hydrocephalus: Secondary | ICD-10-CM | POA: Diagnosis not present

## 2024-02-24 DIAGNOSIS — I739 Peripheral vascular disease, unspecified: Secondary | ICD-10-CM | POA: Diagnosis not present

## 2024-02-25 DIAGNOSIS — E1165 Type 2 diabetes mellitus with hyperglycemia: Secondary | ICD-10-CM | POA: Diagnosis not present

## 2024-02-25 DIAGNOSIS — I10 Essential (primary) hypertension: Secondary | ICD-10-CM | POA: Diagnosis not present

## 2024-02-25 DIAGNOSIS — D72829 Elevated white blood cell count, unspecified: Secondary | ICD-10-CM | POA: Diagnosis not present

## 2024-02-25 DIAGNOSIS — I739 Peripheral vascular disease, unspecified: Secondary | ICD-10-CM | POA: Diagnosis not present

## 2024-02-25 DIAGNOSIS — E785 Hyperlipidemia, unspecified: Secondary | ICD-10-CM | POA: Diagnosis not present

## 2024-02-25 DIAGNOSIS — L03115 Cellulitis of right lower limb: Secondary | ICD-10-CM | POA: Diagnosis not present

## 2024-02-25 DIAGNOSIS — G912 (Idiopathic) normal pressure hydrocephalus: Secondary | ICD-10-CM | POA: Diagnosis not present

## 2024-02-26 DIAGNOSIS — I739 Peripheral vascular disease, unspecified: Secondary | ICD-10-CM | POA: Diagnosis not present

## 2024-02-26 DIAGNOSIS — E1165 Type 2 diabetes mellitus with hyperglycemia: Secondary | ICD-10-CM | POA: Diagnosis not present

## 2024-02-26 DIAGNOSIS — E785 Hyperlipidemia, unspecified: Secondary | ICD-10-CM | POA: Diagnosis not present

## 2024-02-26 DIAGNOSIS — L03115 Cellulitis of right lower limb: Secondary | ICD-10-CM | POA: Diagnosis not present

## 2024-02-26 DIAGNOSIS — I10 Essential (primary) hypertension: Secondary | ICD-10-CM | POA: Diagnosis not present

## 2024-02-26 DIAGNOSIS — D72829 Elevated white blood cell count, unspecified: Secondary | ICD-10-CM | POA: Diagnosis not present

## 2024-02-26 DIAGNOSIS — G912 (Idiopathic) normal pressure hydrocephalus: Secondary | ICD-10-CM | POA: Diagnosis not present

## 2024-02-27 DIAGNOSIS — G912 (Idiopathic) normal pressure hydrocephalus: Secondary | ICD-10-CM | POA: Diagnosis not present

## 2024-02-27 DIAGNOSIS — E1165 Type 2 diabetes mellitus with hyperglycemia: Secondary | ICD-10-CM | POA: Diagnosis not present

## 2024-02-27 DIAGNOSIS — I739 Peripheral vascular disease, unspecified: Secondary | ICD-10-CM | POA: Diagnosis not present

## 2024-02-27 DIAGNOSIS — I10 Essential (primary) hypertension: Secondary | ICD-10-CM | POA: Diagnosis not present

## 2024-02-27 DIAGNOSIS — E785 Hyperlipidemia, unspecified: Secondary | ICD-10-CM | POA: Diagnosis not present

## 2024-02-27 DIAGNOSIS — L03115 Cellulitis of right lower limb: Secondary | ICD-10-CM | POA: Diagnosis not present

## 2024-02-27 DIAGNOSIS — D72829 Elevated white blood cell count, unspecified: Secondary | ICD-10-CM | POA: Diagnosis not present

## 2024-02-28 DIAGNOSIS — I10 Essential (primary) hypertension: Secondary | ICD-10-CM | POA: Diagnosis not present

## 2024-02-28 DIAGNOSIS — G912 (Idiopathic) normal pressure hydrocephalus: Secondary | ICD-10-CM | POA: Diagnosis not present

## 2024-02-28 DIAGNOSIS — E785 Hyperlipidemia, unspecified: Secondary | ICD-10-CM | POA: Diagnosis not present

## 2024-02-28 DIAGNOSIS — D72829 Elevated white blood cell count, unspecified: Secondary | ICD-10-CM | POA: Diagnosis not present

## 2024-02-28 DIAGNOSIS — E1165 Type 2 diabetes mellitus with hyperglycemia: Secondary | ICD-10-CM | POA: Diagnosis not present

## 2024-02-28 DIAGNOSIS — L03115 Cellulitis of right lower limb: Secondary | ICD-10-CM | POA: Diagnosis not present

## 2024-02-28 DIAGNOSIS — I739 Peripheral vascular disease, unspecified: Secondary | ICD-10-CM | POA: Diagnosis not present

## 2024-03-02 DIAGNOSIS — I10 Essential (primary) hypertension: Secondary | ICD-10-CM | POA: Diagnosis not present

## 2024-03-02 DIAGNOSIS — I739 Peripheral vascular disease, unspecified: Secondary | ICD-10-CM | POA: Diagnosis not present

## 2024-03-02 DIAGNOSIS — G912 (Idiopathic) normal pressure hydrocephalus: Secondary | ICD-10-CM | POA: Diagnosis not present

## 2024-03-02 DIAGNOSIS — E119 Type 2 diabetes mellitus without complications: Secondary | ICD-10-CM | POA: Diagnosis not present

## 2024-03-04 DIAGNOSIS — D72829 Elevated white blood cell count, unspecified: Secondary | ICD-10-CM | POA: Diagnosis not present

## 2024-03-04 DIAGNOSIS — E1165 Type 2 diabetes mellitus with hyperglycemia: Secondary | ICD-10-CM | POA: Diagnosis not present

## 2024-03-04 DIAGNOSIS — G912 (Idiopathic) normal pressure hydrocephalus: Secondary | ICD-10-CM | POA: Diagnosis not present

## 2024-03-04 DIAGNOSIS — E785 Hyperlipidemia, unspecified: Secondary | ICD-10-CM | POA: Diagnosis not present

## 2024-03-04 DIAGNOSIS — I10 Essential (primary) hypertension: Secondary | ICD-10-CM | POA: Diagnosis not present

## 2024-03-04 DIAGNOSIS — I739 Peripheral vascular disease, unspecified: Secondary | ICD-10-CM | POA: Diagnosis not present

## 2024-03-04 DIAGNOSIS — L03115 Cellulitis of right lower limb: Secondary | ICD-10-CM | POA: Diagnosis not present

## 2024-03-05 DIAGNOSIS — G912 (Idiopathic) normal pressure hydrocephalus: Secondary | ICD-10-CM | POA: Diagnosis not present

## 2024-03-05 DIAGNOSIS — D72829 Elevated white blood cell count, unspecified: Secondary | ICD-10-CM | POA: Diagnosis not present

## 2024-03-05 DIAGNOSIS — L03115 Cellulitis of right lower limb: Secondary | ICD-10-CM | POA: Diagnosis not present

## 2024-03-05 DIAGNOSIS — I10 Essential (primary) hypertension: Secondary | ICD-10-CM | POA: Diagnosis not present

## 2024-03-05 DIAGNOSIS — I739 Peripheral vascular disease, unspecified: Secondary | ICD-10-CM | POA: Diagnosis not present

## 2024-03-05 DIAGNOSIS — E785 Hyperlipidemia, unspecified: Secondary | ICD-10-CM | POA: Diagnosis not present

## 2024-03-05 DIAGNOSIS — E1165 Type 2 diabetes mellitus with hyperglycemia: Secondary | ICD-10-CM | POA: Diagnosis not present

## 2024-03-06 DIAGNOSIS — L03115 Cellulitis of right lower limb: Secondary | ICD-10-CM | POA: Diagnosis not present

## 2024-03-06 DIAGNOSIS — E785 Hyperlipidemia, unspecified: Secondary | ICD-10-CM | POA: Diagnosis not present

## 2024-03-06 DIAGNOSIS — I10 Essential (primary) hypertension: Secondary | ICD-10-CM | POA: Diagnosis not present

## 2024-03-06 DIAGNOSIS — D72829 Elevated white blood cell count, unspecified: Secondary | ICD-10-CM | POA: Diagnosis not present

## 2024-03-06 DIAGNOSIS — G912 (Idiopathic) normal pressure hydrocephalus: Secondary | ICD-10-CM | POA: Diagnosis not present

## 2024-03-06 DIAGNOSIS — E1165 Type 2 diabetes mellitus with hyperglycemia: Secondary | ICD-10-CM | POA: Diagnosis not present

## 2024-03-06 DIAGNOSIS — I739 Peripheral vascular disease, unspecified: Secondary | ICD-10-CM | POA: Diagnosis not present

## 2024-03-07 DIAGNOSIS — L03115 Cellulitis of right lower limb: Secondary | ICD-10-CM | POA: Diagnosis not present

## 2024-03-07 DIAGNOSIS — D72829 Elevated white blood cell count, unspecified: Secondary | ICD-10-CM | POA: Diagnosis not present

## 2024-03-07 DIAGNOSIS — I739 Peripheral vascular disease, unspecified: Secondary | ICD-10-CM | POA: Diagnosis not present

## 2024-03-07 DIAGNOSIS — G912 (Idiopathic) normal pressure hydrocephalus: Secondary | ICD-10-CM | POA: Diagnosis not present

## 2024-03-07 DIAGNOSIS — I10 Essential (primary) hypertension: Secondary | ICD-10-CM | POA: Diagnosis not present

## 2024-03-07 DIAGNOSIS — E1165 Type 2 diabetes mellitus with hyperglycemia: Secondary | ICD-10-CM | POA: Diagnosis not present

## 2024-03-07 DIAGNOSIS — E785 Hyperlipidemia, unspecified: Secondary | ICD-10-CM | POA: Diagnosis not present

## 2024-03-08 DIAGNOSIS — G912 (Idiopathic) normal pressure hydrocephalus: Secondary | ICD-10-CM | POA: Diagnosis not present

## 2024-03-08 DIAGNOSIS — I739 Peripheral vascular disease, unspecified: Secondary | ICD-10-CM | POA: Diagnosis not present

## 2024-03-08 DIAGNOSIS — E1165 Type 2 diabetes mellitus with hyperglycemia: Secondary | ICD-10-CM | POA: Diagnosis not present

## 2024-03-08 DIAGNOSIS — E785 Hyperlipidemia, unspecified: Secondary | ICD-10-CM | POA: Diagnosis not present

## 2024-03-08 DIAGNOSIS — D72829 Elevated white blood cell count, unspecified: Secondary | ICD-10-CM | POA: Diagnosis not present

## 2024-03-08 DIAGNOSIS — L03115 Cellulitis of right lower limb: Secondary | ICD-10-CM | POA: Diagnosis not present

## 2024-03-08 DIAGNOSIS — I10 Essential (primary) hypertension: Secondary | ICD-10-CM | POA: Diagnosis not present

## 2024-03-09 DIAGNOSIS — E1165 Type 2 diabetes mellitus with hyperglycemia: Secondary | ICD-10-CM | POA: Diagnosis not present

## 2024-03-09 DIAGNOSIS — D72829 Elevated white blood cell count, unspecified: Secondary | ICD-10-CM | POA: Diagnosis not present

## 2024-03-09 DIAGNOSIS — I10 Essential (primary) hypertension: Secondary | ICD-10-CM | POA: Diagnosis not present

## 2024-03-09 DIAGNOSIS — I739 Peripheral vascular disease, unspecified: Secondary | ICD-10-CM | POA: Diagnosis not present

## 2024-03-09 DIAGNOSIS — E785 Hyperlipidemia, unspecified: Secondary | ICD-10-CM | POA: Diagnosis not present

## 2024-03-09 DIAGNOSIS — G912 (Idiopathic) normal pressure hydrocephalus: Secondary | ICD-10-CM | POA: Diagnosis not present

## 2024-03-09 DIAGNOSIS — L03115 Cellulitis of right lower limb: Secondary | ICD-10-CM | POA: Diagnosis not present

## 2024-03-10 DIAGNOSIS — G912 (Idiopathic) normal pressure hydrocephalus: Secondary | ICD-10-CM | POA: Diagnosis not present

## 2024-03-11 DIAGNOSIS — D72829 Elevated white blood cell count, unspecified: Secondary | ICD-10-CM | POA: Diagnosis not present

## 2024-03-11 DIAGNOSIS — G912 (Idiopathic) normal pressure hydrocephalus: Secondary | ICD-10-CM | POA: Diagnosis not present

## 2024-03-11 DIAGNOSIS — E1165 Type 2 diabetes mellitus with hyperglycemia: Secondary | ICD-10-CM | POA: Diagnosis not present

## 2024-03-11 DIAGNOSIS — L03115 Cellulitis of right lower limb: Secondary | ICD-10-CM | POA: Diagnosis not present

## 2024-03-11 DIAGNOSIS — E785 Hyperlipidemia, unspecified: Secondary | ICD-10-CM | POA: Diagnosis not present

## 2024-03-11 DIAGNOSIS — I739 Peripheral vascular disease, unspecified: Secondary | ICD-10-CM | POA: Diagnosis not present

## 2024-03-11 DIAGNOSIS — I10 Essential (primary) hypertension: Secondary | ICD-10-CM | POA: Diagnosis not present

## 2024-03-14 DIAGNOSIS — E1165 Type 2 diabetes mellitus with hyperglycemia: Secondary | ICD-10-CM | POA: Diagnosis not present

## 2024-03-14 DIAGNOSIS — I10 Essential (primary) hypertension: Secondary | ICD-10-CM | POA: Diagnosis not present

## 2024-03-14 DIAGNOSIS — D72829 Elevated white blood cell count, unspecified: Secondary | ICD-10-CM | POA: Diagnosis not present

## 2024-03-14 DIAGNOSIS — E785 Hyperlipidemia, unspecified: Secondary | ICD-10-CM | POA: Diagnosis not present

## 2024-03-14 DIAGNOSIS — G912 (Idiopathic) normal pressure hydrocephalus: Secondary | ICD-10-CM | POA: Diagnosis not present

## 2024-03-14 DIAGNOSIS — I739 Peripheral vascular disease, unspecified: Secondary | ICD-10-CM | POA: Diagnosis not present

## 2024-03-14 DIAGNOSIS — L03115 Cellulitis of right lower limb: Secondary | ICD-10-CM | POA: Diagnosis not present
